# Patient Record
Sex: Male | Born: 1935 | Race: White | Hispanic: No | Marital: Married | Smoking: Former smoker
Health system: Southern US, Community
[De-identification: ages and names within clinical notes are randomized; demographics above are authoritative.]

## PROBLEM LIST (undated history)

## (undated) DIAGNOSIS — I10 Essential (primary) hypertension: Secondary | ICD-10-CM

## (undated) DIAGNOSIS — Z950 Presence of cardiac pacemaker: Secondary | ICD-10-CM

## (undated) DIAGNOSIS — R001 Bradycardia, unspecified: Secondary | ICD-10-CM

## (undated) DIAGNOSIS — F32A Depression, unspecified: Secondary | ICD-10-CM

## (undated) DIAGNOSIS — G20 Parkinson's disease: Secondary | ICD-10-CM

## (undated) DIAGNOSIS — G20A1 Parkinson's disease without dyskinesia, without mention of fluctuations: Secondary | ICD-10-CM

## (undated) DIAGNOSIS — F329 Major depressive disorder, single episode, unspecified: Secondary | ICD-10-CM

## (undated) DIAGNOSIS — I499 Cardiac arrhythmia, unspecified: Secondary | ICD-10-CM

## (undated) DIAGNOSIS — E039 Hypothyroidism, unspecified: Secondary | ICD-10-CM

## (undated) DIAGNOSIS — N189 Chronic kidney disease, unspecified: Secondary | ICD-10-CM

## (undated) DIAGNOSIS — R42 Dizziness and giddiness: Secondary | ICD-10-CM

## (undated) DIAGNOSIS — M1990 Unspecified osteoarthritis, unspecified site: Secondary | ICD-10-CM

## (undated) HISTORY — DX: Dizziness and giddiness: R42

## (undated) HISTORY — PX: HERNIA REPAIR: SHX51

## (undated) HISTORY — PX: CARDIAC CATHETERIZATION: SHX172

## (undated) HISTORY — PX: KNEE SURGERY: SHX244

## (undated) HISTORY — PX: JOINT REPLACEMENT: SHX530

## (undated) HISTORY — PX: SHOULDER SURGERY: SHX246

## (undated) HISTORY — DX: Bradycardia, unspecified: R00.1

## (undated) HISTORY — PX: CATARACT EXTRACTION: SUR2

## (undated) HISTORY — DX: Hypothyroidism, unspecified: E03.9

## (undated) HISTORY — PX: APPENDECTOMY: SHX54

## (undated) HISTORY — PX: INSERT / REPLACE / REMOVE PACEMAKER: SUR710

## (undated) HISTORY — DX: Unspecified osteoarthritis, unspecified site: M19.90

## (undated) HISTORY — PX: OTHER SURGICAL HISTORY: SHX169

---

## 2004-08-19 ENCOUNTER — Ambulatory Visit: Payer: Self-pay | Admitting: Internal Medicine

## 2004-08-20 ENCOUNTER — Ambulatory Visit: Payer: Self-pay | Admitting: Internal Medicine

## 2007-01-10 ENCOUNTER — Emergency Department: Payer: Self-pay | Admitting: Emergency Medicine

## 2007-01-20 ENCOUNTER — Ambulatory Visit: Payer: Self-pay | Admitting: Internal Medicine

## 2007-01-22 ENCOUNTER — Ambulatory Visit: Payer: Self-pay | Admitting: Physician Assistant

## 2007-10-27 ENCOUNTER — Inpatient Hospital Stay: Payer: Self-pay | Admitting: Internal Medicine

## 2007-10-27 ENCOUNTER — Other Ambulatory Visit: Payer: Self-pay

## 2007-10-28 ENCOUNTER — Other Ambulatory Visit: Payer: Self-pay

## 2007-11-02 ENCOUNTER — Other Ambulatory Visit: Payer: Self-pay

## 2007-11-21 ENCOUNTER — Emergency Department: Payer: Self-pay

## 2007-11-21 ENCOUNTER — Other Ambulatory Visit: Payer: Self-pay

## 2009-04-08 ENCOUNTER — Inpatient Hospital Stay: Payer: Self-pay | Admitting: Internal Medicine

## 2011-03-04 ENCOUNTER — Ambulatory Visit: Payer: Self-pay | Admitting: Physician Assistant

## 2011-04-21 ENCOUNTER — Emergency Department: Payer: Self-pay | Admitting: *Deleted

## 2011-04-21 LAB — URINALYSIS, COMPLETE
Bilirubin,UR: NEGATIVE
Blood: NEGATIVE
Leukocyte Esterase: NEGATIVE
Ph: 6 (ref 4.5–8.0)
Protein: NEGATIVE
Squamous Epithelial: 1

## 2011-04-21 LAB — CBC
HCT: 40.9 % (ref 40.0–52.0)
Platelet: 118 10*3/uL — ABNORMAL LOW (ref 150–440)
WBC: 6.5 10*3/uL (ref 3.8–10.6)

## 2011-04-21 LAB — CK TOTAL AND CKMB (NOT AT ARMC)
CK, Total: 83 U/L (ref 35–232)
CK-MB: 0.5 ng/mL (ref 0.5–3.6)

## 2011-04-21 LAB — COMPREHENSIVE METABOLIC PANEL
Anion Gap: 12 (ref 7–16)
Bilirubin,Total: 0.5 mg/dL (ref 0.2–1.0)
Calcium, Total: 9.3 mg/dL (ref 8.5–10.1)
Chloride: 102 mmol/L (ref 98–107)
Co2: 25 mmol/L (ref 21–32)
EGFR (African American): 51 — ABNORMAL LOW
EGFR (Non-African Amer.): 42 — ABNORMAL LOW
Osmolality: 285 (ref 275–301)
Potassium: 4.5 mmol/L (ref 3.5–5.1)
Sodium: 139 mmol/L (ref 136–145)

## 2011-04-21 LAB — TROPONIN I: Troponin-I: <0.02 ng/mL

## 2011-07-15 ENCOUNTER — Other Ambulatory Visit: Payer: Self-pay | Admitting: Neurological Surgery

## 2011-07-15 DIAGNOSIS — M4800 Spinal stenosis, site unspecified: Secondary | ICD-10-CM

## 2011-07-15 DIAGNOSIS — M545 Low back pain: Secondary | ICD-10-CM

## 2011-07-21 ENCOUNTER — Ambulatory Visit
Admission: RE | Admit: 2011-07-21 | Discharge: 2011-07-21 | Disposition: A | Discharge status: Home / Self Care | Payer: No Typology Code available for payment source | Source: Ambulatory Visit | Attending: Neurological Surgery | Admitting: Neurological Surgery

## 2011-07-21 ENCOUNTER — Ambulatory Visit
Admission: RE | Admit: 2011-07-21 | Discharge: 2011-07-21 | Disposition: A | Discharge status: Home / Self Care | Payer: Medicare Other | Source: Ambulatory Visit | Attending: Neurological Surgery | Admitting: Neurological Surgery

## 2011-07-21 VITALS — BP 141/86 | HR 71

## 2011-07-21 DIAGNOSIS — M545 Low back pain: Secondary | ICD-10-CM

## 2011-07-21 DIAGNOSIS — M4800 Spinal stenosis, site unspecified: Secondary | ICD-10-CM

## 2011-07-21 MED ORDER — IOHEXOL 180 MG/ML  SOLN
17.0000 mL | Freq: Once | INTRAMUSCULAR | Status: AC | PRN
  Administered 2011-07-21: 17 mL via INTRATHECAL

## 2011-07-21 MED ORDER — DIAZEPAM 5 MG PO TABS
5.0000 mg | ORAL_TABLET | Freq: Once | ORAL | Status: AC
  Administered 2011-07-21: 5 mg via ORAL

## 2011-07-21 NOTE — Progress Notes (Addendum)
Patient states he has been off Buspirone, Citalopram and Tramadol for the past two days.  Procedure and discharge instructions discussed with patient. jkl

## 2011-07-21 NOTE — Discharge Instructions (Signed)
Myelogram Discharge Instructions  1. Go home and rest quietly for the next 24 hours.  It is important to lie flat for the next 24 hours.  Get up only to go to the restroom.  You may lie in the bed or on a couch on your back, your stomach, your left side or your right side.  You may have one pillow under your head.  You may have pillows between your knees while you are on your side or under your knees while you are on your back.  2. DO NOT drive today.  Recline the seat as far back as it will go, while still wearing your seat belt, on the way home.  3. You may get up to go to the bathroom as needed.  You may sit up for 10 minutes to eat.  You may resume your normal diet and medications unless otherwise indicated.  Drink lots of extra fluids today and tomorrow.  4. The incidence of headache, nausea, or vomiting is about 5% (one in 20 patients).  If you develop a headache, lie flat and drink plenty of fluids until the headache goes away.  Caffeinated beverages may be helpful.  If you develop severe nausea and vomiting or a headache that does not go away with flat bed rest, call [phone].  5. You may resume normal activities after your 24 hours of bed rest is over; however, do not exert yourself strongly or do any heavy lifting tomorrow. If when you get up you have a headache when standing, go back to bed and force fluids for another 24 hours.  6. Call your physician for a follow-up appointment.  The results of your myelogram will be sent directly to your physician by the following day.  7. If you have any questions or if complications develop after you arrive home, please call [phone].  Discharge instructions have been explained to the patient.  The patient, or the person responsible for the patient, fully understands these instructions.       May resume buspirone, citalopram and tramadol on Jul 22, 2011, after 9:30 am.

## 2011-08-03 ENCOUNTER — Other Ambulatory Visit: Payer: Self-pay | Admitting: Neurological Surgery

## 2011-08-03 ENCOUNTER — Encounter (HOSPITAL_COMMUNITY): Payer: Self-pay

## 2011-08-05 ENCOUNTER — Encounter (HOSPITAL_COMMUNITY)
Admission: RE | Admit: 2011-08-05 | Discharge: 2011-08-05 | Disposition: A | Discharge status: Home / Self Care | Payer: Medicare Other | Source: Ambulatory Visit | Attending: Neurological Surgery | Admitting: Neurological Surgery

## 2011-08-05 ENCOUNTER — Encounter (HOSPITAL_COMMUNITY): Payer: Self-pay

## 2011-08-05 HISTORY — DX: Cardiac arrhythmia, unspecified: I49.9

## 2011-08-05 HISTORY — DX: Presence of cardiac pacemaker: Z95.0

## 2011-08-05 HISTORY — DX: Chronic kidney disease, unspecified: N18.9

## 2011-08-05 HISTORY — DX: Major depressive disorder, single episode, unspecified: F32.9

## 2011-08-05 HISTORY — DX: Essential (primary) hypertension: I10

## 2011-08-05 HISTORY — DX: Hypothyroidism, unspecified: E03.9

## 2011-08-05 HISTORY — DX: Depression, unspecified: F32.A

## 2011-08-05 LAB — BASIC METABOLIC PANEL
Chloride: 104 mEq/L (ref 96–112)
Creatinine, Ser: 1.16 mg/dL (ref 0.50–1.35)
GFR calc Af Amer: 68 mL/min — ABNORMAL LOW (ref >90)
GFR calc non Af Amer: 59 mL/min — ABNORMAL LOW (ref >90)
Potassium: 4.1 mEq/L (ref 3.5–5.1)

## 2011-08-05 LAB — CBC
MCHC: 35.2 g/dL (ref 30.0–36.0)
RDW: 13.1 % (ref 11.5–15.5)

## 2011-08-05 LAB — DIFFERENTIAL
Basophils Absolute: 0 10*3/uL (ref 0.0–0.1)
Basophils Relative: 0 % (ref 0–1)
Eosinophils Absolute: 0.3 10*3/uL (ref 0.0–0.7)
Monocytes Absolute: 0.3 10*3/uL (ref 0.1–1.0)
Neutro Abs: 3.1 10*3/uL (ref 1.7–7.7)
Neutrophils Relative %: 51 % (ref 43–77)

## 2011-08-05 LAB — SURGICAL PCR SCREEN: Staphylococcus aureus: NEGATIVE

## 2011-08-05 LAB — PROTIME-INR: Prothrombin Time: 14.4 seconds (ref 11.6–15.2)

## 2011-08-05 MED ORDER — CEFAZOLIN SODIUM-DEXTROSE 2-3 GM-% IV SOLR
2.0000 g | INTRAVENOUS | Status: AC
  Administered 2011-08-06: 2 g via INTRAVENOUS
  Filled 2011-08-05: qty 50

## 2011-08-05 NOTE — Pre-Procedure Instructions (Signed)
[address] ELMAN DETTMAN  08/05/2011   Your procedure is scheduled on:  08/06/11  Report to [facility] at 1240 pm  Call this number if you have problems the morning of surgery: [phone]   Remember:   Do not eat food:After Midnight.  May have clear liquids: up to 4 Hours before arrival.  Clear liquids include soda, tea, black coffee, apple or grape juice, broth.  Take these medicines the morning of surgery with A SIP OF WATER: buspar,celexa,lopid,synthroid,flomax   Do not wear jewelry, make-up or nail polish.  Do not wear lotions, powders, or perfumes. You may wear deodorant.  Do not shave 48 hours prior to surgery. Men may shave face and neck.  Do not bring valuables to the hospital.  Contacts, dentures or bridgework may not be worn into surgery.  Leave suitcase in the car. After surgery it may be brought to your room.  For patients admitted to the hospital, checkout time is 11:00 AM the day of discharge.   Patients discharged the day of surgery will not be allowed to drive home.  Name and phone number of your driver: family  Special Instructions: CHG Shower Use Special Wash: 1/2 bottle night before surgery and 1/2 bottle morning of surgery.   Please read over the following fact sheets that you were given: Pain Booklet, Coughing and Deep Breathing, MRSA Information and Surgical Site Infection Prevention

## 2011-08-05 NOTE — Progress Notes (Signed)
Dr Lady Gary notified for current cardiac studies. Form faxed and [facility] notified.

## 2011-08-06 ENCOUNTER — Inpatient Hospital Stay (HOSPITAL_COMMUNITY): Payer: Medicare Other | Admitting: Certified Registered"

## 2011-08-06 ENCOUNTER — Encounter (HOSPITAL_COMMUNITY): Payer: Self-pay | Admitting: Certified Registered"

## 2011-08-06 ENCOUNTER — Encounter (HOSPITAL_COMMUNITY): Payer: Self-pay | Admitting: *Deleted

## 2011-08-06 ENCOUNTER — Inpatient Hospital Stay (HOSPITAL_COMMUNITY)
Admission: RE | Admit: 2011-08-06 | Discharge: 2011-08-08 | DRG: 491 | Disposition: A | Discharge status: Home / Self Care | Payer: Medicare Other | Source: Ambulatory Visit | Attending: Neurological Surgery | Admitting: Neurological Surgery

## 2011-08-06 ENCOUNTER — Encounter (HOSPITAL_COMMUNITY): Payer: Self-pay | Admitting: Neurological Surgery

## 2011-08-06 ENCOUNTER — Encounter (HOSPITAL_COMMUNITY)
Admission: RE | Disposition: A | Discharge status: Home / Self Care | Payer: Self-pay | Source: Ambulatory Visit | Attending: Neurological Surgery

## 2011-08-06 ENCOUNTER — Inpatient Hospital Stay (HOSPITAL_COMMUNITY): Payer: Medicare Other

## 2011-08-06 DIAGNOSIS — N189 Chronic kidney disease, unspecified: Secondary | ICD-10-CM | POA: Diagnosis present

## 2011-08-06 DIAGNOSIS — I129 Hypertensive chronic kidney disease with stage 1 through stage 4 chronic kidney disease, or unspecified chronic kidney disease: Secondary | ICD-10-CM | POA: Diagnosis present

## 2011-08-06 DIAGNOSIS — Z79899 Other long term (current) drug therapy: Secondary | ICD-10-CM

## 2011-08-06 DIAGNOSIS — M48061 Spinal stenosis, lumbar region without neurogenic claudication: Principal | ICD-10-CM | POA: Diagnosis present

## 2011-08-06 DIAGNOSIS — Z01812 Encounter for preprocedural laboratory examination: Secondary | ICD-10-CM

## 2011-08-06 DIAGNOSIS — F329 Major depressive disorder, single episode, unspecified: Secondary | ICD-10-CM | POA: Diagnosis present

## 2011-08-06 DIAGNOSIS — Z8673 Personal history of transient ischemic attack (TIA), and cerebral infarction without residual deficits: Secondary | ICD-10-CM

## 2011-08-06 DIAGNOSIS — Z950 Presence of cardiac pacemaker: Secondary | ICD-10-CM

## 2011-08-06 DIAGNOSIS — Z7982 Long term (current) use of aspirin: Secondary | ICD-10-CM

## 2011-08-06 DIAGNOSIS — F3289 Other specified depressive episodes: Secondary | ICD-10-CM | POA: Diagnosis present

## 2011-08-06 DIAGNOSIS — Z01810 Encounter for preprocedural cardiovascular examination: Secondary | ICD-10-CM

## 2011-08-06 DIAGNOSIS — E039 Hypothyroidism, unspecified: Secondary | ICD-10-CM | POA: Diagnosis present

## 2011-08-06 DIAGNOSIS — Z87891 Personal history of nicotine dependence: Secondary | ICD-10-CM

## 2011-08-06 HISTORY — PX: LUMBAR LAMINECTOMY/DECOMPRESSION MICRODISCECTOMY: SHX5026

## 2011-08-06 SURGERY — LUMBAR LAMINECTOMY/DECOMPRESSION MICRODISCECTOMY 2 LEVELS
Anesthesia: General | Site: Back

## 2011-08-06 MED ORDER — BUPIVACAINE HCL (PF) 0.25 % IJ SOLN
INTRAMUSCULAR | Status: DC | PRN
  Administered 2011-08-06: 6 mL

## 2011-08-06 MED ORDER — SODIUM CHLORIDE 0.9 % IJ SOLN: 3.0000 mL | INTRAMUSCULAR | Status: DC | PRN

## 2011-08-06 MED ORDER — ACETAMINOPHEN 650 MG RE SUPP: 650.0000 mg | RECTAL | Status: DC | PRN

## 2011-08-06 MED ORDER — EPHEDRINE SULFATE 50 MG/ML IJ SOLN
INTRAMUSCULAR | Status: DC | PRN
  Administered 2011-08-06: 5 mg via INTRAVENOUS
  Administered 2011-08-06: 10 mg via INTRAVENOUS
  Administered 2011-08-06 (×2): 5 mg via INTRAVENOUS
  Administered 2011-08-06: 10 mg via INTRAVENOUS

## 2011-08-06 MED ORDER — SENNA 8.6 MG PO TABS
1.0000 | ORAL_TABLET | Freq: Two times a day (BID) | ORAL | Status: DC
  Administered 2011-08-06 – 2011-08-08 (×4): 8.6 mg via ORAL
  Filled 2011-08-06 (×5): qty 1

## 2011-08-06 MED ORDER — ONDANSETRON HCL 4 MG/2ML IJ SOLN
INTRAMUSCULAR | Status: DC | PRN
  Administered 2011-08-06: 4 mg via INTRAVENOUS

## 2011-08-06 MED ORDER — HYDROCHLOROTHIAZIDE 25 MG PO TABS
12.5000 mg | ORAL_TABLET | Freq: Every day | ORAL | Status: DC
  Administered 2011-08-06 – 2011-08-07 (×2): 12.5 mg via ORAL
  Filled 2011-08-06 (×3): qty 0.5

## 2011-08-06 MED ORDER — FENTANYL CITRATE 0.05 MG/ML IJ SOLN
INTRAMUSCULAR | Status: DC | PRN
  Administered 2011-08-06: 50 ug via INTRAVENOUS
  Administered 2011-08-06 (×2): 100 ug via INTRAVENOUS

## 2011-08-06 MED ORDER — LACTATED RINGERS IV SOLN
INTRAVENOUS | Status: DC | PRN
  Administered 2011-08-06 (×2): via INTRAVENOUS

## 2011-08-06 MED ORDER — ASPIRIN EC 325 MG PO TBEC
325.0000 mg | DELAYED_RELEASE_TABLET | Freq: Every day | ORAL | Status: DC
  Administered 2011-08-06 – 2011-08-08 (×3): 325 mg via ORAL
  Filled 2011-08-06 (×5): qty 1

## 2011-08-06 MED ORDER — HYDROMORPHONE HCL PF 1 MG/ML IJ SOLN
INTRAMUSCULAR | Status: AC
  Filled 2011-08-06: qty 1

## 2011-08-06 MED ORDER — SODIUM CHLORIDE 0.9 % IV SOLN
INTRAVENOUS | Status: AC
  Filled 2011-08-06: qty 500

## 2011-08-06 MED ORDER — NEOSTIGMINE METHYLSULFATE 1 MG/ML IJ SOLN
INTRAMUSCULAR | Status: DC | PRN
  Administered 2011-08-06: 5 mg via INTRAVENOUS

## 2011-08-06 MED ORDER — 0.9 % SODIUM CHLORIDE (POUR BTL) OPTIME
TOPICAL | Status: DC | PRN
  Administered 2011-08-06: 1000 mL

## 2011-08-06 MED ORDER — LISINOPRIL 10 MG PO TABS
10.0000 mg | ORAL_TABLET | Freq: Every day | ORAL | Status: DC
  Administered 2011-08-06 – 2011-08-08 (×3): 10 mg via ORAL
  Filled 2011-08-06 (×3): qty 1

## 2011-08-06 MED ORDER — HEMOSTATIC AGENTS (NO CHARGE) OPTIME
TOPICAL | Status: DC | PRN
  Administered 2011-08-06: 1 via TOPICAL

## 2011-08-06 MED ORDER — DEXAMETHASONE SODIUM PHOSPHATE 4 MG/ML IJ SOLN
4.0000 mg | Freq: Four times a day (QID) | INTRAMUSCULAR | Status: DC
  Administered 2011-08-08: 4 mg via INTRAVENOUS
  Filled 2011-08-06 (×8): qty 1

## 2011-08-06 MED ORDER — PROPOFOL 10 MG/ML IV EMUL
INTRAVENOUS | Status: DC | PRN
  Administered 2011-08-06: 120 mg via INTRAVENOUS

## 2011-08-06 MED ORDER — LIDOCAINE HCL (CARDIAC) 20 MG/ML IV SOLN
INTRAVENOUS | Status: DC | PRN
  Administered 2011-08-06: 40 mg via INTRAVENOUS

## 2011-08-06 MED ORDER — MIDAZOLAM HCL 5 MG/5ML IJ SOLN
INTRAMUSCULAR | Status: DC | PRN
  Administered 2011-08-06: 1 mg via INTRAVENOUS

## 2011-08-06 MED ORDER — MENTHOL 3 MG MT LOZG: 1.0000 | LOZENGE | OROMUCOSAL | Status: DC | PRN

## 2011-08-06 MED ORDER — POTASSIUM CHLORIDE IN NACL 20-0.9 MEQ/L-% IV SOLN
INTRAVENOUS | Status: DC
  Administered 2011-08-06: 20:00:00 via INTRAVENOUS
  Filled 2011-08-06 (×6): qty 1000

## 2011-08-06 MED ORDER — DEXAMETHASONE 4 MG PO TABS
4.0000 mg | ORAL_TABLET | Freq: Four times a day (QID) | ORAL | Status: DC
  Administered 2011-08-06 – 2011-08-08 (×7): 4 mg via ORAL
  Filled 2011-08-06 (×11): qty 1

## 2011-08-06 MED ORDER — SODIUM CHLORIDE 0.9 % IR SOLN
Status: DC | PRN
  Administered 2011-08-06: 15:00:00

## 2011-08-06 MED ORDER — CEFAZOLIN SODIUM 1-5 GM-% IV SOLN
1.0000 g | Freq: Three times a day (TID) | INTRAVENOUS | Status: AC
  Administered 2011-08-06 – 2011-08-07 (×2): 1 g via INTRAVENOUS
  Filled 2011-08-06 (×3): qty 50

## 2011-08-06 MED ORDER — THROMBIN 5000 UNITS EX SOLR
OROMUCOSAL | Status: DC | PRN
  Administered 2011-08-06: 15:00:00 via TOPICAL

## 2011-08-06 MED ORDER — BUSPIRONE HCL 5 MG PO TABS
5.0000 mg | ORAL_TABLET | Freq: Three times a day (TID) | ORAL | Status: DC
  Administered 2011-08-06 – 2011-08-08 (×6): 5 mg via ORAL
  Filled 2011-08-06 (×8): qty 1

## 2011-08-06 MED ORDER — ONDANSETRON HCL 4 MG/2ML IJ SOLN
4.0000 mg | INTRAMUSCULAR | Status: DC | PRN
  Administered 2011-08-08 (×2): 4 mg via INTRAVENOUS
  Filled 2011-08-06 (×2): qty 2

## 2011-08-06 MED ORDER — LEVOTHYROXINE SODIUM 75 MCG PO TABS
75.0000 ug | ORAL_TABLET | Freq: Every day | ORAL | Status: DC
  Administered 2011-08-06 – 2011-08-08 (×3): 75 ug via ORAL
  Filled 2011-08-06 (×3): qty 1

## 2011-08-06 MED ORDER — PHENYLEPHRINE HCL 10 MG/ML IJ SOLN
10.0000 mg | INTRAVENOUS | Status: DC | PRN
  Administered 2011-08-06: 10 ug/min via INTRAVENOUS

## 2011-08-06 MED ORDER — ACETAMINOPHEN 325 MG PO TABS: 650.0000 mg | ORAL_TABLET | ORAL | Status: DC | PRN

## 2011-08-06 MED ORDER — THROMBIN 5000 UNITS EX SOLR
CUTANEOUS | Status: DC | PRN
  Administered 2011-08-06 (×2): 5000 [IU] via TOPICAL

## 2011-08-06 MED ORDER — TAMSULOSIN HCL 0.4 MG PO CAPS
0.4000 mg | ORAL_CAPSULE | Freq: Every day | ORAL | Status: DC
  Administered 2011-08-06 – 2011-08-08 (×3): 0.4 mg via ORAL
  Filled 2011-08-06 (×3): qty 1

## 2011-08-06 MED ORDER — GLYCOPYRROLATE 0.2 MG/ML IJ SOLN
INTRAMUSCULAR | Status: DC | PRN
  Administered 2011-08-06: .8 mg via INTRAVENOUS

## 2011-08-06 MED ORDER — SODIUM CHLORIDE 0.9 % IJ SOLN
3.0000 mL | Freq: Two times a day (BID) | INTRAMUSCULAR | Status: DC
  Administered 2011-08-06 – 2011-08-08 (×4): 3 mL via INTRAVENOUS

## 2011-08-06 MED ORDER — HYDROCODONE-ACETAMINOPHEN 5-325 MG PO TABS
1.0000 | ORAL_TABLET | ORAL | Status: DC | PRN
  Administered 2011-08-06 – 2011-08-08 (×7): 2 via ORAL
  Filled 2011-08-06 (×8): qty 2

## 2011-08-06 MED ORDER — BACITRACIN 50000 UNITS IM SOLR
INTRAMUSCULAR | Status: AC
  Filled 2011-08-06: qty 1

## 2011-08-06 MED ORDER — ROCURONIUM BROMIDE 100 MG/10ML IV SOLN
INTRAVENOUS | Status: DC | PRN
  Administered 2011-08-06: 10 mg via INTRAVENOUS
  Administered 2011-08-06: 50 mg via INTRAVENOUS

## 2011-08-06 MED ORDER — CITALOPRAM HYDROBROMIDE 10 MG PO TABS
10.0000 mg | ORAL_TABLET | Freq: Every day | ORAL | Status: DC
  Administered 2011-08-06 – 2011-08-08 (×3): 10 mg via ORAL
  Filled 2011-08-06 (×3): qty 1

## 2011-08-06 MED ORDER — PHENOL 1.4 % MT LIQD: 1.0000 | OROMUCOSAL | Status: DC | PRN

## 2011-08-06 MED ORDER — MORPHINE SULFATE 2 MG/ML IJ SOLN
1.0000 mg | INTRAMUSCULAR | Status: DC | PRN
  Administered 2011-08-06: 4 mg via INTRAVENOUS
  Administered 2011-08-06: 2 mg via INTRAVENOUS
  Filled 2011-08-06: qty 2
  Filled 2011-08-06: qty 1

## 2011-08-06 MED ORDER — HYDROMORPHONE HCL PF 1 MG/ML IJ SOLN
0.2500 mg | INTRAMUSCULAR | Status: DC | PRN
  Administered 2011-08-06: 0.25 mg via INTRAVENOUS

## 2011-08-06 MED ORDER — ONDANSETRON HCL 4 MG/2ML IJ SOLN: 4.0000 mg | Freq: Once | INTRAMUSCULAR | Status: DC | PRN

## 2011-08-06 SURGICAL SUPPLY — 47 items
APL SKNCLS STERI-STRIP NONHPOA (GAUZE/BANDAGES/DRESSINGS) ×1
BAG DECANTER FOR FLEXI CONT (MISCELLANEOUS) ×2 IMPLANT
BENZOIN TINCTURE PRP APPL 2/3 (GAUZE/BANDAGES/DRESSINGS) ×2 IMPLANT
BUR MATCHSTICK NEURO 3.0 LAGG (BURR) ×2 IMPLANT
CANISTER SUCTION 2500CC (MISCELLANEOUS) ×2 IMPLANT
CLOTH BEACON ORANGE TIMEOUT ST (SAFETY) ×2 IMPLANT
CONT SPEC 4OZ CLIKSEAL STRL BL (MISCELLANEOUS) ×2 IMPLANT
DRAPE LAPAROTOMY 100X72X124 (DRAPES) ×2 IMPLANT
DRAPE MICROSCOPE ZEISS OPMI (DRAPES) ×1 IMPLANT
DRAPE POUCH INSTRU U-SHP 10X18 (DRAPES) ×2 IMPLANT
DRAPE SURG 17X23 STRL (DRAPES) ×2 IMPLANT
DRESSING TELFA 8X3 (GAUZE/BANDAGES/DRESSINGS) ×2 IMPLANT
DRSG OPSITE 4X5.5 SM (GAUZE/BANDAGES/DRESSINGS) ×2 IMPLANT
DURAPREP 26ML APPLICATOR (WOUND CARE) ×2 IMPLANT
ELECT REM PT RETURN 9FT ADLT (ELECTROSURGICAL) ×2
ELECTRODE REM PT RTRN 9FT ADLT (ELECTROSURGICAL) ×1 IMPLANT
GAUZE SPONGE 4X4 16PLY XRAY LF (GAUZE/BANDAGES/DRESSINGS) IMPLANT
GLOVE BIO SURGEON STRL SZ8 (GLOVE) ×2 IMPLANT
GLOVE BIOGEL PI IND STRL 7.0 (GLOVE) IMPLANT
GLOVE BIOGEL PI INDICATOR 7.0 (GLOVE) ×1
GLOVE SURG SS PI 6.5 STRL IVOR (GLOVE) ×2 IMPLANT
GOWN BRE IMP SLV AUR LG STRL (GOWN DISPOSABLE) ×2 IMPLANT
GOWN BRE IMP SLV AUR XL STRL (GOWN DISPOSABLE) ×2 IMPLANT
GOWN STRL REIN 2XL LVL4 (GOWN DISPOSABLE) IMPLANT
HEMOSTAT POWDER KIT SURGIFOAM (HEMOSTASIS) IMPLANT
KIT BASIN OR (CUSTOM PROCEDURE TRAY) ×2 IMPLANT
KIT ROOM TURNOVER OR (KITS) ×2 IMPLANT
NDL HYPO 25X1 1.5 SAFETY (NEEDLE) ×1 IMPLANT
NDL SPNL 20GX3.5 QUINCKE YW (NEEDLE) IMPLANT
NEEDLE HYPO 22GX1.5 SAFETY (NEEDLE) ×1 IMPLANT
NEEDLE HYPO 25X1 1.5 SAFETY (NEEDLE) ×2 IMPLANT
NEEDLE SPNL 20GX3.5 QUINCKE YW (NEEDLE) ×2 IMPLANT
NS IRRIG 1000ML POUR BTL (IV SOLUTION) ×2 IMPLANT
PACK LAMINECTOMY NEURO (CUSTOM PROCEDURE TRAY) ×2 IMPLANT
PAD ARMBOARD 7.5X6 YLW CONV (MISCELLANEOUS) ×5 IMPLANT
RUBBERBAND STERILE (MISCELLANEOUS) ×2 IMPLANT
SPONGE SURGIFOAM ABS GEL SZ50 (HEMOSTASIS) ×2 IMPLANT
STRIP CLOSURE SKIN 1/2X4 (GAUZE/BANDAGES/DRESSINGS) ×2 IMPLANT
STRIP CLOSURE SKIN 1/4X4 (GAUZE/BANDAGES/DRESSINGS) ×1 IMPLANT
SUT VIC AB 0 CT1 18XCR BRD8 (SUTURE) ×1 IMPLANT
SUT VIC AB 0 CT1 8-18 (SUTURE) ×2
SUT VIC AB 2-0 CP2 18 (SUTURE) ×2 IMPLANT
SUT VIC AB 3-0 SH 8-18 (SUTURE) ×2 IMPLANT
SYR 20ML ECCENTRIC (SYRINGE) ×2 IMPLANT
TOWEL OR 17X24 6PK STRL BLUE (TOWEL DISPOSABLE) ×2 IMPLANT
TOWEL OR 17X26 10 PK STRL BLUE (TOWEL DISPOSABLE) ×2 IMPLANT
WATER STERILE IRR 1000ML POUR (IV SOLUTION) ×2 IMPLANT

## 2011-08-06 NOTE — Anesthesia Preprocedure Evaluation (Addendum)
Anesthesia Evaluation  Patient identified by MRN, date of birth, ID band Patient awake    Reviewed: Allergy & Precautions, NPO status   Airway Mallampati: I  Neck ROM: Full    Dental  (+) Edentulous Upper, Edentulous Lower and Dental Advisory Given   Pulmonary  breath sounds clear to auscultation        Cardiovascular hypertension, Pt. on medications + dysrhythmias + pacemaker Rhythm:Regular Rate:Normal     Neuro/Psych PSYCHIATRIC DISORDERS Depression    GI/Hepatic   Endo/Other  Hypothyroidism   Renal/GU      Musculoskeletal   Abdominal   Peds  Hematology   Anesthesia Other Findings   Reproductive/Obstetrics                         Anesthesia Physical Anesthesia Plan  ASA: III  Anesthesia Plan: General   Post-op Pain Management:    Induction: Intravenous  Airway Management Planned: Oral ETT  Additional Equipment:   Intra-op Plan:   Post-operative Plan: Extubation in OR  Informed Consent: I have reviewed the patients History and Physical, chart, labs and discussed the procedure including the risks, benefits and alternatives for the proposed anesthesia with the patient or authorized representative who has indicated his/her understanding and acceptance.   Dental advisory given  Plan Discussed with:   Anesthesia Plan Comments: (Afib Htn Lumbar spinal stenosis  Plan GA)        Anesthesia Quick Evaluation

## 2011-08-06 NOTE — Preoperative (Signed)
Beta Blockers   Reason not to administer Beta Blockers:Not Applicable 

## 2011-08-06 NOTE — Anesthesia Postprocedure Evaluation (Signed)
  Anesthesia Post-op Note  Patient: Lawrence Valdez  Procedure(s) Performed: Procedure(s) (LRB): LUMBAR LAMINECTOMY/DECOMPRESSION MICRODISCECTOMY 2 LEVELS (N/A)  Patient Location: PACU  Anesthesia Type: General  Level of Consciousness: awake, alert  and oriented  Airway and Oxygen Therapy: Patient Spontanous Breathing and Patient connected to nasal cannula oxygen  Post-op Pain: mild  Post-op Assessment: Post-op Vital signs reviewed and Patient's Cardiovascular Status Stable  Post-op Vital Signs: stable  Complications: No apparent anesthesia complications

## 2011-08-06 NOTE — Transfer of Care (Signed)
Immediate Anesthesia Transfer of Care Note  Patient: Lawrence Valdez  Procedure(s) Performed: Procedure(s) (LRB): LUMBAR LAMINECTOMY/DECOMPRESSION MICRODISCECTOMY 2 LEVELS (N/A)  Patient Location: PACU  Anesthesia Type: General  Level of Consciousness: awake, alert  and oriented  Airway & Oxygen Therapy: Patient Spontanous Breathing and Patient connected to nasal cannula oxygen  Post-op Assessment: Report given to PACU RN, Post -op Vital signs reviewed and stable and Patient moving all extremities X 4  Post vital signs: Reviewed and stable  Complications: No apparent anesthesia complications

## 2011-08-06 NOTE — Progress Notes (Signed)
In and Out was performed  without difficulty.   With the result of 600 ml of clear yellow urine noted Patient tolerated procedure well -Rn will continue to monitor

## 2011-08-06 NOTE — H&P (Signed)
Subjective: Patient is a 78 y.o. male admitted for DLL L2-3 L3-4. Onset of symptoms was several months ago, gradually worsening since that time.  The pain is rated moderate, and is located at the across the lower back and radiates to L > R leg. The pain is described as throbbing and occurs intermittently. The symptoms have been progressive. Symptoms are exacerbated by exercise. MRI or CT showed stenosis.   Past Medical History  Diagnosis Date  . Hypertension   . Pacemaker   . Hypothyroidism   . Chronic kidney disease     enlarged prostrate  . Depression   . Dysrhythmia     Past Surgical History  Procedure Date  . Insert / replace / remove pacemaker     dr Lady Gary  [employer]     09  . Cardiac catheterization     09  . Joint replacement     lt knee  . Appendectomy   . Thumbs     trigger thumbs  . Cataract extraction     Prior to Admission medications   Medication Sig Start Date End Date Taking? Authorizing Provider  aspirin EC 325 MG tablet Take 325 mg by mouth daily.   Yes Historical Provider, MD  busPIRone (BUSPAR) 5 MG tablet Take 5 mg by mouth 3 (three) times daily.   Yes Historical Provider, MD  cholecalciferol (VITAMIN D) 1000 UNITS tablet Take 4,000 Units by mouth daily.   Yes Historical Provider, MD  citalopram (CELEXA) 10 MG tablet Take 10 mg by mouth daily.   Yes Historical Provider, MD  gemfibrozil (LOPID) 600 MG tablet Take 600 mg by mouth daily.   Yes Historical Provider, MD  hydrochlorothiazide (HYDRODIURIL) 12.5 MG tablet Take 12.5 mg by mouth daily.   Yes Historical Provider, MD  levothyroxine (SYNTHROID, LEVOTHROID) 75 MCG tablet Take 75 mcg by mouth daily.   Yes Historical Provider, MD  lisinopril (PRINIVIL,ZESTRIL) 10 MG tablet Take 10 mg by mouth daily.   Yes Historical Provider, MD  loratadine (CLARITIN) 10 MG tablet Take 10 mg by mouth daily.   Yes Historical Provider, MD  meclizine (ANTIVERT) 25 MG tablet Take 25 mg by mouth 3 (three) times daily as needed.  For dizziness   Yes Historical Provider, MD  Multiple Vitamin (MULITIVITAMIN WITH MINERALS) TABS Take 1 tablet by mouth daily.   Yes Historical Provider, MD  polyethylene glycol (MIRALAX / GLYCOLAX) packet Take 17 g by mouth at bedtime.   Yes Historical Provider, MD  Tamsulosin HCl (FLOMAX) 0.4 MG CAPS Take 0.4 mg by mouth daily.   Yes Historical Provider, MD   No Known Allergies  History  Substance Use Topics  . Smoking status: Former Smoker    Quit date: 08/04/1961  . Smokeless tobacco: Not on file  . Alcohol Use: Yes     socially    History reviewed. No pertinent family history.   Review of Systems  Positive ROS: neg  All other systems have been reviewed and were otherwise negative with the exception of those mentioned in the HPI and as above.  Objective: Vital signs in last 24 hours: Temp:  [98 F (36.7 C)] 98 F (36.7 C) (05/16 1155) Pulse Rate:  [70] 70  (05/16 1155) Resp:  [18] 18  (05/16 1155) BP: (116)/(79) 116/79 mmHg (05/16 1155) SpO2:  [95 %] 95 % (05/16 1155)  General Appearance: Alert, cooperative, no distress, appears stated age Head: Normocephalic, without obvious abnormality, atraumatic Eyes: PERRL, conjunctiva/corneas clear, EOM's intact, fundi benign, both  eyes      Ears: Normal TM's and external ear canals, both ears Throat: Lips, mucosa, and tongue normal; teeth and gums normal Neck: Supple, symmetrical, trachea midline, no adenopathy; thyroid: No enlargement/tenderness/nodules; no carotid bruit or JVD Back: Symmetric, no curvature, ROM normal, no CVA tenderness Lungs: Clear to auscultation bilaterally, respirations unlabored Heart: Regular rate and rhythm, S1 and S2 normal, no murmur, rub or gallop Abdomen: Soft, non-tender, bowel sounds active all four quadrants, no masses, no organomegaly Extremities: Extremities normal, atraumatic, no cyanosis or edema Pulses: 2+ and symmetric all extremities Skin: Skin color, texture, turgor normal, no rashes or  lesions  NEUROLOGIC:   Mental status: Alert and oriented x4,  no aphasia, good attention span, fund of knowledge, and memory Motor Exam - grossly normal Sensory Exam - grossly normal Reflexes: 1+ Coordination - grossly normal Gait - grossly normal Balance - grossly normal Cranial Nerves: I: smell Not tested  II: visual acuity  OS: nl    OD: nl  II: visual fields Full to confrontation  II: pupils Equal, round, reactive to light  III,VII: ptosis None  III,IV,VI: extraocular muscles  Full ROM  V: mastication Normal  V: facial light touch sensation  Normal  V,VII: corneal reflex  Present  VII: facial muscle function - upper  Normal  VII: facial muscle function - lower Normal  VIII: hearing Not tested  IX: soft palate elevation  Normal  IX,X: gag reflex Present  XI: trapezius strength  5/5  XI: sternocleidomastoid strength 5/5  XI: neck flexion strength  5/5  XII: tongue strength  Normal    Data Review Lab Results  Component Value Date   WBC 6.1 08/05/2011   HGB 14.6 08/05/2011   HCT 41.5 08/05/2011   MCV 89.2 08/05/2011   PLT 128* 08/05/2011   Lab Results  Component Value Date   NA 140 08/05/2011   K 4.1 08/05/2011   CL 104 08/05/2011   CO2 25 08/05/2011   BUN 21 08/05/2011   CREATININE 1.16 08/05/2011   GLUCOSE 94 08/05/2011   Lab Results  Component Value Date   INR 1.10 08/05/2011    Assessment/Plan: Patient admitted for [facility] for stenosis. Patient has failed conservative therapy.  I explained the condition and procedure to the patient and answered any questions.  Patient wishes to proceed with procedure as planned. Understands risks/ benefits and typical outcomes of procedure.   Znya Albino S 08/06/2011 1:18 PM

## 2011-08-06 NOTE — Op Note (Signed)
08/06/2011  3:36 PM  PATIENT:  Lawrence Valdez  78 y.o. male  PRE-OPERATIVE DIAGNOSIS: Lumbar spinal stenosis L2-3 L3-4 with back and leg pain  POST-OPERATIVE DIAGNOSIS:  Same  PROCEDURE:  Decompressive lumbar laminectomy medial facetectomy and foraminotomies L2-3 and L3-4 bilaterally  SURGEON:  Marikay Alar, MD  ASSISTANTS: None  ANESTHESIA:   General  EBL: 100 ml  Total I/O In: 1200 [I.V.:1200] Out: 100 [Blood:100]  BLOOD ADMINISTERED:none  DRAINS: None   SPECIMEN:  No Specimen  INDICATION FOR PROCEDURE: This patient presented with back and leg pain left greater than right. CT myelogram showed spinal stenosis at L2-3 and L3-4. He tried medical management without relief. I recommended a decompressive laminectomy L2-3 and L3-4. Patient understood the risks, benefits, and alternatives and potential outcomes and wished to proceed.  PROCEDURE DETAILS: The patient was taken to the operating room and after induction of adequate generalized endotracheal anesthesia, the patient was rolled into the prone position on the Wilson frame and all pressure points were padded. The lumbar region was cleaned and then prepped with DuraPrep and draped in the usual sterile fashion. 5 cc of local anesthesia was injected and then a dorsal midline incision was made and carried down to the lumbo sacral fascia. The fascia was opened and the paraspinous musculature was taken down in a subperiosteal fashion to expose L2-3 and L3-4 bilaterally. Intraoperative x-ray confirmed my level, and then I used a combination of the high-speed drill and the Kerrison punches to perform a hemilaminectomy, medial facetectomy, and foraminotomy at L2-3 and L3-4 bilaterally. The underlying yellow ligament was opened and removed in a piecemeal fashion to expose the underlying dura and exiting nerve roots. I undercut the lateral recess and dissected down until I was medial to and distal to the L2 and L3 pedicle. The nerve root was  well decompressed at each level bilaterally. We then gently retracted the nerve root medially with a retractor in nature there is no herniated disc at L2-3 or L3-4 on the left. I then palpated with a coronary dilator along the nerve root and into the foramen to assure adequate decompression. I felt no more compression of the nerve root. I irrigated with saline solution containing bacitracin. Achieved hemostasis with bipolar cautery, lined the dura with Gelfoam, and then closed the fascia with 0 Vicryl. I closed the subcutaneous tissues with 2-0 Vicryl and the subcuticular tissues with 3-0 Vicryl. The skin was then closed with benzoin and Steri-Strips. The drapes were removed, a sterile dressing was applied. The patient was awakened from general anesthesia and transferred to the recovery room in stable condition. At the end of the procedure all sponge, needle and instrument counts were correct.   PLAN OF CARE: Admit to inpatient   PATIENT DISPOSITION:  PACU - hemodynamically stable.   Delay start of Pharmacological VTE agent (>24hrs) due to surgical blood loss or risk of bleeding:  yes

## 2011-08-07 ENCOUNTER — Encounter (HOSPITAL_COMMUNITY): Payer: Self-pay | Admitting: *Deleted

## 2011-08-07 MED ORDER — ALUM & MAG HYDROXIDE-SIMETH 200-200-20 MG/5ML PO SUSP
30.0000 mL | Freq: Four times a day (QID) | ORAL | Status: DC | PRN
  Administered 2011-08-07 – 2011-08-08 (×3): 30 mL via ORAL
  Filled 2011-08-07 (×3): qty 30

## 2011-08-07 NOTE — Discharge Instructions (Signed)
Wound Care Keep incision covered and dry for one week.  If you shower prior to then, cover incision with plastic wrap.  You may remove outer bandage after one week and shower.  Do not put any creams, lotions, or ointments on incision. Leave steri-strips on back.  They will fall off by themselves. Activity Walk each and every day, increasing distance each day. No lifting greater than 5 lbs.  Avoid bending, arching, or twisting. No driving for 2 weeks; may ride as a passenger locally.  Diet Resume your normal diet.  Return to Work Will be discussed at you follow up appointment. Call Your Doctor If Any of These Occur Redness, drainage, or swelling at the wound.  Temperature greater than 101 degrees. Severe pain not relieved by pain medication. Incision starts to come apart. Follow Up Appt Call today for appointment in 1-2 weeks ([phone]) or for problems.  If you have any hardware placed in your spine, you will need an x-ray before your appointment.

## 2011-08-07 NOTE — Progress Notes (Signed)
Patient ID: Lawrence Valdez, male   DOB: 09-05-1931, 78 y.o.   MRN: [id number] Subjective: Patient reports he's doing great except urinary retention. No leg pain , walking better.    Objective: Vital signs in last 24 hours: Temp:  [97 F (36.1 C)-98.1 F (36.7 C)] 98 F (36.7 C) (05/17 0353) Pulse Rate:  [65-88] 85  (05/17 0353) Resp:  [14-20] 14  (05/17 0353) BP: (91-119)/(61-79) 95/62 mmHg (05/17 0353) SpO2:  [93 %-97 %] 93 % (05/17 0353)  Intake/Output from previous day: 05/16 0701 - 05/17 0700 In: 1200 [I.V.:1200] Out: 700 [Urine:600; Blood:100] Intake/Output this shift:    Neurologic: Grossly normal  Lab Results: Lab Results  Component Value Date   WBC 6.1 08/05/2011   HGB 14.6 08/05/2011   HCT 41.5 08/05/2011   MCV 89.2 08/05/2011   PLT 128* 08/05/2011   Lab Results  Component Value Date   INR 1.10 08/05/2011   BMET Lab Results  Component Value Date   NA 140 08/05/2011   K 4.1 08/05/2011   CL 104 08/05/2011   CO2 25 08/05/2011   GLUCOSE 94 08/05/2011   BUN 21 08/05/2011   CREATININE 1.16 08/05/2011   CALCIUM 9.6 08/05/2011    Studies/Results: Dg Chest 2 View  08/05/2011  *RADIOLOGY REPORT*  Clinical Data: Hypertension.  CHEST - 2 VIEW  Comparison: None.  Findings: Lungs are clear.  Heart size is normal.  No pneumothorax or pleural fluid.  Pacing device noted.  IMPRESSION: No acute finding.  Original Report Authenticated By: Bernadene Bell. D'ALESSIO, M.D.   Dg Lumbar Spine 2-3 Views  08/06/2011  *RADIOLOGY REPORT*  Clinical Data: Back pain  LUMBAR SPINE - 2-3 VIEW  Comparison: 07/21/2011 myelogram  Findings: Film #1 demonstrates a needle directed most closely toward L4-L5.  Film #2 demonstrates an angled probe from posterior approach directed upward most closely toward L3-L4 interspace.  IMPRESSION: As above.  Original Report Authenticated By: Elsie Stain, M.D.    Assessment/Plan: Doing well. Foley for 24 hrs, flomax.    LOS: 1 day    Lahela Woodin S 08/07/2011,  8:10 AM

## 2011-08-07 NOTE — Progress Notes (Signed)
Utilization review completed. Brytney Somes, RN, BSN.  08/07/11   

## 2011-08-07 NOTE — Care Management Note (Signed)
    Page 1 of 1   08/07/2011     4:02:32 PM   CARE MANAGEMENT NOTE 08/07/2011  Patient:  Lawrence Valdez, Lawrence Valdez   Account Number:  [id number]  Date Initiated:  08/07/2011  Documentation initiated by:  Anette Guarneri  Subjective/Objective Assessment:   POD#1 s/p laminiectomy/foraminotomy  voiding difficulty  ambulating well     Action/Plan:   d/c home with self care   Anticipated DC Date:  08/08/2011   Anticipated DC Plan:  HOME/SELF CARE         Choice offered to / List presented to:             Status of service:  Completed, signed off Medicare Important Message given?   (If response is "NO", the following Medicare IM given date fields will be blank) Date Medicare IM given:   Date Additional Medicare IM given:    Discharge Disposition:    Per UR Regulation:  Reviewed for med. necessity/level of care/duration of stay  If discussed at Long Length of Stay Meetings, dates discussed:    Comments:

## 2011-08-08 MED ORDER — HYDROCHLOROTHIAZIDE 12.5 MG PO CAPS
12.5000 mg | ORAL_CAPSULE | Freq: Every day | ORAL | Status: DC
  Administered 2011-08-08: 12.5 mg via ORAL
  Filled 2011-08-08: qty 1

## 2011-08-08 NOTE — Progress Notes (Signed)
Still some drainage in wound. Also trouble  With urination vomited last nite. Ambulating. No weakness Plan  to 3000

## 2011-08-08 NOTE — Discharge Summary (Signed)
Physician Discharge Summary  Patient ID: Lawrence Valdez MRN: [id number] DOB/AGE: 11/17/1931 78 y.o.  Admit date: 08/06/2011 Discharge date: 08/08/2011  Admission Diagnoses:lumbar stenosis  Discharge Diagnoses: same   Discharged Condition:no pain. ambulating  Hospital Course:surgery  Consults: none  Significant Diagnostic Studies: mri  Treatments: surgery  Discharge Exam: Blood pressure 103/62, pulse 78, temperature 98.6 F (37 C), temperature source Oral, resp. rate 18, SpO2 93.00%. Ambulating. Voided. No more n/v. Some drainage in wound. WANTS TO GO HOME. A relative is to change dressigs prn   Disposition: home. To call if grainage increase   Medication List  As of 08/08/2011  3:15 PM   ASK your doctor about these medications         aspirin EC 325 MG tablet   Take 325 mg by mouth daily.      busPIRone 5 MG tablet   Commonly known as: BUSPAR   Take 5 mg by mouth 3 (three) times daily.      cholecalciferol 1000 UNITS tablet   Commonly known as: VITAMIN D   Take 4,000 Units by mouth daily.      citalopram 10 MG tablet   Commonly known as: CELEXA   Take 10 mg by mouth daily.      gemfibrozil 600 MG tablet   Commonly known as: LOPID   Take 600 mg by mouth daily.      hydrochlorothiazide 12.5 MG tablet   Commonly known as: HYDRODIURIL   Take 12.5 mg by mouth daily.      levothyroxine 75 MCG tablet   Commonly known as: SYNTHROID, LEVOTHROID   Take 75 mcg by mouth daily.      lisinopril 10 MG tablet   Commonly known as: PRINIVIL,ZESTRIL   Take 10 mg by mouth daily.      loratadine 10 MG tablet   Commonly known as: CLARITIN   Take 10 mg by mouth daily.      meclizine 25 MG tablet   Commonly known as: ANTIVERT   Take 25 mg by mouth 3 (three) times daily as needed. For dizziness      mulitivitamin with minerals Tabs   Take 1 tablet by mouth daily.      polyethylene glycol packet   Commonly known as: MIRALAX / GLYCOLAX   Take 17 g by mouth at  bedtime.      Tamsulosin HCl 0.4 MG Caps   Commonly known as: FLOMAX   Take 0.4 mg by mouth daily.             Signed: Karn Cassis 08/08/2011, 3:15 PM

## 2011-08-12 ENCOUNTER — Encounter (HOSPITAL_COMMUNITY): Payer: Self-pay

## 2011-09-01 ENCOUNTER — Encounter (HOSPITAL_BASED_OUTPATIENT_CLINIC_OR_DEPARTMENT_OTHER): Payer: Medicare Other | Attending: General Surgery

## 2011-09-01 DIAGNOSIS — I10 Essential (primary) hypertension: Secondary | ICD-10-CM | POA: Insufficient documentation

## 2011-09-01 DIAGNOSIS — G589 Mononeuropathy, unspecified: Secondary | ICD-10-CM | POA: Insufficient documentation

## 2011-09-01 DIAGNOSIS — Z96659 Presence of unspecified artificial knee joint: Secondary | ICD-10-CM | POA: Insufficient documentation

## 2011-09-01 DIAGNOSIS — Z950 Presence of cardiac pacemaker: Secondary | ICD-10-CM | POA: Insufficient documentation

## 2011-09-01 DIAGNOSIS — S21209A Unspecified open wound of unspecified back wall of thorax without penetration into thoracic cavity, initial encounter: Secondary | ICD-10-CM | POA: Insufficient documentation

## 2011-09-01 DIAGNOSIS — E0590 Thyrotoxicosis, unspecified without thyrotoxic crisis or storm: Secondary | ICD-10-CM | POA: Insufficient documentation

## 2011-09-01 DIAGNOSIS — Y838 Other surgical procedures as the cause of abnormal reaction of the patient, or of later complication, without mention of misadventure at the time of the procedure: Secondary | ICD-10-CM | POA: Insufficient documentation

## 2011-09-01 DIAGNOSIS — Z79899 Other long term (current) drug therapy: Secondary | ICD-10-CM | POA: Insufficient documentation

## 2011-09-01 NOTE — Progress Notes (Signed)
[facility]  NAME:  Lawrence Valdez, Lawrence Valdez NO.:  [id number]  MEDICAL RECORD NO.:  [id number]      DATE OF BIRTH:  Jun 04, 1931  PHYSICIAN:  Ardath Sax, M.D.           VISIT DATE:                                  OFFICE VISIT   This is a 78 year old gentleman who had a laminectomy about a month ago and has a wound that has had some drainage and some delay in healing. It is about 3 cm long and has really only about a millimeter separation but it has some devascularized skin and fat that extends down about 3 mm.  I debrided this today and we are treating it with silver alginate and have him change this dressing every day at home.  There is a daughter with him that will help him do this.  He is on many medicines. He has a history of hypertension.  He has got a pacemaker.  He has also had arthroscopy of his right knee.  He is on hydrochlorothiazide, levothyroxine, buspirone, lisinopril, aspirin, Flomax, oxycodone, meclizine, and gemfibrozil.  He had a blood pressure 120/80, pulse 70, respirations 16, temperature 98.6.  He looks older than his age.  He is somewhat obese but he is alert with his family and on exam the only obvious problem he has is the open wound over his lumbar spine from the laminectomy.  His ear, nose, throat is okay and his lungs sound clear. He does have a pacemaker but his sinus rhythm with the pacemaker 70.  We packed the wound with silver alginate and he will come back next week.     Ardath Sax, M.D.     PP/MEDQ  D:  09/01/2011  T:  09/01/2011  Job:  [id number]

## 2011-09-23 ENCOUNTER — Encounter (HOSPITAL_BASED_OUTPATIENT_CLINIC_OR_DEPARTMENT_OTHER): Payer: Medicare Other | Attending: General Surgery

## 2011-09-23 DIAGNOSIS — Y838 Other surgical procedures as the cause of abnormal reaction of the patient, or of later complication, without mention of misadventure at the time of the procedure: Secondary | ICD-10-CM | POA: Insufficient documentation

## 2011-09-23 DIAGNOSIS — T81329A Deep disruption or dehiscence of operation wound, unspecified, initial encounter: Secondary | ICD-10-CM | POA: Insufficient documentation

## 2011-09-23 DIAGNOSIS — T8132XA Disruption of internal operation (surgical) wound, not elsewhere classified, initial encounter: Secondary | ICD-10-CM | POA: Insufficient documentation

## 2011-09-25 ENCOUNTER — Other Ambulatory Visit: Payer: Self-pay | Admitting: Neurological Surgery

## 2011-09-25 DIAGNOSIS — M549 Dorsalgia, unspecified: Secondary | ICD-10-CM

## 2011-09-29 ENCOUNTER — Encounter (HOSPITAL_BASED_OUTPATIENT_CLINIC_OR_DEPARTMENT_OTHER): Payer: Medicare Other

## 2011-10-01 ENCOUNTER — Ambulatory Visit
Admission: RE | Admit: 2011-10-01 | Discharge: 2011-10-01 | Disposition: A | Discharge status: Home / Self Care | Payer: Medicare Other | Source: Ambulatory Visit | Attending: Neurological Surgery | Admitting: Neurological Surgery

## 2011-10-01 VITALS — BP 146/86 | HR 70

## 2011-10-01 DIAGNOSIS — M549 Dorsalgia, unspecified: Secondary | ICD-10-CM

## 2011-10-01 MED ORDER — DIAZEPAM 5 MG PO TABS
5.0000 mg | ORAL_TABLET | Freq: Once | ORAL | Status: AC
  Administered 2011-10-01: 5 mg via ORAL

## 2011-10-01 MED ORDER — IOHEXOL 180 MG/ML  SOLN
15.0000 mL | Freq: Once | INTRAMUSCULAR | Status: AC | PRN
  Administered 2011-10-01: 15 mL via INTRATHECAL

## 2011-10-01 NOTE — Progress Notes (Signed)
Pt states he has been off celexa and buspar for the past 2 days.

## 2012-02-23 ENCOUNTER — Ambulatory Visit: Payer: Self-pay | Admitting: Family Medicine

## 2012-03-21 ENCOUNTER — Inpatient Hospital Stay: Payer: Self-pay | Admitting: Internal Medicine

## 2012-03-21 LAB — CBC WITH DIFFERENTIAL/PLATELET
Basophil %: 0.5 %
Eosinophil #: 0.2 10*3/uL (ref 0.0–0.7)
Eosinophil %: 3.4 %
HCT: 38 % — ABNORMAL LOW (ref 40.0–52.0)
HGB: 12.9 g/dL — ABNORMAL LOW (ref 13.0–18.0)
MCH: 30 pg (ref 26.0–34.0)
MCHC: 34 g/dL (ref 32.0–36.0)
MCV: 88 fL (ref 80–100)
Monocyte #: 0.4 x10 3/mm (ref 0.2–1.0)
Neutrophil #: 2.4 10*3/uL (ref 1.4–6.5)
RBC: 4.31 10*6/uL — ABNORMAL LOW (ref 4.40–5.90)

## 2012-03-21 LAB — COMPREHENSIVE METABOLIC PANEL
Albumin: 3.3 g/dL — ABNORMAL LOW (ref 3.4–5.0)
Alkaline Phosphatase: 66 U/L (ref 50–136)
Anion Gap: 5 — ABNORMAL LOW (ref 7–16)
BUN: 13 mg/dL (ref 7–18)
Calcium, Total: 8.7 mg/dL (ref 8.5–10.1)
Creatinine: 1.32 mg/dL — ABNORMAL HIGH (ref 0.60–1.30)
Glucose: 141 mg/dL — ABNORMAL HIGH (ref 65–99)
SGOT(AST): 33 U/L (ref 15–37)

## 2012-03-21 LAB — TROPONIN I: Troponin-I: <0.02 ng/mL

## 2012-03-21 LAB — CK TOTAL AND CKMB (NOT AT ARMC): CK-MB: 1 ng/mL (ref 0.5–3.6)

## 2012-03-22 LAB — CBC WITH DIFFERENTIAL/PLATELET
Basophil %: 0.3 %
Eosinophil #: 0.3 10*3/uL (ref 0.0–0.7)
HGB: 12.9 g/dL — ABNORMAL LOW (ref 13.0–18.0)
MCH: 31.2 pg (ref 26.0–34.0)
MCHC: 35.1 g/dL (ref 32.0–36.0)
Monocyte #: 0.4 x10 3/mm (ref 0.2–1.0)
Neutrophil %: 34.1 %

## 2012-03-22 LAB — LIPID PANEL
Cholesterol: 139 mg/dL (ref 0–200)
Ldl Cholesterol, Calc: 90 mg/dL (ref 0–100)

## 2012-03-22 LAB — URINALYSIS, COMPLETE
Bacteria: NONE SEEN
Glucose,UR: NEGATIVE mg/dL (ref 0–75)
Ketone: NEGATIVE
Nitrite: NEGATIVE
Protein: NEGATIVE
Specific Gravity: 1.013 (ref 1.003–1.030)

## 2012-03-22 LAB — BASIC METABOLIC PANEL
BUN: 17 mg/dL (ref 7–18)
EGFR (African American): >60
EGFR (Non-African Amer.): >60
Glucose: 92 mg/dL (ref 65–99)
Osmolality: 279 (ref 275–301)
Potassium: 3.9 mmol/L (ref 3.5–5.1)
Sodium: 139 mmol/L (ref 136–145)

## 2012-03-23 HISTORY — PX: PARTIAL HIP ARTHROPLASTY: SHX733

## 2012-03-27 LAB — CULTURE, BLOOD (SINGLE)

## 2012-07-25 ENCOUNTER — Ambulatory Visit: Payer: Self-pay | Admitting: Orthopedic Surgery

## 2012-07-25 LAB — CBC
HGB: 14 g/dL (ref 13.0–18.0)
MCH: 30.6 pg (ref 26.0–34.0)
MCHC: 35.9 g/dL (ref 32.0–36.0)
MCV: 85 fL (ref 80–100)
Platelet: 144 10*3/uL — ABNORMAL LOW (ref 150–440)
RDW: 13.6 % (ref 11.5–14.5)

## 2012-07-25 LAB — BASIC METABOLIC PANEL
Anion Gap: 7 (ref 7–16)
BUN: 14 mg/dL (ref 7–18)
Calcium, Total: 9.4 mg/dL (ref 8.5–10.1)
Chloride: 102 mmol/L (ref 98–107)
Co2: 25 mmol/L (ref 21–32)
EGFR (African American): >60
EGFR (Non-African Amer.): >60
Glucose: 104 mg/dL — ABNORMAL HIGH (ref 65–99)
Osmolality: 269 (ref 275–301)
Potassium: 3.8 mmol/L (ref 3.5–5.1)
Sodium: 134 mmol/L — ABNORMAL LOW (ref 136–145)

## 2012-07-25 LAB — PROTIME-INR: Prothrombin Time: 13.4 secs (ref 11.5–14.7)

## 2012-07-25 LAB — MRSA PCR SCREENING

## 2012-08-04 ENCOUNTER — Inpatient Hospital Stay: Payer: Self-pay | Admitting: Orthopedic Surgery

## 2012-08-05 LAB — HEMOGLOBIN
HGB: 10.5 g/dL — ABNORMAL LOW (ref 13.0–18.0)
HGB: 10.8 g/dL — ABNORMAL LOW (ref 13.0–18.0)

## 2012-08-05 LAB — URINALYSIS, COMPLETE
Bilirubin,UR: NEGATIVE
Leukocyte Esterase: NEGATIVE
Protein: NEGATIVE
Squamous Epithelial: NONE SEEN

## 2012-08-05 LAB — BASIC METABOLIC PANEL
Anion Gap: 3 — ABNORMAL LOW (ref 7–16)
Chloride: 105 mmol/L (ref 98–107)
Co2: 27 mmol/L (ref 21–32)
Creatinine: 1.26 mg/dL (ref 0.60–1.30)
EGFR (Non-African Amer.): 54 — ABNORMAL LOW
Osmolality: 274 (ref 275–301)
Sodium: 135 mmol/L — ABNORMAL LOW (ref 136–145)

## 2012-08-05 LAB — TROPONIN I
Troponin-I: <0.02 ng/mL
Troponin-I: <0.02 ng/mL

## 2012-08-06 LAB — CBC WITH DIFFERENTIAL/PLATELET
Basophil #: 0 10*3/uL (ref 0.0–0.1)
Basophil %: 0.4 %
Eosinophil #: 0.1 10*3/uL (ref 0.0–0.7)
Eosinophil %: 2 %
HCT: 30.1 % — ABNORMAL LOW (ref 40.0–52.0)
HGB: 10.8 g/dL — ABNORMAL LOW (ref 13.0–18.0)
Lymphocyte #: 0.7 10*3/uL — ABNORMAL LOW (ref 1.0–3.6)
Lymphocyte %: 11.9 %
MCH: 31.1 pg (ref 26.0–34.0)
MCHC: 35.7 g/dL (ref 32.0–36.0)
MCV: 87 fL (ref 80–100)
Monocyte #: 0.5 x10 3/mm (ref 0.2–1.0)
Monocyte %: 9 %
Neutrophil #: 4.5 10*3/uL (ref 1.4–6.5)
Neutrophil %: 76.7 %
Platelet: 95 10*3/uL — ABNORMAL LOW (ref 150–440)
RBC: 3.46 10*6/uL — ABNORMAL LOW (ref 4.40–5.90)
RDW: 13.6 % (ref 11.5–14.5)
WBC: 5.9 10*3/uL (ref 3.8–10.6)

## 2012-08-06 LAB — BASIC METABOLIC PANEL
Anion Gap: 8 (ref 7–16)
Calcium, Total: 7.9 mg/dL — ABNORMAL LOW (ref 8.5–10.1)
Co2: 24 mmol/L (ref 21–32)
Creatinine: 1 mg/dL (ref 0.60–1.30)
EGFR (African American): >60
EGFR (Non-African Amer.): >60
Glucose: 143 mg/dL — ABNORMAL HIGH (ref 65–99)
Potassium: 3.9 mmol/L (ref 3.5–5.1)
Sodium: 138 mmol/L (ref 136–145)

## 2012-08-06 LAB — HEPATIC FUNCTION PANEL A (ARMC)
Albumin: 2.5 g/dL — ABNORMAL LOW (ref 3.4–5.0)
Alkaline Phosphatase: 41 U/L — ABNORMAL LOW (ref 50–136)
Bilirubin, Direct: <0.1 mg/dL (ref 0.00–0.20)
Bilirubin,Total: 0.4 mg/dL (ref 0.2–1.0)
SGOT(AST): 21 U/L (ref 15–37)
SGPT (ALT): 11 U/L — ABNORMAL LOW (ref 12–78)
Total Protein: 5.3 g/dL — ABNORMAL LOW (ref 6.4–8.2)

## 2012-08-07 LAB — CBC WITH DIFFERENTIAL/PLATELET
Basophil #: 0 10*3/uL (ref 0.0–0.1)
Basophil %: 0.4 %
HCT: 27.6 % — ABNORMAL LOW (ref 40.0–52.0)
HGB: 9.9 g/dL — ABNORMAL LOW (ref 13.0–18.0)
Lymphocyte %: 28.9 %
MCH: 30.9 pg (ref 26.0–34.0)
Monocyte #: 0.7 x10 3/mm (ref 0.2–1.0)
Neutrophil #: 3.9 10*3/uL (ref 1.4–6.5)
Neutrophil %: 56.1 %
Platelet: 105 10*3/uL — ABNORMAL LOW (ref 150–440)
RBC: 3.21 10*6/uL — ABNORMAL LOW (ref 4.40–5.90)
WBC: 7 10*3/uL (ref 3.8–10.6)

## 2012-08-11 ENCOUNTER — Ambulatory Visit: Payer: Self-pay | Admitting: Orthopedic Surgery

## 2012-11-02 ENCOUNTER — Emergency Department: Payer: Self-pay | Admitting: Emergency Medicine

## 2012-11-02 LAB — COMPREHENSIVE METABOLIC PANEL
Albumin: 3.9 g/dL (ref 3.4–5.0)
Alkaline Phosphatase: 65 U/L (ref 50–136)
Anion Gap: 7 (ref 7–16)
BUN: 11 mg/dL (ref 7–18)
Bilirubin,Total: 0.3 mg/dL (ref 0.2–1.0)
Calcium, Total: 9.4 mg/dL (ref 8.5–10.1)
Creatinine: 1 mg/dL (ref 0.60–1.30)
EGFR (African American): >60
SGOT(AST): 23 U/L (ref 15–37)
SGPT (ALT): 15 U/L (ref 12–78)

## 2012-11-02 LAB — URINALYSIS, COMPLETE
Bilirubin,UR: NEGATIVE
Glucose,UR: NEGATIVE mg/dL (ref 0–75)
Ph: 6 (ref 4.5–8.0)
Protein: NEGATIVE
Specific Gravity: 1.02 (ref 1.003–1.030)
Squamous Epithelial: NONE SEEN
WBC UR: 10 /HPF (ref 0–5)

## 2012-11-02 LAB — CBC
HCT: 37.4 % — ABNORMAL LOW (ref 40.0–52.0)
HGB: 12.9 g/dL — ABNORMAL LOW (ref 13.0–18.0)
MCHC: 34.7 g/dL (ref 32.0–36.0)

## 2012-11-02 LAB — TROPONIN I: Troponin-I: <0.02 ng/mL

## 2013-02-28 ENCOUNTER — Encounter: Payer: Self-pay | Admitting: Neurology

## 2013-03-22 ENCOUNTER — Emergency Department: Payer: Self-pay | Admitting: Internal Medicine

## 2013-03-22 LAB — URINALYSIS, COMPLETE
Bacteria: NONE SEEN
Bilirubin,UR: NEGATIVE
Blood: NEGATIVE
Glucose,UR: NEGATIVE mg/dL (ref 0–75)
Hyaline Cast: 1
Nitrite: NEGATIVE
Ph: 5 (ref 4.5–8.0)
Protein: NEGATIVE
RBC,UR: 1 /HPF (ref 0–5)
Specific Gravity: 1.018 (ref 1.003–1.030)
Squamous Epithelial: NONE SEEN
WBC UR: 1 /HPF (ref 0–5)

## 2013-03-22 LAB — COMPREHENSIVE METABOLIC PANEL
Albumin: 3.2 g/dL — ABNORMAL LOW (ref 3.4–5.0)
Alkaline Phosphatase: 50 U/L
Anion Gap: 3 — ABNORMAL LOW (ref 7–16)
Calcium, Total: 8.4 mg/dL — ABNORMAL LOW (ref 8.5–10.1)
Chloride: 104 mmol/L (ref 98–107)
Co2: 30 mmol/L (ref 21–32)
Creatinine: 1.05 mg/dL (ref 0.60–1.30)
EGFR (African American): >60
EGFR (Non-African Amer.): >60
Glucose: 110 mg/dL — ABNORMAL HIGH (ref 65–99)
Osmolality: 275 (ref 275–301)
Potassium: 4.1 mmol/L (ref 3.5–5.1)
SGOT(AST): 33 U/L (ref 15–37)
SGPT (ALT): 9 U/L — ABNORMAL LOW (ref 12–78)
Sodium: 137 mmol/L (ref 136–145)
Total Protein: 6.5 g/dL (ref 6.4–8.2)

## 2013-03-22 LAB — SEDIMENTATION RATE: Erythrocyte Sed Rate: 8 mm/hr (ref 0–20)

## 2013-03-22 LAB — CBC
HGB: 13.4 g/dL (ref 13.0–18.0)
MCHC: 34.6 g/dL (ref 32.0–36.0)
MCV: 88 fL (ref 80–100)
Platelet: 97 10*3/uL — ABNORMAL LOW (ref 150–440)
RBC: 4.42 10*6/uL (ref 4.40–5.90)
RDW: 15 % — ABNORMAL HIGH (ref 11.5–14.5)
WBC: 4.7 10*3/uL (ref 3.8–10.6)

## 2013-03-22 LAB — TROPONIN I: Troponin-I: <0.02 ng/mL

## 2013-03-22 LAB — CK TOTAL AND CKMB (NOT AT ARMC): CK, Total: 74 U/L (ref 35–232)

## 2013-03-23 ENCOUNTER — Encounter: Payer: Self-pay | Admitting: Neurology

## 2013-04-15 ENCOUNTER — Ambulatory Visit: Payer: Self-pay | Admitting: Urology

## 2013-04-15 ENCOUNTER — Emergency Department: Payer: Self-pay | Admitting: Emergency Medicine

## 2013-04-15 LAB — COMPREHENSIVE METABOLIC PANEL
ALK PHOS: 56 U/L
AST: 21 U/L (ref 15–37)
Albumin: 3.2 g/dL — ABNORMAL LOW (ref 3.4–5.0)
Anion Gap: 4 — ABNORMAL LOW (ref 7–16)
BUN: 13 mg/dL (ref 7–18)
Bilirubin,Total: 0.4 mg/dL (ref 0.2–1.0)
Calcium, Total: 8.7 mg/dL (ref 8.5–10.1)
Chloride: 103 mmol/L (ref 98–107)
Co2: 25 mmol/L (ref 21–32)
Creatinine: 1.18 mg/dL (ref 0.60–1.30)
EGFR (African American): >60
GFR CALC NON AF AMER: 58 — AB
Glucose: 148 mg/dL — ABNORMAL HIGH (ref 65–99)
Osmolality: 267 (ref 275–301)
POTASSIUM: 3.7 mmol/L (ref 3.5–5.1)
SGPT (ALT): <6 U/L — ABNORMAL LOW (ref 12–78)
Sodium: 132 mmol/L — ABNORMAL LOW (ref 136–145)
Total Protein: 6.7 g/dL (ref 6.4–8.2)

## 2013-04-15 LAB — CBC WITH DIFFERENTIAL/PLATELET
BASOS ABS: 0 10*3/uL (ref 0.0–0.1)
Basophil %: 0.2 %
Eosinophil #: 0.3 10*3/uL (ref 0.0–0.7)
Eosinophil %: 3.5 %
HCT: 39.5 % — ABNORMAL LOW (ref 40.0–52.0)
HGB: 13.5 g/dL (ref 13.0–18.0)
LYMPHS ABS: 1.6 10*3/uL (ref 1.0–3.6)
Lymphocyte %: 21.6 %
MCH: 30.4 pg (ref 26.0–34.0)
MCHC: 34.2 g/dL (ref 32.0–36.0)
MCV: 89 fL (ref 80–100)
Monocyte #: 0.5 x10 3/mm (ref 0.2–1.0)
Monocyte %: 6.4 %
NEUTROS ABS: 5 10*3/uL (ref 1.4–6.5)
Neutrophil %: 68.3 %
Platelet: 117 10*3/uL — ABNORMAL LOW (ref 150–440)
RBC: 4.45 10*6/uL (ref 4.40–5.90)
RDW: 14.6 % — ABNORMAL HIGH (ref 11.5–14.5)
WBC: 7.3 10*3/uL (ref 3.8–10.6)

## 2013-04-15 LAB — URINALYSIS, COMPLETE
RBC,UR: 17515 /HPF (ref 0–5)
SPECIFIC GRAVITY: 1.019 (ref 1.003–1.030)
Squamous Epithelial: NONE SEEN
WBC UR: 1072 /HPF (ref 0–5)

## 2013-04-17 LAB — URINE CULTURE

## 2013-06-16 DIAGNOSIS — I499 Cardiac arrhythmia, unspecified: Secondary | ICD-10-CM | POA: Insufficient documentation

## 2013-06-16 DIAGNOSIS — E039 Hypothyroidism, unspecified: Secondary | ICD-10-CM | POA: Insufficient documentation

## 2013-06-16 DIAGNOSIS — I10 Essential (primary) hypertension: Secondary | ICD-10-CM | POA: Insufficient documentation

## 2013-06-16 DIAGNOSIS — E7800 Pure hypercholesterolemia, unspecified: Secondary | ICD-10-CM | POA: Insufficient documentation

## 2013-06-16 DIAGNOSIS — E785 Hyperlipidemia, unspecified: Secondary | ICD-10-CM | POA: Insufficient documentation

## 2013-06-16 DIAGNOSIS — M6530 Trigger finger, unspecified finger: Secondary | ICD-10-CM | POA: Insufficient documentation

## 2013-06-27 ENCOUNTER — Encounter: Payer: Self-pay | Admitting: General Surgery

## 2013-07-05 ENCOUNTER — Encounter: Payer: Self-pay | Admitting: General Surgery

## 2013-07-05 ENCOUNTER — Ambulatory Visit (INDEPENDENT_AMBULATORY_CARE_PROVIDER_SITE_OTHER): Payer: Medicare Other | Admitting: General Surgery

## 2013-07-05 VITALS — BP 130/70 | HR 82 | Resp 14 | Ht 68.0 in | Wt 205.0 lb

## 2013-07-05 DIAGNOSIS — R1909 Other intra-abdominal and pelvic swelling, mass and lump: Secondary | ICD-10-CM

## 2013-07-05 DIAGNOSIS — R1900 Intra-abdominal and pelvic swelling, mass and lump, unspecified site: Secondary | ICD-10-CM

## 2013-07-05 NOTE — Patient Instructions (Addendum)
Patient to have a cat scan of abdomen and pelivs . Follow up appointment to be announced.   Patient is scheduled for a Cat scan of the abdomen and pelvis with out contrast at [facility] for 07/07/13 at 2:30 pm. Patient is aware of instructions and date and time.

## 2013-07-05 NOTE — Progress Notes (Signed)
Patient ID: Lawrence Valdez, male   DOB: 1932/03/15, 80 y.o.   MRN: [id number]  Chief Complaint  Patient presents with  . Other    hernia    HPI Lawrence Valdez is a 80 y.o. male here today for a evaluation of a left inguinal  hernia. Patient was seen in the ER in December 2014 for prostate problems.He states the left inguinal area has been hurting him for about an year. He states the area hurts more when he has a bowel movement.patient states the area is getting bigger. HPI  Past Medical History  Diagnosis Date  . Hypertension   . Pacemaker   . Hypothyroidism   . Chronic kidney disease     enlarged prostrate  . Depression   . Dysrhythmia   . Arthritis     Past Surgical History  Procedure Laterality Date  . Insert / replace / remove pacemaker      dr Ubaldo Glassing  medtronic     09  . Cardiac catheterization      09  . Joint replacement      lt knee  . Appendectomy    . Thumbs      trigger thumbs  . Cataract extraction    . Lumbar laminectomy/decompression microdiscectomy  08/06/2011    Procedure: LUMBAR LAMINECTOMY/DECOMPRESSION MICRODISCECTOMY 2 LEVELS;  Surgeon: Eustace Moore, MD;  Location: [place] [facility];  Service: Neurosurgery;  Laterality: N/A;  Lumbar two three, lumbar three four decompressive laminectomy  . Hernia repair  20 years ago    umbiluical   . Partial hip arthroplasty Right 2014    History reviewed. No pertinent family history.  Social History History  Substance Use Topics  . Smoking status: Former Smoker    Quit date: 08/04/1961  . Smokeless tobacco: Never Used  . Alcohol Use: Yes     Comment: socially    Allergies  Allergen Reactions  . Adhesive [Tape] Itching and Other (See Comments)    blisters    Current Outpatient Prescriptions  Medication Sig Dispense Refill  . aspirin EC 325 MG tablet Take 325 mg by mouth daily.      . cholecalciferol (VITAMIN D) 1000 UNITS tablet Take 4,000 Units by mouth daily.      . citalopram (CELEXA) 10 MG tablet  Take 10 mg by mouth daily.      . hydrochlorothiazide (HYDRODIURIL) 12.5 MG tablet Take 12.5 mg by mouth daily.      Marland Kitchen levothyroxine (SYNTHROID, LEVOTHROID) 75 MCG tablet Take 75 mcg by mouth daily.      Marland Kitchen lisinopril (PRINIVIL,ZESTRIL) 10 MG tablet Take 10 mg by mouth daily.      Marland Kitchen loratadine (CLARITIN) 10 MG tablet Take 10 mg by mouth daily.      . Multiple Vitamin (MULITIVITAMIN WITH MINERALS) TABS Take 1 tablet by mouth daily.      . polyethylene glycol (MIRALAX / GLYCOLAX) packet Take 17 g by mouth at bedtime.      . Tamsulosin HCl (FLOMAX) 0.4 MG CAPS Take 0.4 mg by mouth daily.      . meclizine (ANTIVERT) 25 MG tablet Take 25 mg by mouth 3 (three) times daily as needed. For dizziness       No current facility-administered medications for this visit.    Review of Systems Review of Systems  Constitutional: Negative.   Respiratory: Negative.   Cardiovascular: Negative.   Gastrointestinal: Positive for constipation. Negative for nausea, vomiting, abdominal pain, diarrhea, blood in stool, abdominal distention,  anal bleeding and rectal pain.    Blood pressure 130/70, pulse 82, resp. rate 14, height 5\' 8"  (1.727 m), weight 205 lb (92.987 kg).  Physical Exam Physical Exam  Constitutional: He is oriented to person, place, and time. He appears well-developed and well-nourished.  Eyes: Conjunctivae are normal.  Neck: Neck supple.  Cardiovascular: Normal rate, regular rhythm and normal heart sounds.   Pulmonary/Chest: Effort normal and breath sounds normal.  Abdominal: Soft. Bowel sounds are normal. There is no tenderness. A hernia is present. Hernia confirmed positive in the left inguinal area.  There is a firm to hard mass in left groin, appears to be located below pubic area, no impulse with straining or coughing. It is mobile and mildly tender. Unsure whether this is a hernia or mass associated with cord structures.   Neurological: He is alert and oriented to person, place, and time.   Skin: Skin is warm and dry.    Data Reviewed    Assessment    Left groin mass-possible hernia. A CT may help     Plan    Patient to have a Cat scan of abdomen and pelvis.      Patient is scheduled for a Cat scan of the abdomen and pelvis with out contrast at [facility] for 07/07/13 at 2:30 pm. Patient is aware of instructions and date and time.  Allen Egerton G Renji Berwick 07/05/2013, 10:26 AM

## 2013-07-10 ENCOUNTER — Encounter: Payer: Self-pay | Admitting: General Surgery

## 2013-07-10 ENCOUNTER — Telehealth: Payer: Self-pay | Admitting: General Surgery

## 2013-07-10 NOTE — Telephone Encounter (Signed)
CT scan of abdomen reviewed and he does have a hernia containing a loop of sigmoid colon-not obstructing. Open repair was recommended and Ms Pigot understood. Will see pt for a preop discussion. He reportedly has a prostate surgery of some sort planned-will discuss with Dr. Elnoria Howard.

## 2013-07-11 ENCOUNTER — Telehealth: Payer: Self-pay

## 2013-07-11 ENCOUNTER — Other Ambulatory Visit: Payer: Self-pay | Admitting: General Surgery

## 2013-07-11 DIAGNOSIS — K4090 Unilateral inguinal hernia, without obstruction or gangrene, not specified as recurrent: Secondary | ICD-10-CM

## 2013-07-11 NOTE — Telephone Encounter (Signed)
Patient is scheduled for surgical repair of his hernia at [facility] on 07/25/13. He will have pre admit testing done at the hospital on 07/17/13 at 10:45 am. He will see Dr Jamal Collin in his office for Pre Op check up on 07/24/13 at 1:45 pm.

## 2013-07-17 ENCOUNTER — Encounter: Payer: Self-pay | Admitting: General Surgery

## 2013-07-17 ENCOUNTER — Ambulatory Visit: Payer: Self-pay | Admitting: General Surgery

## 2013-07-17 LAB — CBC WITH DIFFERENTIAL/PLATELET
Basophil #: 0 10*3/uL (ref 0.0–0.1)
Basophil %: 0.4 %
EOS ABS: 0.2 10*3/uL (ref 0.0–0.7)
Eosinophil %: 3.8 %
HCT: 40 % (ref 40.0–52.0)
HGB: 13.7 g/dL (ref 13.0–18.0)
LYMPHS ABS: 1.8 10*3/uL (ref 1.0–3.6)
LYMPHS PCT: 27.9 %
MCH: 31.3 pg (ref 26.0–34.0)
MCHC: 34.3 g/dL (ref 32.0–36.0)
MCV: 91 fL (ref 80–100)
MONO ABS: 0.5 x10 3/mm (ref 0.2–1.0)
MONOS PCT: 7.4 %
NEUTROS ABS: 3.8 10*3/uL (ref 1.4–6.5)
Neutrophil %: 60.5 %
PLATELETS: 137 10*3/uL — AB (ref 150–440)
RBC: 4.37 10*6/uL — AB (ref 4.40–5.90)
RDW: 14.7 % — ABNORMAL HIGH (ref 11.5–14.5)
WBC: 6.3 10*3/uL (ref 3.8–10.6)

## 2013-07-17 LAB — BASIC METABOLIC PANEL
Anion Gap: 7 (ref 7–16)
BUN: 15 mg/dL (ref 7–18)
CO2: 27 mmol/L (ref 21–32)
Calcium, Total: 9.4 mg/dL (ref 8.5–10.1)
Chloride: 103 mmol/L (ref 98–107)
Creatinine: 1.11 mg/dL (ref 0.60–1.30)
EGFR (African American): >60
EGFR (Non-African Amer.): >60
Glucose: 118 mg/dL — ABNORMAL HIGH (ref 65–99)
OSMOLALITY: 276 (ref 275–301)
Potassium: 4.2 mmol/L (ref 3.5–5.1)
Sodium: 137 mmol/L (ref 136–145)

## 2013-07-24 ENCOUNTER — Ambulatory Visit (INDEPENDENT_AMBULATORY_CARE_PROVIDER_SITE_OTHER): Payer: Medicare Other | Admitting: General Surgery

## 2013-07-24 ENCOUNTER — Encounter: Payer: Self-pay | Admitting: General Surgery

## 2013-07-24 VITALS — BP 130/78 | HR 77 | Resp 14 | Ht 68.0 in | Wt 205.0 lb

## 2013-07-24 DIAGNOSIS — K4030 Unilateral inguinal hernia, with obstruction, without gangrene, not specified as recurrent: Secondary | ICD-10-CM

## 2013-07-24 NOTE — Progress Notes (Signed)
Patient ID: Lawrence Valdez, male   DOB: [birth year], 80 y.o.   MRN: [id number]  Chief Complaint  Patient presents with  . Pre-op Exam    left inguinal hernia    HPI Lawrence Valdez is a 80 y.o. male here today for his pre op left inguinal hernia scheduled for 07/25/13. Patient reports the area is a little more painful but no other changes since last seen.  HPI  Past Medical History  Diagnosis Date  . Hypertension   . Pacemaker   . Hypothyroidism   . Chronic kidney disease     enlarged prostrate  . Depression   . Dysrhythmia   . Arthritis     Past Surgical History  Procedure Laterality Date  . Insert / replace / remove pacemaker      dr Ubaldo Glassing  medtronic     09  . Cardiac catheterization      09  . Joint replacement      lt knee  . Appendectomy    . Thumbs      trigger thumbs  . Cataract extraction    . Lumbar laminectomy/decompression microdiscectomy  08/06/2011    Procedure: LUMBAR LAMINECTOMY/DECOMPRESSION MICRODISCECTOMY 2 LEVELS;  Surgeon: Eustace Moore, MD;  Location: [place] [facility];  Service: Neurosurgery;  Laterality: N/A;  Lumbar two three, lumbar three four decompressive laminectomy  . Hernia repair  20 years ago    umbiluical   . Partial hip arthroplasty Right 2014    History reviewed. No pertinent family history.  Social History History  Substance Use Topics  . Smoking status: Former Smoker    Quit date: 08/04/1961  . Smokeless tobacco: Never Used  . Alcohol Use: Yes     Comment: socially    Allergies  Allergen Reactions  . Adhesive [Tape] Itching and Other (See Comments)    blisters    Current Outpatient Prescriptions  Medication Sig Dispense Refill  . aspirin EC 325 MG tablet Take 325 mg by mouth daily.      . cholecalciferol (VITAMIN D) 1000 UNITS tablet Take 4,000 Units by mouth daily.      . citalopram (CELEXA) 10 MG tablet Take 10 mg by mouth daily.      . hydrochlorothiazide (HYDRODIURIL) 12.5 MG tablet Take 12.5 mg by mouth daily.      Marland Kitchen  levothyroxine (SYNTHROID, LEVOTHROID) 75 MCG tablet Take 75 mcg by mouth daily.      Marland Kitchen lisinopril (PRINIVIL,ZESTRIL) 10 MG tablet Take 10 mg by mouth daily.      Marland Kitchen loratadine (CLARITIN) 10 MG tablet Take 10 mg by mouth daily.      . meclizine (ANTIVERT) 25 MG tablet Take 25 mg by mouth 3 (three) times daily as needed. For dizziness      . Multiple Vitamin (MULITIVITAMIN WITH MINERALS) TABS Take 1 tablet by mouth daily.      . polyethylene glycol (MIRALAX / GLYCOLAX) packet Take 17 g by mouth at bedtime.      . Tamsulosin HCl (FLOMAX) 0.4 MG CAPS Take 0.4 mg by mouth daily.       No current facility-administered medications for this visit.    Review of Systems Review of Systems  Constitutional: Negative.   Respiratory: Negative.   Cardiovascular: Negative.     Blood pressure 130/78, pulse 77, resp. rate 14, height 5\' 8"  (1.727 m), weight 205 lb (92.987 kg).  Physical Exam Physical Exam  Constitutional: He is oriented to person, place, and time. He appears well-developed  and well-nourished.  Neck: Neck supple. No thyromegaly present.  Cardiovascular: Normal rate, regular rhythm and normal heart sounds.   Pulmonary/Chest: Effort normal and breath sounds normal.  Abdominal: Soft. Bowel sounds are normal. There is no tenderness. A hernia is present. Hernia confirmed positive in the left inguinal area (Not reducable  Large, mildly tender).  Neurological: He is alert and oriented to person, place, and time.    Data Reviewed CT scan shows loop of bowel in left ing hernia, no obstruction.  Assessment    Irreducible left ing hernia. Exam otherwise stable    Plan    Open repair  With mesh. Procedure, including risks and benefits explained. Pt agreeable. He is scheduled for this to be done tomorrow.       Seeplaputhur G Sankar 07/24/2013, 2:27 PM

## 2013-07-24 NOTE — Patient Instructions (Addendum)
Hernia Repair with Laparoscope A hernia occurs when an internal organ pushes out through a weak spot in the belly (abdominal) wall muscles. Hernias most commonly occur in the groin and around the navel. Hernias can also occur through a cut by the surgeon (incision) after an abdominal operation. A hernia may be caused by:  Lifting heavy objects.  Prolonged coughing.  Straining to move your bowels. Hernias can often be pushed back into place (reduced). Most hernias tend to get worse over time. Problems occur when abdominal contents get stuck in the opening and the blood supply is blocked or impaired (incarcerated hernia). Because of these risks, you require surgery to repair the hernia. Your hernia will be repaired using a laparoscope. Laparoscopic surgery is a type of minimally invasive surgery. It does not involve making a typical surgical cut (incision) in the skin. A laparoscope is a telescope-like rod and lens system. It is usually connected to a video camera and a light source so your caregiver can clearly see the operative area. The instruments are inserted through  to  inch (5 mm or 10 mm) openings in the skin at specific locations. A working and viewing space is created by blowing a small amount of carbon dioxide gas into the abdominal cavity. The abdomen is essentially blown up like a balloon (insufflated). This elevates the abdominal wall above the internal organs like a dome. The carbon dioxide gas is common to the human body and can be absorbed by tissue and removed by the respiratory system. Once the repair is completed, the small incisions will be closed with either stitches (sutures) or staples (just like a paper stapler only this staple holds the skin together). LET YOUR CAREGIVERS KNOW ABOUT:  Allergies.  Medications taken including herbs, eye drops, over the counter medications, and creams.  Use of steroids (by mouth or creams).  Previous problems with anesthetics or  Novocaine.  Possibility of pregnancy, if this applies.  History of blood clots (thrombophlebitis).  History of bleeding or blood problems.  Previous surgery.  Other health problems. BEFORE THE PROCEDURE  Laparoscopy can be done either in a hospital or out-patient clinic. You may be given a mild sedative to help you relax before the procedure. Once in the operating room, you will be given a general anesthesia to make you sleep (unless you and your caregiver choose a different anesthetic).  AFTER THE PROCEDURE  After the procedure you will be watched in a recovery area. Depending on what type of hernia was repaired, you might be admitted to the hospital or you might go home the same day. With this procedure you may have less pain and scarring. This usually results in a quicker recovery and less risk of infection. HOME CARE INSTRUCTIONS   Bed rest is not required. You may continue your normal activities but avoid heavy lifting (more than 10 pounds) or straining.  Cough gently. If you are a smoker it is best to stop, as even the best hernia repair can break down with the continual strain of coughing.  Avoid driving until given the OK by your surgeon.  There are no dietary restrictions unless given otherwise.  TAKE ALL MEDICATIONS AS DIRECTED.  Only take over-the-counter or prescription medicines for pain, discomfort, or fever as directed by your caregiver. SEEK MEDICAL CARE IF:   There is increasing abdominal pain or pain in your incisions.  There is more bleeding from incisions, other than minimal spotting.  You feel light headed or faint.  You   develop an unexplained fever, chills, and/or an oral temperature above 102 F (38.9 C).  You have redness, swelling, or increasing pain in the wound.  Pus coming from wound.  A foul smell coming from the wound or dressings. SEEK IMMEDIATE MEDICAL CARE IF:   You develop a rash.  You have difficulty breathing.  You have any  allergic problems. MAKE SURE YOU:   Understand these instructions.  Will watch your condition.  Will get help right away if you are not doing well or get worse. Document Released: 03/09/2005 Document Revised: 06/01/2011 Document Reviewed: 02/06/2009 [employer] Patient Information 2014 [employer].  

## 2013-07-25 ENCOUNTER — Ambulatory Visit: Payer: Self-pay | Admitting: General Surgery

## 2013-07-25 DIAGNOSIS — K4030 Unilateral inguinal hernia, with obstruction, without gangrene, not specified as recurrent: Secondary | ICD-10-CM

## 2013-07-25 HISTORY — PX: HERNIA REPAIR: SHX51

## 2013-07-26 ENCOUNTER — Encounter: Payer: Self-pay | Admitting: General Surgery

## 2013-08-03 ENCOUNTER — Ambulatory Visit (INDEPENDENT_AMBULATORY_CARE_PROVIDER_SITE_OTHER): Payer: Self-pay | Admitting: General Surgery

## 2013-08-03 ENCOUNTER — Encounter: Payer: Self-pay | Admitting: General Surgery

## 2013-08-03 VITALS — BP 114/80 | HR 76 | Resp 12 | Ht 68.0 in | Wt 205.0 lb

## 2013-08-03 DIAGNOSIS — K4090 Unilateral inguinal hernia, without obstruction or gangrene, not specified as recurrent: Secondary | ICD-10-CM

## 2013-08-03 NOTE — Progress Notes (Signed)
Here today for postop visit, he had left inguinal hernia repair on 07-25-13. He had a sliding hernia, repair done with Progrip mesh. States he is doing well. Pain minbimal. Denies changes in bowel movements.   Left groin incision is clean and healing well. No hematoma or seroma noted. Repair is intact. Starting next week he can increase activity as tolerated.

## 2013-08-16 DIAGNOSIS — M549 Dorsalgia, unspecified: Secondary | ICD-10-CM | POA: Insufficient documentation

## 2013-08-29 DIAGNOSIS — R413 Other amnesia: Secondary | ICD-10-CM | POA: Insufficient documentation

## 2013-08-29 DIAGNOSIS — G479 Sleep disorder, unspecified: Secondary | ICD-10-CM | POA: Insufficient documentation

## 2013-08-29 DIAGNOSIS — F32A Depression, unspecified: Secondary | ICD-10-CM | POA: Insufficient documentation

## 2013-08-29 DIAGNOSIS — F329 Major depressive disorder, single episode, unspecified: Secondary | ICD-10-CM | POA: Insufficient documentation

## 2013-08-29 DIAGNOSIS — R6889 Other general symptoms and signs: Secondary | ICD-10-CM | POA: Insufficient documentation

## 2013-08-29 DIAGNOSIS — F3341 Major depressive disorder, recurrent, in partial remission: Secondary | ICD-10-CM | POA: Insufficient documentation

## 2013-08-29 DIAGNOSIS — R42 Dizziness and giddiness: Secondary | ICD-10-CM | POA: Insufficient documentation

## 2013-08-29 DIAGNOSIS — R262 Difficulty in walking, not elsewhere classified: Secondary | ICD-10-CM | POA: Insufficient documentation

## 2013-08-29 DIAGNOSIS — R5383 Other fatigue: Secondary | ICD-10-CM | POA: Insufficient documentation

## 2013-08-29 DIAGNOSIS — R251 Tremor, unspecified: Secondary | ICD-10-CM | POA: Insufficient documentation

## 2013-08-29 DIAGNOSIS — W19XXXA Unspecified fall, initial encounter: Secondary | ICD-10-CM | POA: Insufficient documentation

## 2013-09-04 ENCOUNTER — Encounter: Payer: Self-pay | Admitting: General Surgery

## 2013-09-04 ENCOUNTER — Ambulatory Visit (INDEPENDENT_AMBULATORY_CARE_PROVIDER_SITE_OTHER): Payer: Self-pay | Admitting: General Surgery

## 2013-09-04 VITALS — BP 130/66 | HR 74 | Resp 14 | Ht 68.0 in | Wt 202.0 lb

## 2013-09-04 DIAGNOSIS — K4090 Unilateral inguinal hernia, without obstruction or gangrene, not specified as recurrent: Secondary | ICD-10-CM

## 2013-09-04 NOTE — Patient Instructions (Signed)
Call for any new problems with hernia repair.

## 2013-09-04 NOTE — Progress Notes (Signed)
Patient ID: Lawrence Valdez, male   DOB: 1931-11-07, 80 y.o.   MRN: [id number]  The patient presents for a 1 month post op left inguinal hernia repair. The procedure was performed on 07/25/13. The patient denies any new problems at this time. Doing well.  Left groin incision is well healed. Repair is intact. No tenderness. Good result post repair left ing hernia. RTC prn

## 2013-09-12 ENCOUNTER — Encounter: Payer: Self-pay | Admitting: Neurology

## 2013-09-20 ENCOUNTER — Encounter: Payer: Self-pay | Admitting: Neurology

## 2013-10-21 ENCOUNTER — Encounter: Payer: Self-pay | Admitting: Neurology

## 2013-11-30 ENCOUNTER — Ambulatory Visit: Payer: Self-pay | Admitting: Urology

## 2013-11-30 LAB — CBC
HCT: 43.2 % (ref 40.0–52.0)
HGB: 14.3 g/dL (ref 13.0–18.0)
MCH: 30 pg (ref 26.0–34.0)
MCHC: 33 g/dL (ref 32.0–36.0)
MCV: 91 fL (ref 80–100)
Platelet: 161 10*3/uL (ref 150–440)
RBC: 4.76 10*6/uL (ref 4.40–5.90)
RDW: 13.4 % (ref 11.5–14.5)
WBC: 7.2 10*3/uL (ref 3.8–10.6)

## 2013-11-30 LAB — BASIC METABOLIC PANEL
Anion Gap: 6 — ABNORMAL LOW (ref 7–16)
BUN: 16 mg/dL (ref 7–18)
CHLORIDE: 107 mmol/L (ref 98–107)
CO2: 26 mmol/L (ref 21–32)
Calcium, Total: 9 mg/dL (ref 8.5–10.1)
Creatinine: 1.05 mg/dL (ref 0.60–1.30)
GLUCOSE: 102 mg/dL — AB (ref 65–99)
OSMOLALITY: 279 (ref 275–301)
Potassium: 3.9 mmol/L (ref 3.5–5.1)
Sodium: 139 mmol/L (ref 136–145)

## 2013-12-18 ENCOUNTER — Ambulatory Visit: Payer: Self-pay | Admitting: Urology

## 2013-12-28 DIAGNOSIS — G8929 Other chronic pain: Secondary | ICD-10-CM | POA: Insufficient documentation

## 2013-12-28 DIAGNOSIS — M549 Dorsalgia, unspecified: Secondary | ICD-10-CM

## 2014-02-01 ENCOUNTER — Ambulatory Visit: Payer: Self-pay | Admitting: Pain Medicine

## 2014-02-08 ENCOUNTER — Ambulatory Visit: Payer: Self-pay | Admitting: Pain Medicine

## 2014-02-12 ENCOUNTER — Ambulatory Visit: Payer: Self-pay | Admitting: Pain Medicine

## 2014-03-14 ENCOUNTER — Ambulatory Visit: Payer: Self-pay | Admitting: Urology

## 2014-03-14 LAB — BASIC METABOLIC PANEL
ANION GAP: 8 (ref 7–16)
BUN: 12 mg/dL (ref 7–18)
CALCIUM: 8.9 mg/dL (ref 8.5–10.1)
Chloride: 101 mmol/L (ref 98–107)
Co2: 25 mmol/L (ref 21–32)
Creatinine: 1.08 mg/dL (ref 0.60–1.30)
EGFR (African American): >60
EGFR (Non-African Amer.): >60
GLUCOSE: 112 mg/dL — AB (ref 65–99)
Osmolality: 269 (ref 275–301)
POTASSIUM: 3.7 mmol/L (ref 3.5–5.1)
SODIUM: 134 mmol/L — AB (ref 136–145)

## 2014-03-14 LAB — CBC WITH DIFFERENTIAL/PLATELET
Basophil #: 0 10*3/uL (ref 0.0–0.1)
Basophil %: 0.4 %
Eosinophil #: 0.2 10*3/uL (ref 0.0–0.7)
Eosinophil %: 2.9 %
HCT: 42.2 % (ref 40.0–52.0)
HGB: 14.4 g/dL (ref 13.0–18.0)
LYMPHS PCT: 28.4 %
Lymphocyte #: 1.9 10*3/uL (ref 1.0–3.6)
MCH: 31.2 pg (ref 26.0–34.0)
MCHC: 34.1 g/dL (ref 32.0–36.0)
MCV: 92 fL (ref 80–100)
MONOS PCT: 7.3 %
Monocyte #: 0.5 x10 3/mm (ref 0.2–1.0)
NEUTROS ABS: 4 10*3/uL (ref 1.4–6.5)
NEUTROS PCT: 61 %
Platelet: 145 10*3/uL — ABNORMAL LOW (ref 150–440)
RBC: 4.6 10*6/uL (ref 4.40–5.90)
RDW: 13.6 % (ref 11.5–14.5)
WBC: 6.6 10*3/uL (ref 3.8–10.6)

## 2014-03-20 ENCOUNTER — Ambulatory Visit: Payer: Self-pay | Admitting: Pain Medicine

## 2014-03-26 ENCOUNTER — Ambulatory Visit: Payer: Self-pay | Admitting: Urology

## 2014-05-02 DIAGNOSIS — M79604 Pain in right leg: Secondary | ICD-10-CM | POA: Insufficient documentation

## 2014-05-02 DIAGNOSIS — G20A1 Parkinson's disease without dyskinesia, without mention of fluctuations: Secondary | ICD-10-CM | POA: Insufficient documentation

## 2014-05-02 DIAGNOSIS — G20 Parkinson's disease: Secondary | ICD-10-CM | POA: Insufficient documentation

## 2014-05-16 ENCOUNTER — Ambulatory Visit: Payer: Self-pay | Admitting: Pain Medicine

## 2014-06-14 ENCOUNTER — Ambulatory Visit: Payer: Self-pay | Admitting: Pain Medicine

## 2014-06-19 ENCOUNTER — Emergency Department: Payer: Self-pay | Admitting: Emergency Medicine

## 2014-06-19 LAB — CBC WITH DIFFERENTIAL/PLATELET
Basophil #: 0 10*3/uL (ref 0.0–0.1)
Basophil %: 0.6 %
EOS ABS: 0.2 10*3/uL (ref 0.0–0.7)
Eosinophil %: 3 %
HCT: 41.2 % (ref 40.0–52.0)
HGB: 14 g/dL (ref 13.0–18.0)
Lymphocyte #: 2.5 10*3/uL (ref 1.0–3.6)
Lymphocyte %: 40.2 %
MCH: 30.9 pg (ref 26.0–34.0)
MCHC: 34 g/dL (ref 32.0–36.0)
MCV: 91 fL (ref 80–100)
Monocyte #: 0.4 x10 3/mm (ref 0.2–1.0)
Monocyte %: 7.2 %
NEUTROS ABS: 3 10*3/uL (ref 1.4–6.5)
Neutrophil %: 49 %
Platelet: 143 10*3/uL — ABNORMAL LOW (ref 150–440)
RBC: 4.53 10*6/uL (ref 4.40–5.90)
RDW: 13.6 % (ref 11.5–14.5)
WBC: 6.2 10*3/uL (ref 3.8–10.6)

## 2014-06-19 LAB — URINALYSIS, COMPLETE
BILIRUBIN, UR: NEGATIVE
BLOOD: NEGATIVE
Bacteria: NONE SEEN
Glucose,UR: NEGATIVE mg/dL (ref 0–75)
Ketone: NEGATIVE
NITRITE: NEGATIVE
PH: 5 (ref 4.5–8.0)
Protein: NEGATIVE
Specific Gravity: 1.012 (ref 1.003–1.030)
Squamous Epithelial: <1

## 2014-06-19 LAB — BASIC METABOLIC PANEL
ANION GAP: 8 (ref 7–16)
BUN: 20 mg/dL
CALCIUM: 9.2 mg/dL
Chloride: 102 mmol/L
Co2: 27 mmol/L
Creatinine: 1.08 mg/dL
EGFR (African American): >60
EGFR (Non-African Amer.): >60
Glucose: 117 mg/dL — ABNORMAL HIGH
POTASSIUM: 3.7 mmol/L
SODIUM: 137 mmol/L

## 2014-06-19 LAB — TROPONIN I

## 2014-06-25 ENCOUNTER — Ambulatory Visit
Admit: 2014-06-25 | Disposition: A | Discharge status: Home / Self Care | Payer: Self-pay | Attending: Pain Medicine | Admitting: Pain Medicine

## 2014-07-13 NOTE — Consult Note (Signed)
PATIENT NAME:  Lawrence Valdez, SULEIMAN MR#:  [id number] DATE OF BIRTH:  1932-02-28  DATE OF CONSULTATION:  08/05/2012  REFERRING PHYSICIAN:  Hessie Knows.  CONSULTING PHYSICIAN:  Clovis Pu. Lenore Manner, MD  REASON FOR CONSULTATION: Hypotension.   HISTORY OF PRESENT ILLNESS: Mr. Sikorski is a 81 year old, pleasant, Caucasian male with a history of osteoarthritis of the right hip, admitted for elective right hip surgery which was done yesterday. Postoperatively was doing well; however, noticed to have some local bleeding from the surgical site earlier. The amount was not specified, but according to the patient, it was a good amount. Thereafter, the patient was noticed to have low blood pressure reaching 70 and 80 systolic. Hemoglobin and hematocrit were ordered, and these came back to be 10.5 and 30, respectively. That did not explain the hypotension. Therefore, a medical consult was ordered. The patient, at the time being, denies having any complaints. He has no dizziness. No headache. No chest pain. No shortness of breath. No palpitations. No abdominal pain. No vomiting. No diarrhea. No fever. No chills. The patient's past history includes hypertension, and usually, he is on lisinopril 10 mg a day and hydrochlorothiazide.   REVIEW OF SYSTEMS:   CONSTITUTIONAL: Denies any fever. No chills. No fatigue.  EYES: No blurring of vision. No double vision.  ENT: No hearing impairment. No sore throat. No dysphagia.  CARDIOVASCULAR: No chest pain. No shortness of breath. No peripheral edema. No syncope.  RESPIRATORY: No cough. No sputum production. No hemoptysis. No chest pain.  GASTROINTESTINAL: No abdominal pain. No vomiting. No diarrhea.  GENITOURINARY: No dysuria. No frequency of urination.  MUSCULOSKELETAL: Apart from the hip surgical pain, there is no other joint pain or swelling. No muscular pain or swelling.  INTEGUMENTARY: No skin rash. No ulcers.  NEUROLOGY: No focal weakness. No seizure activity. No headache.   PSYCHIATRY: No anxiety. No depression.  ENDOCRINE: No polyuria or polydipsia. No heat or cold intolerance.   PAST MEDICAL HISTORY: Systemic hypertension, coronary artery disease, osteoarthritis, hypercholesterolemia, hypothyroidism, history of depression, cataract.   PAST SURGICAL HISTORY: Hernia repair, shoulder surgery, lumbar spinal fusion, bilateral cataract surgery, left total hip replacement, umbilical hernia repair, prostatic surgery, appendectomy and hammertoe surgery.   SOCIAL HABITS: Nonsmoker. No history of alcohol or drug abuse.   SOCIAL HISTORY: He is widowed, retired.   FAMILY HISTORY: He has a brother who has prostate cancer. His mother suffered from a heart attack.   ADMISSION MEDICATIONS: Hydrochlorothiazide 12.5 mg once a day. Lisinopril 10 mg once a day. Citalopram 20 mg a day. BuSpar or buspirone 5 mg twice a day. Aspirin 325 mg a day. Finasteride 5 mg a day. Levothyroxine 75 mcg once a day. Loratadine 10 mg a day. Multivitamin once a day. Oxycodone 5 to 10 mg q.4 hours p.r.n. MiraLAX 17 g a day p.r.n. for constipation. Pravastatin 20 mg a day. Flomax 0.4 mg once a day. Ambien 5 mg p.r.n. at night for insomnia. The patient is also on Xarelto 10 mg a day.   ALLERGIES: No known drug allergies.   PHYSICAL EXAMINATION:  VITAL SIGNS: His blood pressure sequence is as follows: 85/54, 88/60, 105/62, 85/53, 72/51, 68/44, 70/50 and now it is 90/60. Pulse is ranging between 52 to 64, respiratory rate 18, temperature 98 and O2 saturation 92%.  GENERAL APPEARANCE: Elderly male lying in bed in no acute distress.  HEAD AND NECK: No pallor. No icterus. No cyanosis. Ear examination revealed normal hearing, no discharge, no lesions. Examination of the nose showed  no ulcers, no discharge. Oropharyngeal examination revealed normal lips and tongue, no oral thrush, no ulcers. Eye examination revealed normal eyelids and conjunctivae. Both pupils are constricted and equal. Neck is supple. Trachea  at midline. No thyromegaly. No cervical lymphadenopathy. No masses.  HEART: Normal S1, S2. No S3, S4. No murmur. Distant heart sounds. No gallop. No carotid bruits.  RESPIRATORY: Normal breathing pattern without use of accessory muscles. No rales. No wheezing.  ABDOMEN: Soft without tenderness. No hepatosplenomegaly. No masses. No hernias.  SKIN: No ulcers. No subcutaneous nodules.  MUSCULOSKELETAL: No joint swelling. No clubbing.  NEUROLOGIC: Cranial nerves II through XII were intact. No focal motor deficit.  PSYCHIATRIC: The patient is alert and oriented x3. Mood and affect were normal.   LABORATORY FINDINGS: The only labs I see are a hemoglobin of 10.5 and hematocrit of 30.   ASSESSMENT:  1. Unexplained hypotension. The patient had some local bleeding from the surgical site earlier; however, the followup hemoglobin did not support this as a cause for his hypotension. Nevertheless, this needs to be followed up again to recheck his hemoglobin.   2. Right hip osteoarthritis. Underwent right hip joint replacement.  3. Systemic hypertension by history. Now, he is hypotensive.  4. History of coronary artery disease.  5. Hypothyroidism.  6. Hypercholesterolemia.   PLAN: Rechecking the blood pressure now it is improving. I will check his troponin x1. Check EKG. Repeat hemoglobin in 6 to 8 hours. Hold lisinopril and hydrochlorothiazide. I will also hold Flomax. These are temporary until his blood pressure improves. The medical team will continue to follow up on his condition.     ____________________________ Clovis Pu. Lenore Manner, MD amd:gb D: 08/05/2012 05:54:57 ET T: 08/05/2012 06:16:33 ET JOB#: [id number]  cc: Clovis Pu. Lenore Manner, MD, <Dictator> Mike Craze Irven Coe MD ELECTRONICALLY SIGNED 08/14/2012 23:30

## 2014-07-13 NOTE — Discharge Summary (Signed)
PATIENT NAME:  Lawrence Valdez, Lawrence Valdez MR#:  [id number] DATE OF BIRTH:  1932/02/07  DATE OF ADMISSION:  03/21/2012 DATE OF DISCHARGE:  03/22/2012  NOTE: Pending echo results.  The patient will be discharged if echo results are within normal limits.   PRIMARY CARE PHYSICIAN: Dr. Jacqualine Code   DISCHARGE DIAGNOSES: 1. Syncope, likely due to vasovagal.  2. Acute bronchitis.  3. Hypothyroidism.  4. Hypertension.  5. Hyperlipidemia.  6. History of vertigo.  7. Arthritis.   DISCHARGE MEDICATIONS:  1. Vitamin D3, 2000 units 2 tablets daily.  2. Tramadol 50 mg every 8 hours as needed.  3. Tamsulosin 0.4 mg daily.  4. Pravastatin 20 mg p.o. at bedtime.  5. MiraLax as needed.  6. Loratadine 10 mg daily.  7. Levothyroxine 75 mcg p.o. daily.  8. HCTZ 12.5 mg p.o. daily.  9. Celexa 20 mg daily.  10. Aspirin 325 mg p.o. daily.  11. BuSpar 5 mg p.o. b.i.d.   New Medications:  1. Levaquin 500 mg p.o. daily for 5 days.  2. Prednisone dose tapering 40 mg daily for 2 days, 30 mg daily for 2 days, 20 mg daily for 2 days, 10 mg daily for 2 days.  3. Combivent MDI 2 puffs every 6 hours p.r.n. for wheezing and use it for a week.  FOLLOWUP: Follow up with Dr. Jacqualine Code about in a week.   HOSPITAL COURSE: The patient is a 81 year old male with history of hypertension, hyperlipidemia, hypothyroidism and benign prostatic hypertrophy, admitted yesterday for syncope. Look at the history and physical for full details. When he went to the primary doctor's office, his blood pressure was 80/50.  He was sent to the ER, and in the ER blood pressure also was 71/50. The patient received fluid boluses. He was admitted to the hospitalist service for syncope. The patient has been having some cough, and the patient's chest x-ray at admission showed no acute changes. He was admitted for syncope. He started to have dehydration and hypotension. He was started on IV fluids, and the patient was monitored on telemetry. He got a syncope  workup including carotid ultrasound, echocardiogram. The patient's corrected ultrasound showed no hemodynamically significant stenosis. The patient's CT of the head did not show any strokes. 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 Echocardiogram results are pending. . Blood cultures were negative. Troponins have been negative x 3. The patient's CK, CPK-MB are normal.  He had BUN of 13 on admission, creatinine 1.32, so he was dehydrated. He was started on IV fluids. His BUN and creatinine today are BUN 17, creatinine 1.15. The patient denies any complaints except a cough, and his LDL is 90.0.  The patient's WBC is down to 4.7, hemoglobin 12.9 and hematocrit 36.7.   The patient's vital signs are temperature 98.1, heart rate 62, blood pressure 130/85, sats 93% on room air. The patient has been wheezing bilaterally, and he started on Levaquin for bronchitis; but during my exam he was wheezing bilaterally, so I started him on nebulizers, and one dose of Solu-Medrol was given 80 mg. He will be going home with a tapering course of prednisone along with Combivent. The patient needs to see Dr. Jacqualine Code in a week or so for followup. The patient will also take Mucinex as needed to help him to get the phlegm out. The patient's condition is stable.   TIME SPENT ON DISCHARGE PREPARATION: More than 30 minutes.   Pending physical therapy consult and also  echocardiogram. If they are within normal limits, he will be discharged home.   ____________________________ Epifanio Lesches, MD sk:cb D: 03/22/2012 10:32:59 ET T: 03/22/2012 10:54:28 ET JOB#: [id number] cc: Epifanio Lesches, MD, <Dictator> Milinda Pointer. Jacqualine Code, MD Epifanio Lesches MD ELECTRONICALLY SIGNED 04/18/2012 9:03

## 2014-07-13 NOTE — H&P (Signed)
PATIENT NAME:  Lawrence Valdez, Lawrence Valdez MR#:  [id number] DATE OF BIRTH:  10-Aug-1931  DATE OF ADMISSION:  03/21/2012  PRIMARY CARE PHYSICIAN: Debbora Dus, MD  REFERRING PHYSICIAN: Lavonia Drafts, MD  CHIEF COMPLAINT: Syncope and cough.   HISTORY OF PRESENT ILLNESS: The patient is a 81 year old Caucasian male with a past medical history of hypothyroidism, hyperlipidemia, hypertension, remote history of vertigo, history of bradycardia status post pacemaker insertion, depression and history of stroke with no deficits who is presenting to the ER after he sustained a syncopal episode yesterday. The patient was reporting that he has been having nonproductive cough for the past one week, since Christmas. The cough is not getting better and then yesterday he started feeling dizzy and when he got out of the bed to go to the bathroom he was very unsteady and wobbly and felt dizzy and almost passed out. His partner got to him and tried to put him in a chair, but as she was not able to balance him they both fell on the floor. The patient denies any injury. His partner is reporting that he was on the floor for approximately one minute and then eventually spontaneously regained consciousness. He denies any head injuries or traumas. The patient went to see his primary care physician regarding his syncopal episode yesterday and his blood pressure was very low, at his PCP's place, somewhere around 80/50 regarding which the patient was sent over to the Emergency Room for further evaluation. In the ER, the patient's blood pressure was as low as 79/50 and he was given fluid boluses. CAT scan of the head was done which is pending. The patient is reporting that he has poor appetite and decreased eating and drinking for the past few days because of his cough. Influenza test was done in the ER, which was negative. The patient denies any chest pain or shortness of breath and his creatinine was found to be 1.3 with a BUN of 13 in the ER.  White count was normal at 4500 and platelet count is low at 104,000. Chest x-ray has revealed no acute findings. Initial set of cardiac enzymes are negative and hospitalist team is called to admit the patient regarding syncopal episode and hypotension. During my examination, the patient is still feeling dizzy when he sits up. He denies any chest pain, headache, shortness of breath, difficulty in swallowing or speech difficulties. No similar complaints in the past. His partner is at bedside. No other complaints. The patient is reporting that he feels congested and coughing a lot. He denies any fevers.    PAST MEDICAL HISTORY:  1. Hypertension. 2. Hypothyroidism. 3. Hyperlipidemia. 4. Hemorrhoids. 5. History of stroke with no deficits. 6. Depression. 7. Hypothyroidism. 8. History of bradycardia status post pacemaker placement. 9. Long-standing history of vertigo. 10. Questionable transient ischemic attack. 11. Cataracts. 12. Arthritis.   PAST SURGICAL HISTORY:  1. Pacemaker placement. 2. Bilateral cataract repair. 3. Left knee replacement.  4. Testicular repair.  5. Hernia repair.  6. Appendectomy.  7. Rotator cuff repair on the right side.  DRUG ALLERGIES: No known drug allergies.   HOME MEDICATIONS: 1. Vitamin D3 2000 units 2 tablets p.o. once a day. 2. Tramadol 50 mg p.o. q. 8 hours as needed. 3. Tamsulosin 0.4 mg once a day. 4. Pravastatin 20 mg p.o. at bedtime.  5. Multivitamin 1 tablet p.o. once daily. 6. MiraLax 1 capsule p.o. once a day. 7. Loratadine 10 mg once a day. 8. Levothyroxine 75 mcg once a day.  9. Hydrochlorothiazide 1 tablet p.o. once a day.  10. Fish oil 1 capsule once a day.  11. Citalopram 20 mg p.o. once a day. 12. Aspirin 325 mg p.o. once a day. 13. BuSpar 5 mg 2 times a day. 14. Tylenol tablet p.o. 2 times a day.   PSYCHOSOCIAL HISTORY: Denies any history of smoking, alcohol or illicit drug usage. The patient used to smoke and quit smoking several  years ago. The patient lives with his partner.   FAMILY HISTORY: Mother died at age 53 with history of myocardial infarction. Father died at age 45 of old age. His younger sister has history of Alzheimer's dementia.  REVIEW OF SYSTEMS:  CONSTITUTIONAL: The patient denies any fever but complaining of fatigue and weakness. Denies any pain. Denies any weight loss or weight gain.   EYES: Denies blurry vision, inflammation, glaucoma or redness.   ENT: Denies tinnitus, ear pain, discharge, postnasal drip, difficulty in swallowing or redness of oropharynx.   RESPIRATORY: Complaining of nonproductive cough for the past one week. Denies any dyspnea, asthma, painful respiration, pneumonia or shortness of breath.   CARDIOVASCULAR: Denies any chest pain or palpitations. Sustained one syncopal episode yesterday. Denies any varicose veins or palpitations.   GASTROINTESTINAL: Denies nausea, vomiting, diarrhea, abdominal pain, rectal bleeding, constipation or GERD.   GENITOURINARY: Denies dysuria, hematuria or renal calculi.   ENDOCRINE: Denies polyuria, polyphagia, polydipsia or nocturia. The patient has history of hypothyroidism.  HEMATOLOGIC/LYMPHATIC: Denies any anemia or easy bruising.   MUSCULOSKELETAL: Denies any pain in the neck, back, or  shoulders. No joint swelling or arthritis.   NEUROLOGIC: Denies any weakness, dysarthria, ataxia, dementia or headache. Has vertigo. No seizures.   PSYCH: Denies any insomnia, ADD, bipolar disorder or nervousness.   PHYSICAL EXAMINATION:  VITAL SIGNS: Temperature 98.7, pulse 72, respiratory rate 18, blood pressure 109/69 and pulse oximetry 95 to 96% on room air.   GENERAL APPEARANCE: Not in acute distress, well-built and well-nourished, answering questions appropriately.   HEENT: Normocephalic, atraumatic. Pupils are equally reacting to light and accommodation. Extraocular movements are intact. No conjunctival injection. No scleral icterus. No  discharge from the ears. Tympanic membranes are intact. No postnasal drip. No pharyngeal edema. Dry mucous membranes.  NECK: Supple. No JVD. No thyromegaly. No carotid bruits.   RESPIRATORY: Lungs are clear to auscultation bilaterally. Good air entry. No crackles. No wheezing. No accessory muscle usage.   CARDIOVASCULAR: Normal S1 and S2. Regular rate and rhythm. No anterior chest wall tenderness. No peripheral edema.   ABDOMEN: Soft. Bowel sounds are positive in all four quadrants. Nontender. Nondistended. No masses felt.   NEUROLOGIC: Awake, alert and oriented x 3. Cranial nerves II through XII are grossly intact. No cerebellar signs. Finger-nose test is normal and dysdiadochokinesia (DDK) is normal. Gait is not checked as the patient is feeling dizzy.   MUSCULOSKELETAL: No joint effusion or tenderness is noticed.   EXTREMITIES: No cyanosis. No clubbing. No edema.   SKIN: Warm to touch. No rashes and no lesions noticed.   LABS/RADIOLOGIC STUDIES: Influenza A and B tests are negative.   CT of the head is done which is pending.   Glucose is 141, BUN 13, creatinine 1.32, sodium 140, potassium 3.6, chloride 107 and CO2 28. GFR is 51. Anion gap is 5. Serum osmolality is 282. Calcium is 8.7. WBC is 4.5, hemoglobin 12.9, hematocrit 38.0, platelet count 104,000 and MCV 88.  ASSESSMENT AND PLAN: A 81 year old male presenting to the ER after he sustained a syncopal  episode yesterday presenting with hypotension and cough for one week and will be admitted with the following assessment and plan.  1. Syncope most likely from dehydration which has led to hypotension. We will provide him hydration with IV fluids, half normal saline with 20 KCl and cycle cardiac biomarkers. We will monitor him on telemetry. We will do syncope work-up with carotid Dopplers and 2-D echocardiogram. CT of the head is pending at this time. We will get neuro checks and orthostatics. We will provide the patient aspirin and a  statin with close monitoring of the platelet count as the patient is thrombocytopenic.  2. Acute kidney injury from dehydration which led to hypotension. We will provide him IV fluids and avoid nephrotoxins. We will hold off on hydrochlorothiazide.  3. Acute bronchitis. We will give him IV levofloxacin and pharmacy to dose.  4. Thrombocytopenia. Probably from upper respiratory tract infection/acute viral syndrome. We will give him aspirin 81 mg and monitor platelet count closely.  5. Hypothyroidism. Continue Synthroid.  6. History of bradycardia status post permanent pacemaker. We will continue close monitoring on telemetry.  Time spent on admission is 60 min ____________________________ Nicholes Mango, MD ag:sb D: 03/21/2012 19:04:33 ET T: 03/22/2012 07:52:10 ET JOB#: [id number]  cc: Nicholes Mango, MD, <Dictator> Nicholes Mango MD ELECTRONICALLY SIGNED 03/24/2012 22:53

## 2014-07-13 NOTE — Discharge Summary (Signed)
PATIENT NAME:  Lawrence Valdez, Lawrence Valdez MR#:  [id number] DATE OF BIRTH:  October 19, 1931  DATE OF ADMISSION:  08/04/2012 DATE OF DISCHARGE:  08/07/2012  ADMITTING DIAGNOSIS: Right hip osteoarthritis.   DISCHARGE DIAGNOSIS: Right hip osteoarthritis.   PROCEDURE: Right total hip replacement.   SURGEON: Laurene Footman, MD   ASSISTANT: April Berndt, NP   ANESTHESIA: Spinal.   ESTIMATED BLOOD LOSS: 750 mL.   COMPLICATIONS: None.   SPECIMEN: Femoral head.   IMPLANTS: Medacta AMIS-H  stem size 2 standard with a 50 mm Versafit cup, DM liner with and M28 head with standard offset.   HISTORY OF PRESENT ILLNESS: The patient is a 81 year old with significant hip degenerative arthritis. He had a prior injection by Dr. Mali Smith. He has a history of prior left total knee replacement. He is having right groin pain with activity and pain at rest. He sometimes has difficulty sleeping secondary to this. He had significant relief for a short time with the injection to the right hip by Dr. Mali Smith.  He is wanting a right hip replacement.   PHYSICAL EXAMINATION:  RIGHT HIP:  On exam, he has 0-degree internal rotation of the hip, 20-degree external rotation. He has an approximately 10-degree flexion contracture. Distally, he is neurovascularly intact except for diminished sensation of the lateral thigh which has been present for some time.   LUNGS: Clear to auscultation.  HEART: Regular rate and rhythm. He has upper and lower dentures.  NECK: Range of motion is good.   HOSPITAL COURSE: The patient was admitted to the hospital on 08/04/2012. He had surgery that same day and was brought to the orthopedic floor from the PACU unit in stable condition. On postoperative day 1, the patient had acute postop blood loss anemia. Blood pressure was low, and this was thought to be due to spinal anesthesia. All blood pressure medications were held. The patient was asymptomatic with physical therapy. On postoperative day 2, the  patient developed chest pain with some abdominal pain. CT chest for PE was negative for pulmonary embolism. He did have a large hiatal hernia, and there were multiple gallstones present. The patient also had troponins which were negative. He later had an ultrasound abdomen limited survey which showed there were gallstones in the bladder but no evidence of cholecystitis. The patient's abdominal and chest pain did improve overnight. On postoperative day 2, the patient's hemoglobin and labs, as well as vital signs remained stable and blood pressure began to normalize. On postoperative day 3, the patient tolerated physical therapy very well. He was asymptomatic and ready for discharge home with home health physical therapy.   CONDITION AT DISCHARGE: Stable.   DISCHARGE INSTRUCTIONS: He may gradually increase weight-bearing on the affected extremity. Thigh-high TED hose on both legs and remove at bedtime, replace on arising the next morning. Use incentive spirometry every hour while awake and encourage cough and deep breathing. He may resume a regular diet as tolerated. Apply an ice pack to the affected area. Keep the dressing clean and dry. Call [facility] if the dressing gets water under it, leave the dressing on. Call [facility] if any of the following occur: Bright red bleeding from the incision wound, fever above 101.5 degrees, redness, swelling or drainage at the incision. Call [facility] if you experience any increased leg pain, numbness or weakness in legs or bowel or bladder symptoms. He is referred to home physical therapy. Call [facility] if a therapist has not  contacted him within 48 hours of his return home. Call [facility] for a follow-up appointment in 2 weeks.   DISCHARGE MEDICATIONS:  Loratadine 10 mg oral tablet 1 tablet orally once a day, Citalopram 20 mg oral tablet 1 tablet orally once a day, HCTZ/lisinopril  12.5 mg/10 mg oral tablet 1 tablet orally once a day, levothyroxine 75 mcg oral tablet 1 tablet orally once a day, Pravastatin 20 mg oral tablet 1 tablet orally once a day at bedtime, Multivitamin 1 tab orally once a day, Vitamin D3 2000 international units oral tablet 2 tabs orally once a day, tramadol 50 mg oral tablet 1 tablet orally 3 times a day as needed for pain, MiraLax oral powder for reconstitution 1 cap orally once a day, Buspirone 5 mg oral tablet 1 tablet orally 2 times a day as needed, finasteride 5 mg oral tablet 1 tablet orally once a day at bedtime, fluticasone nasal 50 mcg inhalation nasal spray 2 sprays nasally once  a day, fish oil 500 mg oral capsule 2 doses orally once a day, melatonin 5 mg oral tablet 1 tablet orally once at bedtime, Tylenol 500 mg oral tablet 1 tab orally every 4 hours as needed for pain or temperature greater than 100.4, oxycodone 5 mg oral tablet 1 tablet orally every 4 hours as needed for pain, magnesium hydroxide 8% oral suspension 30 mL orally 2 times a day as needed for constipation, aluminum hydroxide/magnesium hydroxide/simethicone 400 mg/400 mg/40 mg per 5 mL oral suspension 30 mL orally every 6 hours as needed for indigestion or heartburn, Xarelto 10 mg oral tablet 1 tablet orally once a day in the morning for 14 days, aspirin 325 mg oral delayed-release tablet 1 tablet orally once a day, Bisacodyl 10 mg rectal suppository 1 suppository rectally once a day as needed for constipation, docusate/Senna 50 mg/8.6 mg oral tablet 1 tablet orally 2 times a day.   ____________________________ Duanne Guess, PA-C tcg:cb D: 08/07/2012 10:07:12 ET T: 08/07/2012 20:47:32 ET JOB#: [id number]  cc: Duanne Guess, PA-C, <Dictator> Duanne Guess PA ELECTRONICALLY SIGNED 08/09/2012 10:25

## 2014-07-13 NOTE — Op Note (Signed)
PATIENT NAME:  Lawrence Valdez, Lawrence Valdez MR#:  [id number] DATE OF BIRTH:  [birth year]  DATE OF PROCEDURE:  08/04/2012  PREOPERATIVE DIAGNOSIS:  Right hip osteoarthritis.  POSTOPERATIVE DIAGNOSIS:  Right hip osteoarthritis.   PROCEDURE:  Right total hip replacement.   SURGEON:  Laurene Footman, M.D.   ASSISTANT:  Francena Hanly, nurse practitioner.   ANESTHESIA:  Spinal.   DESCRIPTION OF PROCEDURE: The patient was brought to the operating room and after adequate spinal anesthesia was obtained, the patient was placed on the operative table with left leg in a well legholder, right leg in the Medacta attachment. C-arm was brought in and good visualization of the head was obtained with preprocedure picture obtained of the head for length check. After the hip was prepped and draped in the usual sterile manner, appropriate patient identification and timeout procedures were completed. Anterior approach was made to the hip, centered over the greater trochanter, approximately 7 cm incision. The skin and subcutaneous tissue were divided, and the tensor muscle fascia incised and the muscle belly retracted laterally. The rectus fascia was then incised, and the circumflex artery and veins were clamped and tied off. The anterior fat pad was excised off the capsule and a capsulotomy then carried out, and deep Charnley retractor placed. Femoral neck cut was carried out at this time, and the head removed. Inspection revealed approximately a quarter had exposed bone, and it appeared to be mostly central arthritis. The acetabulum had corresponding significant central arthritis. Labrum was excised, along with the ligamentum, and a 48 mm reamer followed by a 50 mm reamer were used to get down to bleeding bone, and the 50 trial fit well.  The 50 mm dual mobility cup was then placed, impacted and felt very stable with appropriate anteversion. Next, the leg was externally rotated and release was carried out to allow mobilization of the  femur. The leg was dropped into extension with external rotation and abduction. A box osteotome was initially used, followed by a rasp. The zero rasp seemed to give a tight fit, but on trial it looked small, and the femoral neck was recut and sequential broaching to a #2 gave a very tight fill and trialing off of this appeared to give appropriate leg length with the medium head.  The #2 stem was then inserted with the medium head, and a 50 mm liner, and the hip was reduced. It was stable through a range of motion. Leg lengths appeared approximately equal to the preop template. The wound was thoroughly irrigated, and the wound infiltrated with a combination of morphine, Toradol and Sensorcaine. The deep fascia was repaired using a running quill suture, 2-0 quill subcutaneously, and skin staples. Xeroform, 4 x 4's, ABD  and tape applied, and the patient was sent to the recovery room in stable condition.   ESTIMATED BLOOD LOSS: 750 mL.   COMPLICATIONS: None.   SPECIMENS: Femoral head.   IMPLANTS: Medacta AMIS H stem size 2 standard with a 50 mm Versafitcup, DM liner with M28 head with standard offset     ____________________________ Laurene Footman, MD mjm:dmm D: 08/04/2012 21:32:58 ET T: 08/04/2012 21:59:36 ET JOB#: [id number]  cc: Laurene Footman, MD, <Dictator> Laurene Footman MD ELECTRONICALLY SIGNED 08/05/2012 8:07

## 2014-07-13 NOTE — Discharge Summary (Signed)
PATIENT NAME:  Lawrence Valdez, DEANS MR#:  [id number] DATE OF BIRTH:  1932/01/14  DATE OF ADMISSION:  03/21/2012 DATE OF DISCHARGE:  03/26/2011  NOTE: Please look at the discharge summary done by me on December 31st.  HISTORY OF PRESENT ILLNESS: The patient is still in the hospital because the patient has severe wheezing and also cough. So we continued her on nebulizers, steroids and antibiotics. We checked oxygen saturations on room air, on exertion and also at rest. The patient's oxygen saturations at rest are 92% and on exertion 94% on room air, so he did not need any oxygen to go home with. His vitals are stable today. His cough is much better. Lungs are also clear. So he will be discharged home today.  DISCHARGE MEDICATIONS: 1. Levaquin 500 mg daily for 5 days. 2. Prednisone dose tapering as I dictated in my previous discharge summary. 3. Combivent 2 puffs every 6 hours p.r.n. for wheezing. 4. Tussionex for cough 15 mL 2 times a day.  DISCHARGE INSTRUCTIONS: He is to see [facility] on Monday. The patient's condition is stable. The patient will be going home with home physical therapy. The patient chose home physical therapy so he will going home and he will resume the home physical therapy.   TIME SPENT: More than 30 minutes including previous dictations, doing previous dictation and reviewing all the records and medications.  ____________________________ Epifanio Lesches, MD sk:sb D: 03/25/2012 09:37:02 ET T: 03/25/2012 09:43:57 ET JOB#: [id number]  cc: Epifanio Lesches, MD, <Dictator> Epifanio Lesches MD ELECTRONICALLY SIGNED 04/18/2012 9:03

## 2014-07-14 NOTE — Op Note (Signed)
PATIENT NAME:  Lawrence Valdez, Lawrence Valdez MR#:  [id number] DATE OF BIRTH:  March 27, 1931  DATE OF PROCEDURE:  07/25/2013  PREOPERATIVE DIAGNOSIS: Left inguinal hernia.   POSTOPERATIVE DIAGNOSIS: Left inguinal hernia, sliding variety.   OPERATION: Repair of left inguinal hernia.   SURGEON: S.G. Jamal Collin, M.D.   ANESTHESIA: General.   COMPLICATIONS: None.   ESTIMATED BLOOD LOSS: Less than 20 mL.   DRAINS: None.   DESCRIPTION OF PROCEDURE: This patient was put to sleep with an LMA, and the left groin was then prepped and draped out as a sterile field, along with the scrotum and the penis for adequate exposure. The timeout procedure was performed. An incision was made along the medial two-thirds of the inguinal canal after instillation of 10 mL of 0.5% Marcaine in this area. The incision was deepened through the layers down to the external oblique and bleeding controlled with cautery and ligatures of 3-0 Vicryl. The external oblique was then opened along the line of its fibers. Noted the external ring was extremely enlarged with the presence of a hernia going towards the scrotum adjoining the spermatic contents. This entire area was gradually reduced after the external ring was opened and noted on further dissection that the patient, in fact, had a sliding-type hernia with the sigmoid colon actually forming part of the wall. The hernial protrusion was located a little bit medial to the cord structures at the internal ring site. After it was freed all the way down to the internal ring area, it was noted that the actual opening size of the ring was almost 2 fingerbreadths. The hernial protrusion was satisfactorily reduced, and the overlying tissue was then plicated with a 2-0 PDS running stitch in order to narrow this to less than a fingerbreadth size. After this was achieved, the fascia covering the cord was reapproximated with a running 2-0 Vicryl stitch. The posterior wall was satisfactorily exposed. A ProGrip mesh  was then brought up to the field. It was then placed around the cord and laid down to the underlying posterior wall with the lateral ends tucked underneath the external oblique. This covered the entire posterior wall satisfactorily and narrowed the internal ring area to less than a fingerbreadth. The medial end of this was tacked to the pubic tubercle with a single 2-0 PDS stitch. The wound was irrigated and closed. External oblique closed with running 2-0 PDS, the subcutaneous tissue with 3-0 Vicryl and the skin closed with subcuticular 4-0 Vicryl, covered with Dermabond. The procedure was well tolerated, and he was subsequently extubated and returned to the recovery room in stable condition.   ____________________________ S.Robinette Haines, MD sgs:gb D: 07/25/2013 15:44:33 ET T: 07/26/2013 01:59:21 ET JOB#: [id number]  cc: S.G. Jamal Collin, MD, <Dictator> [facility] Robinette Haines MD ELECTRONICALLY SIGNED 07/26/2013 8:56

## 2014-07-14 NOTE — Consult Note (Signed)
Urology Procedure Note: Reduction of paraphimosis Dx: ParaphimosisDx: Same Clinical Note: 81 y.o. WM patient of Dr. Elnoria Howard, [facility], with urinary retention since 02/2013. Pt underwent a voiding trial 3 days ago at Dr. Guinevere Ferrari office. Pt was unable to void and had foley catheter replaced.  Pt is uncircumcised and reports that the foreskin was not reduced after foley catheter placement.  Pt presents now with progressive edema of the glans/retracted foreskin with pain.  Denies F/C.  Examination reveals paraphimosis with moderate preputial/glanular edema.  Gentle compression applied to the penile glans and retracted prepuce with successful gradual manual reduction of the prepuce over the glans.  Pt reports immediate relief of pain.  No retraction of the foreskin for 3 days.Clean under the foreskin with a cotton-tipped applicator daily.May resume normal hygiene with daily retraction of the foreskin and cleaning with mild soap - pt must reduce foreskin back over the glans after cleaning.Follow-up with Dr. Elnoria Howard, as scheduled.   Electronic Signatures: Darcella Cheshire (MD)  (Signed on 24-Jan-15 14:53)  Authored  Last Updated: 24-Jan-15 14:53 by Darcella Cheshire (MD)

## 2014-07-14 NOTE — Op Note (Signed)
PATIENT NAME:  GREGORIO, Lawrence Valdez MR#:  [id number] DATE OF BIRTH:  [birth year]  DATE OF PROCEDURE:  12/18/2013  PREOPERATIVE DIAGNOSIS: Vesical outlet obstruction secondary to benign prostatic hypertrophy with lower urinary tract symptoms.  POSTOPERATIVE DIAGNOSIS:  Vesical outlet obstruction secondary to benign prostatic hypertrophy with lower urinary tract symptoms with trilobar hypertrophy of the prostate as a cause and a small bladder stone present.   SURGEON: Rick Duff, DO  PROCEDURE: KTP laser prostatectomy.  COMPLICATIONS: None.   ESTIMATED BLOOD LOSS: Zero.   DESCRIPTION OF PROCEDURE: With the patient sterilely prepped and draped in the supine lithotomy position for ease of approach to the external genitalia, we have an appropriate timeout, which is agreed upon by all 3 branches of the OR staff. Then the distal urethra is dilated at the meatus to 30-French with a [employer] sound as it is tight. Then the 22-French sheath is inserted into the urethra and no strictures are seen. The bladder has trilobar hypertrophy with middle lobe prominent. Upon looking past the middle lobe, I see a bladder stone. However, we do not have the laser capability today of stone destruction so I am going to wait. The laser prostatectomy was begun utilizing the greenlight laser probe with a filter. The middle lobe was done first and then with rapid movement I progressed down the lateral lobes. Eventually I completely hollow out the prostate. The KTP laser was kept 1 to 3 mm off the tissue. This allows the extreme width of the laser beam to completely ablate the tissue. The settings are 80 and 100 watts. The lasing time is 16 minutes and 18 seconds. The joules are 102,333. The patient tolerates this well and has an excellent result. No penetration beyond the verumontanum is achieved. By the end of the procedure, I am able to lase completely circumferentially with the beam just off the tissue. There is no bleeding  throughout the procedure. At the end of the procedure, I just put a Foley catheter in; it is a 24-French two-way. Clear urine is present. There is absolutely no bleeding. Just before I put the catheter in, I left the bladder full and the patient voided beautifully and spontaneously on the table without even any pressure on the lower abdomen and bladder to create any stream. The balloon is left in the bladder and there is 15 mL in the Foley catheter balloon. Then 30 mL of Sensorcaine is placed in the bladder and a B and O suppository in the rectum. There is a little nodularity to the prostate, but it is tiny. We will check his PSA in the intervening period of time. The rectum itself shows no masses. ____________________________ Janice Coffin. Elnoria Howard, DO rdh:sb D: 12/18/2013 08:13:20 ET T: 12/18/2013 08:58:59 ET JOB#: [id number]  cc: Janice Coffin. Elnoria Howard, DO, <Dictator> RICHARD D HART DO ELECTRONICALLY SIGNED 01/12/2014 14:08

## 2014-07-22 NOTE — Op Note (Signed)
PATIENT NAME:  Lawrence Valdez, Lawrence Valdez MR#:  [id number] DATE OF BIRTH:  20-Nov-1931  DATE OF PROCEDURE:  03/26/2014  PREOPERATIVE DIAGNOSIS: Bladder calculus.   POSTOPERATIVE DIAGNOSIS: Bladder calculus.  PROCEDURE: Cystoscopy with cystolithotripsy of a bladder calculus; 2.5 cm spiculated calculus.   SURGEON: Richard D. Elnoria Howard, DO  ANESTHESIA: General.   DESCRIPTION OF PROCEDURE: The patient was sterilely prepped and draped in the supine lithotomy position for ease of approach to the external genitalia. I began the procedure. Cystoscopy was done. He had 1 large caliber stricture at the bulbous urethra. The prostate shows the effects of a KTP laser in his middle lobe. He has a good opening at the middle lobe. He has some lateral lobe enlargement, but it is non obstructive. A large spiculated stone is seen in the posterior portion of the bladder to the right kind, and kind of a large, not really diverticulum, but more of a large early diverticulum that is wide open with no small entrance way into it. It just looks like a diverticulum, secondary to his outlet obstruction in the past.   The spiculated stone is causing quite a bit of irritation in the bladder. It is easily destroyed with a 900 micron holmium direct firing laser. Pieces are evacuated with a [place] syringe without difficulty. The patient's bladder was emptied then and 30 mL of 0.5% plain Marcaine were placed in the bladder and a B and O suppository in the rectum. He was sent to recovery in satisfactory condition.   There was no bleeding during the case, and the patient did very well.    ____________________________ Janice Coffin. Elnoria Howard, [place] rdh:MT D: 03/26/2014 09:02:33 ET T: 03/26/2014 09:29:30 ET JOB#: [id number]  cc: Janice Coffin. Elnoria Howard, DO, <Dictator> RICHARD D HART DO ELECTRONICALLY SIGNED 03/30/2014 14:42

## 2014-07-24 ENCOUNTER — Ambulatory Visit: Payer: No Typology Code available for payment source | Admitting: Pain Medicine

## 2014-08-13 ENCOUNTER — Ambulatory Visit
Admission: RE | Admit: 2014-08-13 | Discharge: 2014-08-13 | Disposition: A | Discharge status: Home / Self Care | Payer: PPO | Source: Ambulatory Visit | Attending: Family Medicine | Admitting: Family Medicine

## 2014-08-13 ENCOUNTER — Other Ambulatory Visit: Payer: Self-pay | Admitting: Family Medicine

## 2014-08-13 DIAGNOSIS — M25561 Pain in right knee: Secondary | ICD-10-CM

## 2014-08-28 ENCOUNTER — Telehealth: Payer: Self-pay | Admitting: Family Medicine

## 2014-08-28 MED ORDER — LISINOPRIL 10 MG PO TABS: 10.0000 mg | ORAL_TABLET | Freq: Every day | ORAL | Status: DC

## 2014-08-28 MED ORDER — LEVOTHYROXINE SODIUM 75 MCG PO TABS: 75.0000 ug | ORAL_TABLET | Freq: Every day | ORAL | Status: DC

## 2014-08-28 NOTE — Telephone Encounter (Signed)
PT BLOOD PRESSURE WAS 154/101 THIS MORNING AND THAT THE DOCTOR NEVER REFILLED HIS BLOOD PRESSURE MEDS AND HE THINKS THAT HE NEEDS TO GO BACK ON IT. PT DOES NOT WANT TO HAVE TO COME IN. [place].

## 2014-08-28 NOTE — Telephone Encounter (Signed)
Please advise about patients HTN and medications

## 2014-08-28 NOTE — Telephone Encounter (Signed)
According to allscripts chart lisinopril was recorded but never sent.  I refilled lisinopril and patients TSH meds for him and he has follow up ap[pt in July.

## 2014-10-11 ENCOUNTER — Encounter: Payer: Self-pay | Admitting: Family Medicine

## 2014-10-11 ENCOUNTER — Ambulatory Visit (INDEPENDENT_AMBULATORY_CARE_PROVIDER_SITE_OTHER): Payer: PPO | Admitting: Family Medicine

## 2014-10-11 ENCOUNTER — Encounter (INDEPENDENT_AMBULATORY_CARE_PROVIDER_SITE_OTHER): Payer: Self-pay

## 2014-10-11 VITALS — BP 128/79 | HR 89 | Temp 98.2°F | Resp 18 | Ht 67.0 in | Wt 198.5 lb

## 2014-10-11 DIAGNOSIS — I10 Essential (primary) hypertension: Secondary | ICD-10-CM

## 2014-10-11 DIAGNOSIS — E785 Hyperlipidemia, unspecified: Secondary | ICD-10-CM | POA: Diagnosis not present

## 2014-10-11 MED ORDER — PRAVASTATIN SODIUM 20 MG PO TABS: 20.0000 mg | ORAL_TABLET | Freq: Every day | ORAL | Status: DC

## 2014-10-11 MED ORDER — LISINOPRIL 10 MG PO TABS: 10.0000 mg | ORAL_TABLET | Freq: Every day | ORAL | Status: DC

## 2014-10-11 NOTE — Progress Notes (Signed)
Name: Lawrence BOLEY Sr.   MRN: [id number]    DOB: 1931-12-23   Date:10/11/2014       Progress Note  Subjective  Chief Complaint  Chief Complaint  Patient presents with  . Follow-up    3 mo.  . Hypertension  . Hyperlipidemia  . Medication Refill    Hypertension This is a chronic problem. The problem is controlled. Pertinent negatives include no blurred vision, chest pain, headaches, palpitations or shortness of breath. Past treatments include ACE inhibitors. There are no compliance problems.  Hypertensive end-organ damage includes CVA. There is no history of kidney disease or CAD/MI.  Hyperlipidemia This is a chronic problem. The problem is controlled. Recent lipid tests were reviewed and are normal. He has no history of diabetes, liver disease or obesity. Pertinent negatives include no chest pain, leg pain, myalgias or shortness of breath. Current antihyperlipidemic treatment includes statins. There are no compliance problems.     Past Medical History  Diagnosis Date  . Hypertension   . Pacemaker   . Hypothyroidism   . Chronic kidney disease     enlarged prostrate  . Depression   . Dysrhythmia   . Arthritis   . Hypothyroid   . Bradycardia   . Vertigo     Past Surgical History  Procedure Laterality Date  . Insert / replace / remove pacemaker      dr Ubaldo Glassing  medtronic     09  . Cardiac catheterization      09  . Appendectomy    . Thumbs      trigger thumbs  . Cataract extraction    . Lumbar laminectomy/decompression microdiscectomy  08/06/2011    Procedure: LUMBAR LAMINECTOMY/DECOMPRESSION MICRODISCECTOMY 2 LEVELS;  Surgeon: Eustace Moore, MD;  Location: [place] [facility];  Service: Neurosurgery;  Laterality: N/A;  Lumbar two three, lumbar three four decompressive laminectomy  . Partial hip arthroplasty Right 2014  . Hernia repair  20 years ago    umbiluical   . Hernia repair Left 07-25-13    inguinal  . Shoulder surgery Right   . Knee surgery Left   . Joint replacement       lt knee  . Joint replacement      right hip    History reviewed. No pertinent family history.  History   Social History  . Marital Status: Widowed    Spouse Name: N/A  . Number of Children: N/A  . Years of Education: N/A   Occupational History  . Not on file.   Social History Main Topics  . Smoking status: Former Smoker    Quit date: 08/04/1961  . Smokeless tobacco: Never Used  . Alcohol Use: No     Comment: socially  . Drug Use: No  . Sexual Activity: Not on file   Other Topics Concern  . Not on file   Social History Narrative     Current outpatient prescriptions:  .  aspirin EC 325 MG tablet, Take 325 mg by mouth daily., Disp: , Rfl:  .  carbidopa-levodopa (SINEMET CR) 50-200 MG per tablet, Take 1 tablet by mouth 2 (two) times daily., Disp: , Rfl:  .  cholecalciferol (VITAMIN D) 1000 UNITS tablet, Take 4,000 Units by mouth daily., Disp: , Rfl:  .  citalopram (CELEXA) 10 MG tablet, Take 10 mg by mouth daily., Disp: , Rfl:  .  finasteride (PROSCAR) 5 MG tablet, Take 5 mg by mouth at bedtime., Disp: , Rfl:  .  fluticasone (FLONASE) 50  MCG/ACT nasal spray, Place 2 sprays into both nostrils daily., Disp: , Rfl:  .  levothyroxine (SYNTHROID, LEVOTHROID) 75 MCG tablet, Take 1 tablet (75 mcg total) by mouth daily., Disp: 30 tablet, Rfl: 2 .  lisinopril (PRINIVIL,ZESTRIL) 10 MG tablet, Take 1 tablet (10 mg total) by mouth daily., Disp: 30 tablet, Rfl: 2 .  loratadine (CLARITIN) 10 MG tablet, Take 10 mg by mouth daily., Disp: , Rfl:  .  meclizine (ANTIVERT) 25 MG tablet, Take 25 mg by mouth 3 (three) times daily as needed. For dizziness, Disp: , Rfl:  .  Melatonin 5 MG TABS, Take 1 tablet by mouth at bedtime., Disp: , Rfl:  .  Multiple Vitamin (MULITIVITAMIN WITH MINERALS) TABS, Take 1 tablet by mouth daily., Disp: , Rfl:  .  Omega-3 Fatty Acids (FISH OIL) 500 MG CAPS, Take 2 capsules by mouth daily., Disp: , Rfl:  .  polyethylene glycol (MIRALAX / GLYCOLAX) packet, Take  17 g by mouth at bedtime., Disp: , Rfl:  .  pravastatin (PRAVACHOL) 20 MG tablet, Take 20 mg by mouth daily., Disp: , Rfl:  .  Tamsulosin HCl (FLOMAX) 0.4 MG CAPS, Take 0.4 mg by mouth daily., Disp: , Rfl:  .  traMADol (ULTRAM) 50 MG tablet, Take 50 mg by mouth every 4 (four) hours as needed., Disp: , Rfl:  .  vitamin E 1000 UNIT capsule, Take 1,000 Units by mouth daily., Disp: , Rfl:   Allergies  Allergen Reactions  . No Known Allergies   . Adhesive [Tape] Itching and Other (See Comments)    blisters     Review of Systems  Eyes: Negative for blurred vision.  Respiratory: Negative for shortness of breath.   Cardiovascular: Negative for chest pain and palpitations.  Musculoskeletal: Negative for myalgias.  Neurological: Negative for headaches.      Objective  Filed Vitals:   10/11/14 1046  BP: 128/79  Pulse: 89  Temp: 98.2 F (36.8 C)  TempSrc: Oral  Resp: 18  Height: 5\' 7"  (1.702 m)  Weight: 198 lb 8 oz (90.039 kg)  SpO2: 95%    Physical Exam  Constitutional: He is oriented to person, place, and time and well-developed, well-nourished, and in no distress.  Cardiovascular: Normal rate and regular rhythm.   Pulmonary/Chest: Effort normal and breath sounds normal.  Abdominal: Soft. Bowel sounds are normal.  Neurological: He is alert and oriented to person, place, and time.  Skin: Skin is warm and dry.  Psychiatric: Affect normal.  Nursing note and vitals reviewed.    Assessment & Plan 1. Essential hypertension Blood pressure at goal on present therapy. Recheck in 3 months. - lisinopril (PRINIVIL,ZESTRIL) 10 MG tablet; Take 1 tablet (10 mg total) by mouth daily.  Dispense: 90 tablet; Refill: 0  2. Hyperlipidemia Obtain lipid panel and check liver enzymes plus kidney function today and follow-up - pravastatin (PRAVACHOL) 20 MG tablet; Take 1 tablet (20 mg total) by mouth daily.  Dispense: 90 tablet; Refill: 0 - Lipid panel - Comprehensive metabolic  panel   Blain Hunsucker [facility] 10/11/2014 11:21 AM

## 2014-10-12 LAB — COMPREHENSIVE METABOLIC PANEL
ALBUMIN: 4.1 g/dL (ref 3.5–4.7)
ALT: 4 IU/L (ref 0–44)
AST: 19 IU/L (ref 0–40)
Albumin/Globulin Ratio: 1.6 (ref 1.1–2.5)
Alkaline Phosphatase: 59 IU/L (ref 39–117)
BUN / CREAT RATIO: 12 (ref 10–22)
BUN: 12 mg/dL (ref 8–27)
Bilirubin Total: 0.3 mg/dL (ref 0.0–1.2)
CALCIUM: 9.4 mg/dL (ref 8.6–10.2)
CO2: 24 mmol/L (ref 18–29)
Chloride: 103 mmol/L (ref 97–108)
Creatinine, Ser: 1.02 mg/dL (ref 0.76–1.27)
GFR calc non Af Amer: 69 mL/min/{1.73_m2} (ref >59)
GFR, EST AFRICAN AMERICAN: 80 mL/min/{1.73_m2} (ref >59)
GLUCOSE: 105 mg/dL — AB (ref 65–99)
Globulin, Total: 2.5 g/dL (ref 1.5–4.5)
Potassium: 4.6 mmol/L (ref 3.5–5.2)
Sodium: 143 mmol/L (ref 134–144)
Total Protein: 6.6 g/dL (ref 6.0–8.5)

## 2014-10-12 LAB — LIPID PANEL
CHOL/HDL RATIO: 4.4 ratio (ref 0.0–5.0)
CHOLESTEROL TOTAL: 168 mg/dL (ref 100–199)
HDL: 38 mg/dL — AB (ref >39)
LDL CALC: 101 mg/dL — AB (ref 0–99)
Triglycerides: 145 mg/dL (ref 0–149)
VLDL CHOLESTEROL CAL: 29 mg/dL (ref 5–40)

## 2014-11-05 ENCOUNTER — Encounter: Payer: Self-pay | Admitting: Occupational Therapy

## 2014-11-05 ENCOUNTER — Ambulatory Visit: Payer: PPO | Attending: Neurology | Admitting: Occupational Therapy

## 2014-11-05 VITALS — BP 146/87 | HR 97

## 2014-11-05 DIAGNOSIS — Z741 Need for assistance with personal care: Secondary | ICD-10-CM

## 2014-11-05 DIAGNOSIS — R279 Unspecified lack of coordination: Secondary | ICD-10-CM | POA: Diagnosis present

## 2014-11-05 DIAGNOSIS — R2681 Unsteadiness on feet: Secondary | ICD-10-CM

## 2014-11-05 DIAGNOSIS — M6281 Muscle weakness (generalized): Secondary | ICD-10-CM

## 2014-11-05 DIAGNOSIS — R460 Very low level of personal hygiene: Secondary | ICD-10-CM | POA: Insufficient documentation

## 2014-11-05 DIAGNOSIS — R262 Difficulty in walking, not elsewhere classified: Secondary | ICD-10-CM | POA: Diagnosis present

## 2014-11-06 NOTE — Therapy (Signed)
[place] MAIN [facility] SERVICES [address] [place], [place], [address] Phone: [phone]   Fax:  [phone]  Occupational Therapy Evaluation  Patient Details  Name: Lawrence Valdez. MRN: [id number] Date of Birth: [birth year] Referring Provider:  Anabel Bene, MD  Encounter Date: 11/05/2014      OT End of Session - 11/06/14 1444    Visit Number 1   Number of Visits 17   Date for OT Re-Evaluation 12/12/14   OT Start Time 0830   OT Stop Time 0930   OT Time Calculation (min) 60 min      Past Medical History  Diagnosis Date  . Hypertension   . Pacemaker   . Hypothyroidism   . Chronic kidney disease     enlarged prostrate  . Depression   . Dysrhythmia   . Arthritis   . Hypothyroid   . Bradycardia   . Vertigo     Past Surgical History  Procedure Laterality Date  . Insert / replace / remove pacemaker      dr Ubaldo Glassing  medtronic     09  . Cardiac catheterization      09  . Appendectomy    . Thumbs      trigger thumbs  . Cataract extraction    . Lumbar laminectomy/decompression microdiscectomy  08/06/2011    Procedure: LUMBAR LAMINECTOMY/DECOMPRESSION MICRODISCECTOMY 2 LEVELS;  Surgeon: Eustace Moore, MD;  Location: [place] [facility];  Service: Neurosurgery;  Laterality: N/A;  Lumbar two three, lumbar three four decompressive laminectomy  . Partial hip arthroplasty Right 2014  . Hernia repair  20 years ago    umbiluical   . Hernia repair Left 07-25-13    inguinal  . Shoulder surgery Right   . Knee surgery Left   . Joint replacement      lt knee  . Joint replacement      right hip    Filed Vitals:   11/05/14 0843  BP: 146/87  Pulse: 97    Visit Diagnosis:  Muscle weakness (generalized) - Plan: Ot plan of care cert/re-cert  Lack of coordination - Plan: Ot plan of care cert/re-cert  Difficulty walking - Plan: Ot plan of care cert/re-cert  Self-care deficit for bathing and hygiene - Plan: Ot plan of care  cert/re-cert  Self-care deficit for dressing and grooming - Plan: Ot plan of care cert/re-cert  Unsteadiness - Plan: Ot plan of care cert/re-cert      Subjective Assessment - 11/05/14 0843    Subjective  Patient reports he is here to get better at moving around, doing exercises, taking care of himself.    Patient is accompained by: Family member   Special Tests ."   Patient Stated Goals "To get back to normal as possible"   Pain Score 3    Pain Location Shoulder   Pain Orientation Left   Pain Descriptors / Indicators Dull;Tingling   Pain Type Chronic pain   Pain Onset More than a month ago   Pain Frequency Constant   Multiple Pain Sites Yes   Pain Location Hip   Pain Orientation Right   Pain Descriptors / Indicators Aching   Pain Type Acute pain   Pain Onset 1 to 4 weeks ago   Pain Frequency Intermittent           [facility] OT Assessment - 11/05/14 0847    Assessment   Diagnosis Parkinson's disease   Onset Date 03/23/12   Balance Screen  Has the patient fallen in the past 6 months Yes   How many times? 3   Has the patient had a decrease in activity level because of a fear of falling?  Yes   Is the patient reluctant to leave their home because of a fear of falling?  No   Home  Environment   Family/patient expects to be discharged to: Private residence   Living Arrangements Spouse/significant other   Available Help at Discharge Family   Type of [place] One level   Bathroom Shower/Tub Tub/Shower unit;Curtain   Shower/tub characteristics Curtain   Biochemist, clinical Yes   Home Equipment [place] - single point;Hospital bed;Wheelchair - [employer] - 2 wheels;Shower seat;Grab bars - tub/shower   Lives With Spouse   Prior Function   Level of Independence Independent with household mobility with device;Needs assistance with homemaking;Needs assistance with ADLs   Vocation Retired   ADL   Eating/Feeding  Modified independent   Research scientist (physical sciences) Modified independent   Toileting - Musician Modified independent   ADL comments Patient slow to complete all tasks for self care.     IADL   Prior Level of Function Shopping independent   Shopping Needs to be accompanied on any shopping trip   Prior Level of Function Light Housekeeping independent   Light Housekeeping Needs help with all home maintenance tasks   Prior Level of Function Meal Prep simple meal prep independently.   Meal Prep Needs to have meals prepared and served   [employer] on family or friends for transportation   Medication Management Takes responsibility if medication is prepared in advance in seperate dosage   Prior Level of Function Financial Management independent   Financial Management Manages day-to-day purchases, but needs help with banking, major purchases, etc.   Mobility   Mobility Status Needs assist;History of falls;Freezing   Mobility Status Comments 6 minute walk test with cane 485 feet.   Written Expression   Dominant Hand Right   Sensation   Light Touch Appears Intact   Coordination   Gross Motor Movements are Fluid and Coordinated No   Fine Motor Movements are Fluid and Coordinated No   9 Hole Peg Test Right;Left   Right 9 Hole Peg Test 22 secs   Left 9 Hole Peg Test 29 secs   ROM / Strength   AROM / PROM / Strength AROM;Strength   AROM   Overall AROM  Deficits   Overall AROM Comments Shoulder flexion limited to 125 degrees billaterally.   Strength   Overall Strength Deficits   Overall Strength Comments 4/5 overall strength B UE/LE   Hand Function   Right Hand Grip (lbs) 63#   Right Hand Lateral Pinch 19 lbs   Right Hand 3 Point Pinch 17 lbs   Left Hand Grip (lbs) 47   Left Hand Lateral Pinch 12 lbs   Left 3 point pinch 11 lbs                         OT Education - 11/05/14 1443     Education provided Yes   Education Details LSVT BIG protocol   Person(s) Educated Patient;Spouse   Methods Explanation   Comprehension Verbalized understanding             OT Long Term Goals - 11/05/14 1450  OT LONG TERM GOAL #1   Title Patient will improve gait speed and endurance and be able to walk 600 feet in 6 minutes to negotiate around the home and community safely    Baseline 485 feet at evaluation   Time 4   Period Weeks   Status New   OT LONG TERM GOAL #2   Title Patient will complete HEP for maximal daily exercises with modified independence    Baseline no current HEP   Time 4   Period Weeks   Status New   OT LONG TERM GOAL #3   Title Patient will transfer from sit to stand without the use of arms safely and independently from a variety of chairs/surfaces.   Baseline Moderate difficulty at evaluation and requires 47 secs to complete 5 times sit to stand.   Time 4   Period Weeks   Status New   OT LONG TERM GOAL #4   Title Patient will demo increased size of print with hand writing and legibility of greater than 80% to fill out important papers.   Baseline decreased legibility to 25% with scripted handwriting at evaluation, moderate micrographia.   Time 4   Period Weeks   Status New   OT LONG TERM GOAL #5   Title Patient will decrease freezing of gait behaviors on a daily basis and reduce score on Freezing of Gait Questionairre to 12 or less to decrease risk for falls.    Baseline Patient scored 15 on FOG at Eval.   Time 4   Period Weeks   Status New   Long Term Additional Goals   Additional Long Term Goals Yes   OT LONG TERM GOAL #6   Title Will complete BERG balance test next session and add a goal for balance.     Baseline patient with mulitple falls in the past 6 months, reports decreased balance with functional tasks.   Time 4   Period Weeks   Status New               Plan - 11/05/14 1446    Clinical Impression Statement Patient is a 81  yo male diagnosed with Parkinson's disease and was referred by his physician for LSVT BIG program. Patient presents with decreased step length with gait patterns, decreased reciprocal arm swing, decreased balance, decreased coordination, muscle strength, and decreased transfers from bed, low surfaces and a history of falls, which affect his ability to perform daily tasks. The patient is judged to be an excellent candidate for the LSVT BIG program. He would benefit from and was referred for the LSVT BIG program which is an intensive program designed specifically for Parkinson's patients with a focus on increasing amplitude and speed of movements, improving self care and daily tasks and providing patients with daily exercises to improve overall function. It is recommended that the patient receive the LSVT BIG program which is comprised of 16 intensive sessions (4 times per week for 4 weeks, one hour sessions). Prognosis for improvement is good based on his motivation and strong family support. LSVT BIG has been documented in the literature as efficacious for individuals with Parkinson's disease.    Pt will benefit from skilled therapeutic intervention in order to improve on the following deficits (Retired) Abnormal gait;Decreased knowledge of use of DME;Decreased strength;Impaired flexibility;Decreased balance;Decreased mobility;Difficulty walking;Decreased range of motion;Pain;Decreased coordination;Impaired UE functional use   Rehab Potential Good   OT Frequency 4x / week   OT Duration 4 weeks   OT  Treatment/Interventions Self-care/ADL training;Therapeutic exercise;Functional Mobility Training;Patient/family education;Neuromuscular education;Balance training;DME and/or AE instruction;Gait Training;Stair Training   Plan Patient to be seen 4 times a week for 4 weeks for a total of 16 treatment sessions and one evaluation.   OT Home Exercise Plan Will issue HEP by the end of the first week of therapy.    Consulted and Agree with Plan of Care Patient;Family member/caregiver          G-Codes - November 17, 2014 1502    Functional Assessment Tool Used 6 minute walk test, 5 times sit to stand, clnical judgement   Functional Limitation Mobility: Walking and moving around   Mobility: Walking and Moving Around Current Status [phone]) At least 60 percent but less than 80 percent impaired, limited or restricted   Mobility: Walking and Moving Around Goal Status [phone]) At least 40 percent but less than 60 percent impaired, limited or restricted      Problem List Patient Active Problem List   Diagnosis Date Noted  . Hypertension 10/11/2014  . Hyperlipidemia 10/11/2014  . Idiopathic Parkinson's disease 05/02/2014   Amy T Tomasita Morrow, OTR/L, CLT Granzow,Amy 11/06/2014, 3:06 PM  [place] MAIN [facility] SERVICES [address] [place], [place], [address] Phone: [phone]   Fax:  [phone]

## 2014-11-12 ENCOUNTER — Encounter: Payer: Self-pay | Admitting: Occupational Therapy

## 2014-11-12 ENCOUNTER — Ambulatory Visit: Payer: PPO | Admitting: Occupational Therapy

## 2014-11-12 DIAGNOSIS — M6281 Muscle weakness (generalized): Secondary | ICD-10-CM | POA: Diagnosis not present

## 2014-11-12 DIAGNOSIS — R2681 Unsteadiness on feet: Secondary | ICD-10-CM

## 2014-11-12 DIAGNOSIS — R460 Very low level of personal hygiene: Secondary | ICD-10-CM

## 2014-11-12 DIAGNOSIS — Z741 Need for assistance with personal care: Secondary | ICD-10-CM

## 2014-11-12 DIAGNOSIS — R279 Unspecified lack of coordination: Secondary | ICD-10-CM

## 2014-11-12 DIAGNOSIS — R262 Difficulty in walking, not elsewhere classified: Secondary | ICD-10-CM

## 2014-11-13 ENCOUNTER — Ambulatory Visit: Payer: PPO | Admitting: Occupational Therapy

## 2014-11-13 ENCOUNTER — Encounter: Payer: Self-pay | Admitting: Occupational Therapy

## 2014-11-13 DIAGNOSIS — M6281 Muscle weakness (generalized): Secondary | ICD-10-CM | POA: Diagnosis not present

## 2014-11-13 DIAGNOSIS — R2681 Unsteadiness on feet: Secondary | ICD-10-CM

## 2014-11-13 DIAGNOSIS — R279 Unspecified lack of coordination: Secondary | ICD-10-CM

## 2014-11-13 DIAGNOSIS — Z741 Need for assistance with personal care: Secondary | ICD-10-CM

## 2014-11-13 DIAGNOSIS — R262 Difficulty in walking, not elsewhere classified: Secondary | ICD-10-CM

## 2014-11-13 DIAGNOSIS — R460 Very low level of personal hygiene: Secondary | ICD-10-CM

## 2014-11-13 NOTE — Therapy (Signed)
[place] MAIN [facility] SERVICES [address] [place], [place], [address] Phone: [phone]   Fax:  [phone]  Occupational Therapy Treatment  Patient Details  Name: Lawrence PREBLE Sr. MRN: [id number] Date of Birth: 06-20-1931 Referring Provider:  Anabel Bene, MD  Encounter Date: 11/12/2014      OT End of Session - 11/12/14 1619    Visit Number 2   Number of Visits 17   Date for OT Re-Evaluation 12/12/14   OT Start Time 0830   OT Stop Time 0928   OT Time Calculation (min) 58 min   Activity Tolerance Patient tolerated treatment well   Behavior During Therapy [facility] for tasks assessed/performed      Past Medical History  Diagnosis Date  . Hypertension   . Pacemaker   . Hypothyroidism   . Chronic kidney disease     enlarged prostrate  . Depression   . Dysrhythmia   . Arthritis   . Hypothyroid   . Bradycardia   . Vertigo     Past Surgical History  Procedure Laterality Date  . Insert / replace / remove pacemaker      dr Ubaldo Glassing  medtronic     09  . Cardiac catheterization      09  . Appendectomy    . Thumbs      trigger thumbs  . Cataract extraction    . Lumbar laminectomy/decompression microdiscectomy  08/06/2011    Procedure: LUMBAR LAMINECTOMY/DECOMPRESSION MICRODISCECTOMY 2 LEVELS;  Surgeon: Eustace Moore, MD;  Location: [place] [facility];  Service: Neurosurgery;  Laterality: N/A;  Lumbar two three, lumbar three four decompressive laminectomy  . Partial hip arthroplasty Right 2014  . Hernia repair  20 years ago    umbiluical   . Hernia repair Left 07-25-13    inguinal  . Shoulder surgery Right   . Knee surgery Left   . Joint replacement      lt knee  . Joint replacement      right hip    There were no vitals filed for this visit.  Visit Diagnosis:  Muscle weakness (generalized)  Lack of coordination  Difficulty walking  Self-care deficit for bathing and hygiene  Self-care deficit for dressing and  grooming  Unsteadiness                    OT Treatments/Exercises (OP) - 11/12/14 1618    Neurological Re-education Exercises   Other Exercises 1 LSVT Daily Session Maximal Daily Exercises: Sustained movements are designed to rescale the amplitude of movement output for generalization to daily functional activities. Performed as follows for 1 set of 10 repetitions each: Multi directional sustained movements- 1) Floor to ceiling, 2) Side to side. Multi directional Repetitive movements performed in standing and are designed to provide retraining effort needed for sustained muscle activation in tasks Performed as follows: 3) Step and reach forward, 4) Step and Reach Backwards, 5) Step and reach sideways, 6) Rock and reach forward/backward, 7) Rock and reach sideways. Each exercise performed with CGA with occasional min assist for balance recovery.  Sit to stand from mat table on lowest setting with cues for weight shift, technique and CGA for 5 reps for 2 sets. Patient seen for functional mobility tasks this date with emphasis on gait speed, length of steps with short distance ambulation, 200 feet for 3 trials using standard cane, cues for amplitude of gait, arm swing and weight shifting.  Patient also requires cues for  posture and head position during functional mobility. Patient requires cues for BIG movements and cadence                OT Education - 11/12/14 1619    Education provided Yes   Education Details LSVT BIG maximal daily exercises, protocol, BIG walking   Person(s) Educated Patient;Spouse   Methods Explanation;Demonstration;Verbal cues   Comprehension Returned demonstration;Verbalized understanding;Verbal cues required             OT Long Term Goals - 11/05/14 1450    OT LONG TERM GOAL #1   Title Patient will improve gait speed and endurance and be able to walk 600 feet in 6 minutes to negotiate around the home and community safely    Baseline 485 feet  at evaluation   Time 4   Period Weeks   Status New   OT LONG TERM GOAL #2   Title Patient will complete HEP for maximal daily exercises with modified independence    Baseline no current HEP   Time 4   Period Weeks   Status New   OT LONG TERM GOAL #3   Title Patient will transfer from sit to stand without the use of arms safely and independently from a variety of chairs/surfaces.   Baseline Moderate difficulty at evaluation and requires 47 secs to complete 5 times sit to stand.   Time 4   Period Weeks   Status New   OT LONG TERM GOAL #4   Title Patient will demo increased size of print with hand writing and legibility of greater than 80% to fill out important papers.   Baseline decreased legibility to 25% with scripted handwriting at evaluation, moderate micrographia.   Time 4   Period Weeks   Status New   OT LONG TERM GOAL #5   Title Patient will decrease freezing of gait behaviors on a daily basis and reduce score on Freezing of Gait Questionairre to 12 or less to decrease risk for falls.    Baseline Patient scored 15 on FOG at Eval.   Time 4   Period Weeks   Status New   Long Term Additional Goals   Additional Long Term Goals Yes   OT LONG TERM GOAL #6   Title Will complete BERG balance test next session and add a goal for balance.     Baseline patient with mulitple falls in the past 6 months, reports decreased balance with functional tasks.   Time 4   Period Weeks   Status New               Plan - 11/12/14 1620    Clinical Impression Statement Patient seen this date for initial treatment session of LSVT BIG protocol.  Patient and wife were instructed on LSVT BIG maximal daily exercises,  patient will continue to require additional instruction and assistance to become proficient with HEP.  Patient lost balance on 2 occsaions during exercises and required assistance for balance recovery.  CGA  for all exercises for safety.  Functional mobility with standard cane  requires cues for amplitude of gait, arm swing and posture.  Cues for head and neck placement during functional mobility.   Pt will benefit from skilled therapeutic intervention in order to improve on the following deficits (Retired) Abnormal gait;Decreased knowledge of use of DME;Decreased strength;Impaired flexibility;Decreased balance;Decreased mobility;Difficulty walking;Decreased range of motion;Pain;Decreased coordination;Impaired UE functional use   Rehab Potential Good   OT Frequency 4x / week   OT Duration 4  weeks   OT Treatment/Interventions Self-care/ADL training;Therapeutic exercise;Functional Mobility Training;Patient/family education;Neuromuscular education;Balance training;DME and/or AE instruction;Gait Training;Stair Training   OT Home Exercise Plan Will issue HEP by the end of the first week of therapy.   Consulted and Agree with Plan of Care Patient;Family member/caregiver        Problem List Patient Active Problem List   Diagnosis Date Noted  . Hypertension 10/11/2014  . Hyperlipidemia 10/11/2014  . Idiopathic Parkinson's disease 05/02/2014   Achilles Dunk, OTR/L, CLT Boehner,Athalene Kolle 11/13/2014, 9:28 AM  [place] MAIN [facility] SERVICES [address] [place], [place], [address] Phone: [phone]   Fax:  [phone]

## 2014-11-14 ENCOUNTER — Ambulatory Visit: Payer: PPO | Admitting: Occupational Therapy

## 2014-11-14 DIAGNOSIS — M6281 Muscle weakness (generalized): Secondary | ICD-10-CM

## 2014-11-14 DIAGNOSIS — R262 Difficulty in walking, not elsewhere classified: Secondary | ICD-10-CM

## 2014-11-14 DIAGNOSIS — R2681 Unsteadiness on feet: Secondary | ICD-10-CM

## 2014-11-14 DIAGNOSIS — R279 Unspecified lack of coordination: Secondary | ICD-10-CM

## 2014-11-14 DIAGNOSIS — Z741 Need for assistance with personal care: Secondary | ICD-10-CM

## 2014-11-14 DIAGNOSIS — R460 Very low level of personal hygiene: Secondary | ICD-10-CM

## 2014-11-14 NOTE — Therapy (Signed)
[place] MAIN [facility] SERVICES [address] [place], [place], [address] Phone: [phone]   Fax:  [phone]  Occupational Therapy Treatment  Patient Details  Name: Lawrence Valdez. MRN: [id number] Date of Birth: Feb 22, 1932 Referring Provider:  Roselee Nova, MD  Encounter Date: 11/13/2014      OT End of Session - 11/13/14 1636    Visit Number 3   Number of Visits 17   Date for OT Re-Evaluation 12/12/14   OT Start Time 1696   OT Stop Time 1510   OT Time Calculation (min) 65 min   Activity Tolerance Patient tolerated treatment well   Behavior During Therapy [facility] for tasks assessed/performed      Past Medical History  Diagnosis Date  . Hypertension   . Pacemaker   . Hypothyroidism   . Chronic kidney disease     enlarged prostrate  . Depression   . Dysrhythmia   . Arthritis   . Hypothyroid   . Bradycardia   . Vertigo     Past Surgical History  Procedure Laterality Date  . Insert / replace / remove pacemaker      dr Ubaldo Glassing  medtronic     09  . Cardiac catheterization      09  . Appendectomy    . Thumbs      trigger thumbs  . Cataract extraction    . Lumbar laminectomy/decompression microdiscectomy  08/06/2011    Procedure: LUMBAR LAMINECTOMY/DECOMPRESSION MICRODISCECTOMY 2 LEVELS;  Surgeon: Eustace Moore, MD;  Location: [place] [facility];  Service: Neurosurgery;  Laterality: N/A;  Lumbar two three, lumbar three four decompressive laminectomy  . Partial hip arthroplasty Right 2014  . Hernia repair  20 years ago    umbiluical   . Hernia repair Left 07-25-13    inguinal  . Shoulder surgery Right   . Knee surgery Left   . Joint replacement      lt knee  . Joint replacement      right hip    There were no vitals filed for this visit.  Visit Diagnosis:  Muscle weakness (generalized)  Lack of coordination  Difficulty walking  Self-care deficit for bathing and hygiene  Self-care deficit for dressing and  grooming  Unsteadiness      Subjective Assessment - 11/13/14 1635    Patient is accompained by: Family member   Patient Stated Goals "To get back to normal as possible"   Currently in Pain? Yes   Pain Score 2    Pain Location Hip   Pain Orientation Right   Pain Descriptors / Indicators Aching   Pain Type Chronic pain   Pain Frequency Constant   Multiple Pain Sites No                      OT Treatments/Exercises (OP) - 11/13/14 1636    Neurological Re-education Exercises   Other Exercises 1 LSVT Daily Session Maximal Daily Exercises: Sustained movements are designed to rescale the amplitude of movement output for generalization to daily functional activities. Performed as follows for 1 set of 10 repetitions each: Multi directional sustained movements- 1) Floor to ceiling, 2) Side to side. Multi directional Repetitive movements performed in standing and are designed to provide retraining effort needed for sustained muscle activation in tasks Performed as follows: 3) Step and reach forward, 4) Step and Reach Backwards, 5) Step and reach sideways, 6) Rock and reach forward/backward, 7) Rock and reach sideways.  Each exercise performed with CGA with occasional min assist for balance recovery. Sit to stand from mat table on lowest setting with cues for weight shift, technique and CGA for 5 reps for 2 sets. Patient seen for functional mobility tasks this date with emphasis on gait speed, length of steps with short distance ambulation, 200 feet for 4 trials using standard cane, cues for amplitude of gait, arm swing and weight shifting. Patient also requires cues for posture and head position during functional mobility. Patient requires cues for BIG movements and cadenceWall stretches with cues for shoulder and head position to help work on posture.                  OT Education - 11/13/14 1636    Education provided Yes   Education Details written/pictorial instruction this  date on maximal daily exercises   Person(s) Educated Patient;Spouse   Methods Explanation;Demonstration;Verbal cues   Comprehension Verbal cues required;Returned demonstration;Verbalized understanding             OT Long Term Goals - 11/05/14 1450    OT LONG TERM GOAL #1   Title Patient will improve gait speed and endurance and be able to walk 600 feet in 6 minutes to negotiate around the home and community safely    Baseline 485 feet at evaluation   Time 4   Period Weeks   Status New   OT LONG TERM GOAL #2   Title Patient will complete HEP for maximal daily exercises with modified independence    Baseline no current HEP   Time 4   Period Weeks   Status New   OT LONG TERM GOAL #3   Title Patient will transfer from sit to stand without the use of arms safely and independently from a variety of chairs/surfaces.   Baseline Moderate difficulty at evaluation and requires 47 secs to complete 5 times sit to stand.   Time 4   Period Weeks   Status New   OT LONG TERM GOAL #4   Title Patient will demo increased size of print with hand writing and legibility of greater than 80% to fill out important papers.   Baseline decreased legibility to 25% with scripted handwriting at evaluation, moderate micrographia.   Time 4   Period Weeks   Status New   OT LONG TERM GOAL #5   Title Patient will decrease freezing of gait behaviors on a daily basis and reduce score on Freezing of Gait Questionairre to 12 or less to decrease risk for falls.    Baseline Patient scored 15 on FOG at Eval.   Time 4   Period Weeks   Status New   Long Term Additional Goals   Additional Long Term Goals Yes   OT LONG TERM GOAL #6   Title Will complete BERG balance test next session and add a goal for balance.     Baseline patient with mulitple falls in the past 6 months, reports decreased balance with functional tasks.   Time 4   Period Weeks   Status New               Plan - 11/13/14 1637     Clinical Impression Statement Patient continues to require assist for balance during LSVT BIG exercises in the clinic, he will need to perform the modified/adapted version at home with his wife present.  He continues to require cues for posture with functional mobility and during maximal daily exercises.  Discussed functional component tasks  and patient needs to think more about the tasks he wants to add to his list.     Pt will benefit from skilled therapeutic intervention in order to improve on the following deficits (Retired) Abnormal gait;Decreased knowledge of use of DME;Decreased strength;Impaired flexibility;Decreased balance;Decreased mobility;Difficulty walking;Decreased range of motion;Pain;Decreased coordination;Impaired UE functional use   Rehab Potential Good   OT Frequency 4x / week   OT Duration 4 weeks   OT Treatment/Interventions Self-care/ADL training;Therapeutic exercise;Functional Mobility Training;Patient/family education;Neuromuscular education;Balance training;DME and/or AE instruction;Gait Training;Stair Training   OT Home Exercise Plan Will issue HEP by the end of the first week of therapy.   Consulted and Agree with Plan of Care Patient;Family member/caregiver        Problem List Patient Active Problem List   Diagnosis Date Noted  . Hypertension 10/11/2014  . Hyperlipidemia 10/11/2014  . Idiopathic Parkinson's disease 05/02/2014   Achilles Dunk, OTR/L, CLT Tollett,Amy 11/14/2014, 9:47 PM  [place] MAIN [facility] SERVICES [address] [place], [place], [address] Phone: [phone]   Fax:  [phone]

## 2014-11-15 ENCOUNTER — Ambulatory Visit: Payer: PPO | Admitting: Occupational Therapy

## 2014-11-15 ENCOUNTER — Encounter: Payer: Self-pay | Admitting: Occupational Therapy

## 2014-11-15 DIAGNOSIS — M6281 Muscle weakness (generalized): Secondary | ICD-10-CM

## 2014-11-15 DIAGNOSIS — R2681 Unsteadiness on feet: Secondary | ICD-10-CM

## 2014-11-15 DIAGNOSIS — R262 Difficulty in walking, not elsewhere classified: Secondary | ICD-10-CM

## 2014-11-15 DIAGNOSIS — R460 Very low level of personal hygiene: Secondary | ICD-10-CM

## 2014-11-15 DIAGNOSIS — R279 Unspecified lack of coordination: Secondary | ICD-10-CM

## 2014-11-15 DIAGNOSIS — Z741 Need for assistance with personal care: Secondary | ICD-10-CM

## 2014-11-15 NOTE — Therapy (Signed)
[place] MAIN [facility] SERVICES [address] [place], [place], [address] Phone: [phone]   Fax:  [phone]  Occupational Therapy Treatment  Patient Details  Name: Lawrence FEISTER Sr. MRN: [id number] Date of Birth: 22-Nov-1931 Referring Provider:  Anabel Bene, MD  Encounter Date: 11/15/2014      OT End of Session - 11/15/14 2041    Visit Number 5   Number of Visits 17   OT Start Time 0830   OT Stop Time 0940   OT Time Calculation (min) 70 min   Activity Tolerance Patient tolerated treatment well   Behavior During Therapy [facility] for tasks assessed/performed      Past Medical History  Diagnosis Date  . Hypertension   . Pacemaker   . Hypothyroidism   . Chronic kidney disease     enlarged prostrate  . Depression   . Dysrhythmia   . Arthritis   . Hypothyroid   . Bradycardia   . Vertigo     Past Surgical History  Procedure Laterality Date  . Insert / replace / remove pacemaker      dr Ubaldo Glassing  medtronic     09  . Cardiac catheterization      09  . Appendectomy    . Thumbs      trigger thumbs  . Cataract extraction    . Lumbar laminectomy/decompression microdiscectomy  08/06/2011    Procedure: LUMBAR LAMINECTOMY/DECOMPRESSION MICRODISCECTOMY 2 LEVELS;  Surgeon: Eustace Moore, MD;  Location: [place] [facility];  Service: Neurosurgery;  Laterality: N/A;  Lumbar two three, lumbar three four decompressive laminectomy  . Partial hip arthroplasty Right 2014  . Hernia repair  20 years ago    umbiluical   . Hernia repair Left 07-25-13    inguinal  . Shoulder surgery Right   . Knee surgery Left   . Joint replacement      lt knee  . Joint replacement      right hip    There were no vitals filed for this visit.  Visit Diagnosis:  Muscle weakness (generalized)  Lack of coordination  Difficulty walking  Self-care deficit for bathing and hygiene  Self-care deficit for dressing and grooming  Unsteadiness      Subjective  Assessment - 11/15/14 2039    Subjective  Patient reports he did not do the exercises last night, his wife went to bed early around 5 pm, he went to bed around 7 and needed help to do exercises, they promise they will perform tonight.   Patient is accompained by: Family member   Patient Stated Goals "To get back to normal as possible"   Currently in Pain? Yes   Pain Score 2    Pain Location Hip   Pain Orientation Right   Pain Descriptors / Indicators Aching   Pain Type Chronic pain   Pain Onset More than a month ago   Pain Frequency Constant   Multiple Pain Sites No                      OT Treatments/Exercises (OP) - 11/15/14 2049    ADLs   ADL Comments Patient has not yet decided on functional component tasks. Will need to decide by next session additional tasks to work on.   Neurological Re-education Exercises   Other Exercises 1 LSVT Daily Session Maximal Daily Exercises: Sustained movements are designed to rescale the amplitude of movement output for generalization to daily functional activities. Performed  as follows for 1 set of 10 repetitions each: Multi directional sustained movements- 1) Floor to ceiling, 2) Side to side. Multi directional Repetitive movements performed in standing and are designed to provide retraining effort needed for sustained muscle activation in tasks Performed as follows: 3) Step and reach forward, 4) Step and Reach Backwards, 5) Step and reach sideways, 6) Rock and reach forward/backward, 7) Rock and reach sideways. Each exercise performed with CGA with occasional min assist for balance recovery. Sit to stand from mat table on lowest setting with cues for weight shift, technique and CGA for 5 reps for 2 sets. Patient seen for functional mobility tasks this date with emphasis on gait speed, length of steps with short distance ambulation, 300 feet for 3 trials using standard cane, cues for amplitude of gait, arm swing and weight shifting. Patient also  requires cues for posture and head position during functional mobility. Patient requires cues for BIG movements and cadenceWall stretches with verbal and tactile cues for shoulder and head position to help work on posture.                 OT Education - 11/15/14 2041    Education provided Yes   Education Details adapted version of exercises to perform at home for HEP, using chair to assist with balance.   Person(s) Educated Patient   Methods Explanation;Demonstration;Verbal cues;Tactile cues   Comprehension Verbal cues required;Returned demonstration;Verbalized understanding;Tactile cues required             OT Long Term Goals - 11/05/14 1450    OT LONG TERM GOAL #1   Title Patient will improve gait speed and endurance and be able to walk 600 feet in 6 minutes to negotiate around the home and community safely    Baseline 485 feet at evaluation   Time 4   Period Weeks   Status New   OT LONG TERM GOAL #2   Title Patient will complete HEP for maximal daily exercises with modified independence    Baseline no current HEP   Time 4   Period Weeks   Status New   OT LONG TERM GOAL #3   Title Patient will transfer from sit to stand without the use of arms safely and independently from a variety of chairs/surfaces.   Baseline Moderate difficulty at evaluation and requires 47 secs to complete 5 times sit to stand.   Time 4   Period Weeks   Status New   OT LONG TERM GOAL #4   Title Patient will demo increased size of print with hand writing and legibility of greater than 80% to fill out important papers.   Baseline decreased legibility to 25% with scripted handwriting at evaluation, moderate micrographia.   Time 4   Period Weeks   Status New   OT LONG TERM GOAL #5   Title Patient will decrease freezing of gait behaviors on a daily basis and reduce score on Freezing of Gait Questionairre to 12 or less to decrease risk for falls.    Baseline Patient scored 15 on FOG at Eval.    Time 4   Period Weeks   Status New   Long Term Additional Goals   Additional Long Term Goals Yes   OT LONG TERM GOAL #6   Title Will complete BERG balance test next session and add a goal for balance.     Baseline patient with mulitple falls in the past 6 months, reports decreased balance with functional tasks.  Time 4   Period Weeks   Status New               Plan - 11/15/14 2042    Clinical Impression Statement Patient demonstrates progression with sit to stand, decreased assist this date and could perform with CGA and cues.Continues to require increased assist for balance with all exercises in standing due to decreased balance and reports some dizziness when he turns his head at times.  His amplitude of gait has improved with functional mobility and requires decreased cues in this area.  He still requires increased assist for maximal daily exercises and requires cues for sequence and technique.  He needs to be more consistent with performing exercises at home.     Pt will benefit from skilled therapeutic intervention in order to improve on the following deficits (Retired) Abnormal gait;Decreased knowledge of use of DME;Decreased strength;Impaired flexibility;Decreased balance;Decreased mobility;Difficulty walking;Decreased range of motion;Pain;Decreased coordination;Impaired UE functional use   Rehab Potential Good   OT Frequency 4x / week   OT Duration 4 weeks   OT Treatment/Interventions Self-care/ADL training;Therapeutic exercise;Functional Mobility Training;Patient/family education;Neuromuscular education;Balance training;DME and/or AE instruction;Gait Training;Stair Training   OT Home Exercise Plan Issued adapted version of exercises.    Consulted and Agree with Plan of Care Patient;Family member/caregiver   Family Member Consulted wife        Problem List Patient Active Problem List   Diagnosis Date Noted  . Hypertension 10/11/2014  . Hyperlipidemia 10/11/2014  .  Idiopathic Parkinson's disease 05/02/2014   Achilles Dunk, OTR/L, CLT  Moen,Amy 11/15/2014, 9:04 PM  [place] MAIN [facility] SERVICES [address] [place], [place], [address] Phone: [phone]   Fax:  [phone]

## 2014-11-15 NOTE — Therapy (Signed)
[place] MAIN [facility] SERVICES [address] [place], [place], [address] Phone: [phone]   Fax:  [phone]  Occupational Therapy Treatment  Patient Details  Name: Lawrence Valdez Sr. MRN: [id number] Date of Birth: 23-Sep-1931 Referring Provider:  Roselee Nova, MD  Encounter Date: 11/14/2014      OT End of Session - 11/14/14 1528    Visit Number 4   Number of Visits 17   Date for OT Re-Evaluation 12/12/14   OT Start Time 0827   OT Stop Time 0930   OT Time Calculation (min) 63 min   Activity Tolerance Patient tolerated treatment well   Behavior During Therapy [facility] for tasks assessed/performed      Past Medical History  Diagnosis Date  . Hypertension   . Pacemaker   . Hypothyroidism   . Chronic kidney disease     enlarged prostrate  . Depression   . Dysrhythmia   . Arthritis   . Hypothyroid   . Bradycardia   . Vertigo     Past Surgical History  Procedure Laterality Date  . Insert / replace / remove pacemaker      dr Ubaldo Glassing  medtronic     09  . Cardiac catheterization      09  . Appendectomy    . Thumbs      trigger thumbs  . Cataract extraction    . Lumbar laminectomy/decompression microdiscectomy  08/06/2011    Procedure: LUMBAR LAMINECTOMY/DECOMPRESSION MICRODISCECTOMY 2 LEVELS;  Surgeon: Eustace Moore, MD;  Location: [place] [facility]UR[facility] ORS;  Service: Neurosurgery;  Laterality: N/A;  Lumbar two three, lumbar three four decompressive laminectomy  . Partial hip arthroplasty Right 2014  . Hernia repair  20 years ago    umbiluical   . Hernia repair Left 07-25-13    inguinal  . Shoulder surgery Right   . Knee surgery Left   . Joint replacement      lt knee  . Joint replacement      right hip    There were no vitals filed for this visit.  Visit Diagnosis:  Muscle weakness (generalized)  Lack of coordination  Difficulty walking  Self-care deficit for bathing and hygiene  Self-care deficit for dressing and  grooming  Unsteadiness      Subjective Assessment - 11/14/14 1524    Subjective  Patient reports he did not do any exercises at home yet, did not feel comfortable doing them without help.   Patient is accompained by: Family member   Patient Stated Goals "To get back to normal as possible"   Currently in Pain? Yes   Pain Score 2    Pain Location Hip   Pain Orientation Right   Pain Descriptors / Indicators Aching   Pain Type Chronic pain   Pain Onset More than a month ago   Pain Frequency Constant   Multiple Pain Sites No                      OT Treatments/Exercises (OP) - 11/14/14 1526    Neurological Re-education Exercises   Other Exercises 1 LSVT Daily Session Maximal Daily Exercises: Sustained movements are designed to rescale the amplitude of movement output for generalization to daily functional activities. Performed as follows for 1 set of 10 repetitions each: Multi directional sustained movements- 1) Floor to ceiling, 2) Side to side. Multi directional Repetitive movements performed in standing and are designed to provide retraining effort needed for sustained  muscle activation in tasks Performed as follows: 3) Step and reach forward, 4) Step and Reach Backwards, 5) Step and reach sideways, 6) Rock and reach forward/backward, 7) Rock and reach sideways. Each exercise performed with CGA with occasional min assist for balance recovery. Sit to stand from mat table on lowest setting with cues for weight shift, technique and CGA for 5 reps for 2 sets. Patient seen for functional mobility tasks this date with emphasis on gait speed, length of steps with short distance ambulation, 300 feet for 3 trials using standard cane, cues for amplitude of gait, arm swing and weight shifting. Patient also requires cues for posture and head position during functional mobility. Patient requires cues for BIG movements and cadenceWall stretches with verbal and tactile cues for shoulder and  head position to help work on posture.                 OT Education - 11/14/14 1527    Education provided Yes   Education Details HEP, LSVT BIG principles.   Person(s) Educated Patient;Spouse   Methods Explanation;Demonstration;Verbal cues   Comprehension Verbalized understanding;Returned demonstration;Verbal cues required             OT Long Term Goals - 11/05/14 1450    OT LONG TERM GOAL #1   Title Patient will improve gait speed and endurance and be able to walk 600 feet in 6 minutes to negotiate around the home and community safely    Baseline 485 feet at evaluation   Time 4   Period Weeks   Status New   OT LONG TERM GOAL #2   Title Patient will complete HEP for maximal daily exercises with modified independence    Baseline no current HEP   Time 4   Period Weeks   Status New   OT LONG TERM GOAL #3   Title Patient will transfer from sit to stand without the use of arms safely and independently from a variety of chairs/surfaces.   Baseline Moderate difficulty at evaluation and requires 47 secs to complete 5 times sit to stand.   Time 4   Period Weeks   Status New   OT LONG TERM GOAL #4   Title Patient will demo increased size of print with hand writing and legibility of greater than 80% to fill out important papers.   Baseline decreased legibility to 25% with scripted handwriting at evaluation, moderate micrographia.   Time 4   Period Weeks   Status New   OT LONG TERM GOAL #5   Title Patient will decrease freezing of gait behaviors on a daily basis and reduce score on Freezing of Gait Questionairre to 12 or less to decrease risk for falls.    Baseline Patient scored 15 on FOG at Eval.   Time 4   Period Weeks   Status New   Long Term Additional Goals   Additional Long Term Goals Yes   OT LONG TERM GOAL #6   Title Will complete BERG balance test next session and add a goal for balance.     Baseline patient with mulitple falls in the past 6 months,  reports decreased balance with functional tasks.   Time 4   Period Weeks   Status New               Plan - 11/14/14 1546    Clinical Impression Statement Patient continues to demo decreased balance with acts in standing and requires CGA to minimal assist.  He was  issued modified exercises to perform at home but did not do them last night.  Encouraged patient to perform HEP since this is critical to his outcomes and consistency.  He responds well to cues for increasing amplitude of gait but when he gets fatigued his steps shorten and he has to sit and rest.        Problem List Patient Active Problem List   Diagnosis Date Noted  . Hypertension 10/11/2014  . Hyperlipidemia 10/11/2014  . Idiopathic Parkinson's disease 05/02/2014   Achilles Dunk, OTR/L, CLT  Mcquarrie,Hyland Mollenkopf 11/15/2014, 3:48 PM   [facility] MAIN [facility] SERVICES [address] [place], [place], [address] Phone: [phone]   Fax:  [phone]

## 2014-11-19 ENCOUNTER — Ambulatory Visit: Payer: PPO | Admitting: Occupational Therapy

## 2014-11-19 DIAGNOSIS — R262 Difficulty in walking, not elsewhere classified: Secondary | ICD-10-CM

## 2014-11-19 DIAGNOSIS — R460 Very low level of personal hygiene: Secondary | ICD-10-CM

## 2014-11-19 DIAGNOSIS — R279 Unspecified lack of coordination: Secondary | ICD-10-CM

## 2014-11-19 DIAGNOSIS — Z741 Need for assistance with personal care: Secondary | ICD-10-CM

## 2014-11-19 DIAGNOSIS — M6281 Muscle weakness (generalized): Secondary | ICD-10-CM | POA: Diagnosis not present

## 2014-11-20 ENCOUNTER — Ambulatory Visit: Payer: PPO | Admitting: Occupational Therapy

## 2014-11-20 DIAGNOSIS — M6281 Muscle weakness (generalized): Secondary | ICD-10-CM | POA: Diagnosis not present

## 2014-11-20 DIAGNOSIS — Z741 Need for assistance with personal care: Secondary | ICD-10-CM

## 2014-11-20 DIAGNOSIS — R460 Very low level of personal hygiene: Secondary | ICD-10-CM

## 2014-11-20 DIAGNOSIS — R2681 Unsteadiness on feet: Secondary | ICD-10-CM

## 2014-11-20 DIAGNOSIS — R279 Unspecified lack of coordination: Secondary | ICD-10-CM

## 2014-11-20 DIAGNOSIS — R262 Difficulty in walking, not elsewhere classified: Secondary | ICD-10-CM

## 2014-11-20 NOTE — Therapy (Signed)
[place] MAIN [facility] SERVICES [address] [place], [place], [address] Phone: [phone]   Fax:  [phone]  Occupational Therapy Treatment  Patient Details  Name: Lawrence GULLA Sr. MRN: [id number] Date of Birth: [birth year] Referring Provider:  Anabel Bene, MD  Encounter Date: 11/19/2014      OT End of Session - 11/19/14 1533    Visit Number 6   Number of Visits 17   Date for OT Re-Evaluation 12/12/14   OT Start Time 0830   OT Stop Time 0932   OT Time Calculation (min) 62 min   Activity Tolerance Patient tolerated treatment well   Behavior During Therapy [facility] for tasks assessed/performed      Past Medical History  Diagnosis Date  . Hypertension   . Pacemaker   . Hypothyroidism   . Chronic kidney disease     enlarged prostrate  . Depression   . Dysrhythmia   . Arthritis   . Hypothyroid   . Bradycardia   . Vertigo     Past Surgical History  Procedure Laterality Date  . Insert / replace / remove pacemaker      dr Ubaldo Glassing  medtronic     09  . Cardiac catheterization      09  . Appendectomy    . Thumbs      trigger thumbs  . Cataract extraction    . Lumbar laminectomy/decompression microdiscectomy  08/06/2011    Procedure: LUMBAR LAMINECTOMY/DECOMPRESSION MICRODISCECTOMY 2 LEVELS;  Surgeon: Eustace Moore, MD;  Location: [place] NEURO ORS;  Service: Neurosurgery;  Laterality: N/A;  Lumbar two three, lumbar three four decompressive laminectomy  . Partial hip arthroplasty Right 2014  . Hernia repair  20 years ago    umbiluical   . Hernia repair Left 07-25-13    inguinal  . Shoulder surgery Right   . Knee surgery Left   . Joint replacement      lt knee  . Joint replacement      right hip    There were no vitals filed for this visit.  Visit Diagnosis:  Muscle weakness (generalized)  Lack of coordination  Difficulty walking  Self-care deficit for bathing and hygiene  Self-care deficit for dressing and  grooming      Subjective Assessment - 11/19/14 1530    Subjective  Patient reports he did some of the exercises over the weekend but could not do all of them because of pain in his right hip.  "I think I need another cortisone shot."  Reports he did do some walking over the weekend.  Patient started with his exercises this date with a reported pain level of 5 in his right hip but was reduced down to a 3 after performing exercises.    Patient is accompained by: Family member   Patient Stated Goals "To get back to normal as possible"   Currently in Pain? Yes   Pain Score 5   5/10 in hip at beginning of session, 3/10 at end.   Pain Location Hip   Pain Orientation Right   Pain Descriptors / Indicators Aching   Pain Type Chronic pain   Pain Onset More than a month ago   Pain Frequency Constant   Multiple Pain Sites No                      OT Treatments/Exercises (OP) - 11/19/14 1537    Neurological Re-education Exercises   Other Exercises 1  LSVT Daily Session Maximal Daily Exercises: Sustained movements are designed to rescale the amplitude of movement output for generalization to daily functional activities. Performed as follows for 1 set of 10 repetitions each: Multi directional sustained movements- 1) Floor to ceiling, 2) Side to side. Multi directional Repetitive movements performed in standing and are designed to provide retraining effort needed for sustained muscle activation in tasks Performed as follows: 3) Step and reach forward, 4) Step and Reach Backwards, 5) Step and reach sideways, 6) Rock and reach forward/backward, 7) Rock and reach sideways. Each exercise performed with CGA with occasional min assist for balance recovery. Sit to stand from mat table on lowest setting with cues for weight shift, technique and CGA for 5 reps for 2 sets. Patient seen for functional mobility tasks this date with emphasis on gait speed, length of steps with short distance ambulation, 300  feet for 3 trials using standard cane, cues for amplitude of gait, arm swing and weight shifting. Patient also requires cues for posture and head position during functional mobility. Patient requires cues for BIG movements and cadenceWall stretches with verbal and tactile cues for shoulder and head position to help work on posture.  Patient had difficulty with backwards step this date and requires additional instruction and cues to correct technique.                 OT Education - 11/19/14 1533    Education provided Yes   Education Details HEP, adapted exercises with use of chair   Person(s) Educated Patient;Spouse   Methods Explanation;Demonstration;Verbal cues   Comprehension Verbal cues required;Returned demonstration;Verbalized understanding             OT Long Term Goals - 11/05/14 1450    OT LONG TERM GOAL #1   Title Patient will improve gait speed and endurance and be able to walk 600 feet in 6 minutes to negotiate around the home and community safely    Baseline 485 feet at evaluation   Time 4   Period Weeks   Status New   OT LONG TERM GOAL #2   Title Patient will complete HEP for maximal daily exercises with modified independence    Baseline no current HEP   Time 4   Period Weeks   Status New   OT LONG TERM GOAL #3   Title Patient will transfer from sit to stand without the use of arms safely and independently from a variety of chairs/surfaces.   Baseline Moderate difficulty at evaluation and requires 47 secs to complete 5 times sit to stand.   Time 4   Period Weeks   Status New   OT LONG TERM GOAL #4   Title Patient will demo increased size of print with hand writing and legibility of greater than 80% to fill out important papers.   Baseline decreased legibility to 25% with scripted handwriting at evaluation, moderate micrographia.   Time 4   Period Weeks   Status New   OT LONG TERM GOAL #5   Title Patient will decrease freezing of gait behaviors on a  daily basis and reduce score on Freezing of Gait Questionairre to 12 or less to decrease risk for falls.    Baseline Patient scored 15 on FOG at Eval.   Time 4   Period Weeks   Status New   Long Term Additional Goals   Additional Long Term Goals Yes   OT LONG TERM GOAL #6   Title Will complete BERG balance test  next session and add a goal for balance.     Baseline patient with mulitple falls in the past 6 months, reports decreased balance with functional tasks.   Time 4   Period Weeks   Status New               Plan - 11/19/14 1534    Clinical Impression Statement Patient continues to complain of hip pain on the right side which has limited his participation in exercises at home however, he reports decreased pain after performing exercises this date.  Discussed the importance of performing these exercises daily 2 times a day during the intensive portion of therapy to see results.  Patient does require CGA to minimal assist for balance for exercises in standing as well as continual cues for posture and head position during all tasks.    Pt will benefit from skilled therapeutic intervention in order to improve on the following deficits (Retired) Abnormal gait;Decreased knowledge of use of DME;Decreased strength;Impaired flexibility;Decreased balance;Decreased mobility;Difficulty walking;Decreased range of motion;Pain;Decreased coordination;Impaired UE functional use   Rehab Potential Good   OT Frequency 4x / week   OT Duration 4 weeks   OT Treatment/Interventions Self-care/ADL training;Therapeutic exercise;Functional Mobility Training;Patient/family education;Neuromuscular education;Balance training;DME and/or AE instruction;Gait Training;Stair Training   OT Home Exercise Plan Issued adapted version of exercises.    Consulted and Agree with Plan of Care Patient;Family member/caregiver   Family Member Consulted wife        Problem List Patient Active Problem List   Diagnosis Date  Noted  . Hypertension 10/11/2014  . Hyperlipidemia 10/11/2014  . Idiopathic Parkinson's disease 05/02/2014   Achilles Dunk, OTR/L, CLT Darcey,Keanon Bevins 11/20/2014, 10:25 AM  [place] MAIN [facility] SERVICES [address] [place], [place], [address] Phone: [phone]   Fax:  [phone]

## 2014-11-21 ENCOUNTER — Ambulatory Visit: Payer: PPO | Admitting: Occupational Therapy

## 2014-11-21 DIAGNOSIS — R2681 Unsteadiness on feet: Secondary | ICD-10-CM

## 2014-11-21 DIAGNOSIS — R279 Unspecified lack of coordination: Secondary | ICD-10-CM

## 2014-11-21 DIAGNOSIS — R460 Very low level of personal hygiene: Secondary | ICD-10-CM

## 2014-11-21 DIAGNOSIS — Z741 Need for assistance with personal care: Secondary | ICD-10-CM

## 2014-11-21 DIAGNOSIS — R262 Difficulty in walking, not elsewhere classified: Secondary | ICD-10-CM

## 2014-11-21 DIAGNOSIS — M6281 Muscle weakness (generalized): Secondary | ICD-10-CM

## 2014-11-21 NOTE — Therapy (Signed)
[place] MAIN [facility] SERVICES [address] [place], [place], [address] Phone: [phone]   Fax:  [phone]  Occupational Therapy Treatment  Patient Details  Name: Lawrence Valdez Sr. MRN: [id number] Date of Birth: Aug 24, 1931 Referring Provider:  Anabel Bene, MD  Encounter Date: 11/20/2014      OT End of Session - 11/21/14 1021    Visit Number 7   Number of Visits 17   Date for OT Re-Evaluation 12/12/14   OT Start Time 0825   OT Stop Time 0930   OT Time Calculation (min) 65 min   Activity Tolerance Patient tolerated treatment well   Behavior During Therapy [facility] for tasks assessed/performed      Past Medical History  Diagnosis Date  . Hypertension   . Pacemaker   . Hypothyroidism   . Chronic kidney disease     enlarged prostrate  . Depression   . Dysrhythmia   . Arthritis   . Hypothyroid   . Bradycardia   . Vertigo     Past Surgical History  Procedure Laterality Date  . Insert / replace / remove pacemaker      dr Ubaldo Glassing  medtronic     09  . Cardiac catheterization      09  . Appendectomy    . Thumbs      trigger thumbs  . Cataract extraction    . Lumbar laminectomy/decompression microdiscectomy  08/06/2011    Procedure: LUMBAR LAMINECTOMY/DECOMPRESSION MICRODISCECTOMY 2 LEVELS;  Surgeon: Eustace Moore, MD;  Location: [place] NEURO ORS;  Service: Neurosurgery;  Laterality: N/A;  Lumbar two three, lumbar three four decompressive laminectomy  . Partial hip arthroplasty Right 2014  . Hernia repair  20 years ago    umbiluical   . Hernia repair Left 07-25-13    inguinal  . Shoulder surgery Right   . Knee surgery Left   . Joint replacement      lt knee  . Joint replacement      right hip    There were no vitals filed for this visit.  Visit Diagnosis:  Muscle weakness (generalized)  Lack of coordination  Difficulty walking  Self-care deficit for bathing and hygiene  Self-care deficit for dressing and  grooming  Unsteadiness      Subjective Assessment - 11/21/14 1016    Subjective  Patient reports he is still trying to do exercises at home with his wife.  Mild hip pain this date, 1/10 pain.  Reports their TV broke and need to get another one after therapy today.  "Last night was a quiet night without any TV."   Patient is accompained by: Family member   Patient Stated Goals "To get back to normal as possible"   Currently in Pain? Yes   Pain Score 1    Pain Location Hip   Pain Orientation Right   Pain Type Chronic pain   Pain Onset More than a month ago   Pain Frequency Constant   Multiple Pain Sites No                      OT Treatments/Exercises (OP) - 11/20/14 1018    ADLs   ADL Comments finger flicks to assist with coordination and handwriting tasks.   Neurological Re-education Exercises   Other Exercises 1 LSVT Daily Session Maximal Daily Exercises: Sustained movements are designed to rescale the amplitude of movement output for generalization to daily functional activities. Performed as follows for  1 set of 10 repetitions each: Multi directional sustained movements- 1) Floor to ceiling, 2) Side to side. Multi directional Repetitive movements performed in standing and are designed to provide retraining effort needed for sustained muscle activation in tasks Performed as follows: 3) Step and reach forward, 4) Step and Reach Backwards, 5) Step and reach sideways, 6) Rock and reach forward/backward, 7) Rock and reach sideways. Each exercise performed with CGA with occasional min assist for balance recovery. Sit to stand from mat table on lowest setting with cues for weight shift, technique and CGA for 5 reps for 2 sets. Patient seen for functional mobility tasks this date with emphasis on gait speed, length of steps with short distance ambulation, 350 feet for 3 trials using standard cane, cues for amplitude of gait, arm swing and weight shifting. Patient also requires cues  for posture and head position during functional mobility. Patient requires cues for BIG movements and cadenceWall stretches with verbal and tactile cues for shoulder and head position to help work on posture.                 OT Education - 11/21/14 1020    Education provided Yes   Education Details HEP, adapted exercises, postural stretches.   Person(s) Educated Patient;Spouse   Methods Explanation;Demonstration;Verbal cues   Comprehension Verbal cues required;Returned demonstration;Verbalized understanding             OT Long Term Goals - 11/05/14 1450    OT LONG TERM GOAL #1   Title Patient will improve gait speed and endurance and be able to walk 600 feet in 6 minutes to negotiate around the home and community safely    Baseline 485 feet at evaluation   Time 4   Period Weeks   Status New   OT LONG TERM GOAL #2   Title Patient will complete HEP for maximal daily exercises with modified independence    Baseline no current HEP   Time 4   Period Weeks   Status New   OT LONG TERM GOAL #3   Title Patient will transfer from sit to stand without the use of arms safely and independently from a variety of chairs/surfaces.   Baseline Moderate difficulty at evaluation and requires 47 secs to complete 5 times sit to stand.   Time 4   Period Weeks   Status New   OT LONG TERM GOAL #4   Title Patient will demo increased size of print with hand writing and legibility of greater than 80% to fill out important papers.   Baseline decreased legibility to 25% with scripted handwriting at evaluation, moderate micrographia.   Time 4   Period Weeks   Status New   OT LONG TERM GOAL #5   Title Patient will decrease freezing of gait behaviors on a daily basis and reduce score on Freezing of Gait Questionairre to 12 or less to decrease risk for falls.    Baseline Patient scored 15 on FOG at Eval.   Time 4   Period Weeks   Status New   Long Term Additional Goals   Additional Long  Term Goals Yes   OT LONG TERM GOAL #6   Title Will complete BERG balance test next session and add a goal for balance.     Baseline patient with mulitple falls in the past 6 months, reports decreased balance with functional tasks.   Time 4   Period Weeks   Status New  Plan - 11/20/14 1021    Clinical Impression Statement Patient with decreased pain in right hip this date, 1/10 pain.  Continues to require CGA to min assist for maximal daily exercises with decreased balance at times.  Increased cues for backwards step.  Step length during exercises remains slightly shortened but responds to cues and requires occasional rest breaks.  Increased distance with functional mobility with implementing BIG movements.   Pt will benefit from skilled therapeutic intervention in order to improve on the following deficits (Retired) Abnormal gait;Decreased knowledge of use of DME;Decreased strength;Impaired flexibility;Decreased balance;Decreased mobility;Difficulty walking;Decreased range of motion;Pain;Decreased coordination;Impaired UE functional use   Rehab Potential Good   OT Frequency 4x / week   OT Duration 4 weeks   OT Treatment/Interventions Self-care/ADL training;Therapeutic exercise;Functional Mobility Training;Patient/family education;Neuromuscular education;Balance training;DME and/or AE instruction;Gait Training;Stair Training   OT Home Exercise Plan Issued adapted version of exercises.         Problem List Patient Active Problem List   Diagnosis Date Noted  . Hypertension 10/11/2014  . Hyperlipidemia 10/11/2014  . Idiopathic Parkinson's disease 05/02/2014   Amy T Tomasita Morrow, OTR/L, CLT [place]ebeck,Amy 11/21/2014, 10:28 AM  [place] MAIN [facility] SERVICES [address] [place], [place], [address] Phone: [phone]   Fax:  [phone]

## 2014-11-22 ENCOUNTER — Ambulatory Visit: Payer: PPO | Attending: Neurology | Admitting: Occupational Therapy

## 2014-11-22 ENCOUNTER — Telehealth: Payer: Self-pay | Admitting: Family Medicine

## 2014-11-22 DIAGNOSIS — R279 Unspecified lack of coordination: Secondary | ICD-10-CM | POA: Diagnosis present

## 2014-11-22 DIAGNOSIS — R2681 Unsteadiness on feet: Secondary | ICD-10-CM | POA: Insufficient documentation

## 2014-11-22 DIAGNOSIS — M6281 Muscle weakness (generalized): Secondary | ICD-10-CM | POA: Insufficient documentation

## 2014-11-22 DIAGNOSIS — Z741 Need for assistance with personal care: Secondary | ICD-10-CM

## 2014-11-22 DIAGNOSIS — R460 Very low level of personal hygiene: Secondary | ICD-10-CM | POA: Insufficient documentation

## 2014-11-22 DIAGNOSIS — R262 Difficulty in walking, not elsewhere classified: Secondary | ICD-10-CM | POA: Diagnosis present

## 2014-11-22 MED ORDER — LORATADINE 10 MG PO TABS: 10.0000 mg | ORAL_TABLET | Freq: Every day | ORAL | Status: DC

## 2014-11-22 NOTE — Telephone Encounter (Signed)
Medication has been refilled and sent to [facility]

## 2014-11-22 NOTE — Therapy (Signed)
[place] MAIN [facility] SERVICES [address] [place], [place], [address] Phone: [phone]   Fax:  [phone]  Occupational Therapy Treatment  Patient Details  Name: Lawrence COLINA Sr. MRN: [id number] Date of Birth: [birth year] Referring Provider:  Anabel Bene, MD  Encounter Date: 11/21/2014      OT End of Session - 11/21/14 1525    Visit Number 8   Number of Visits 17   Date for OT Re-Evaluation 12/12/14   OT Start Time 0828   OT Stop Time 0930   OT Time Calculation (min) 62 min   Activity Tolerance Patient tolerated treatment well   Behavior During Therapy [facility] for tasks assessed/performed      Past Medical History  Diagnosis Date  . Hypertension   . Pacemaker   . Hypothyroidism   . Chronic kidney disease     enlarged prostrate  . Depression   . Dysrhythmia   . Arthritis   . Hypothyroid   . Bradycardia   . Vertigo     Past Surgical History  Procedure Laterality Date  . Insert / replace / remove pacemaker      dr Ubaldo Glassing  medtronic     09  . Cardiac catheterization      09  . Appendectomy    . Thumbs      trigger thumbs  . Cataract extraction    . Lumbar laminectomy/decompression microdiscectomy  08/06/2011    Procedure: LUMBAR LAMINECTOMY/DECOMPRESSION MICRODISCECTOMY 2 LEVELS;  Surgeon: Eustace Moore, MD;  Location: [place] [facility];  Service: Neurosurgery;  Laterality: N/A;  Lumbar two three, lumbar three four decompressive laminectomy  . Partial hip arthroplasty Right 2014  . Hernia repair  20 years ago    umbiluical   . Hernia repair Left 07-25-13    inguinal  . Shoulder surgery Right   . Knee surgery Left   . Joint replacement      lt knee  . Joint replacement      right hip    There were no vitals filed for this visit.  Visit Diagnosis:  Muscle weakness (generalized)  Lack of coordination  Difficulty walking  Self-care deficit for bathing and hygiene  Self-care deficit for dressing and  grooming  Unsteadiness      Subjective Assessment - 11/21/14 1523    Subjective  Patient reports he didn't sleep well last night and his balance has seemed off since he has been up.  They had to buy a new TV yesterday and having it delivered after they leave here today.     Patient is accompained by: Family member   Patient Stated Goals "To get back to normal as possible"   Currently in Pain? Yes   Pain Score 1    Pain Location Hip   Pain Orientation Right   Pain Descriptors / Indicators Aching   Pain Type Chronic pain   Pain Onset More than a month ago   Pain Frequency Constant   Multiple Pain Sites No                      OT Treatments/Exercises (OP) - 11/21/14 2129    ADLs   ADL Comments Handwriting tasks with formation of larger scale letters, finger flicks.   Neurological Re-education Exercises   Other Exercises 1 LSVT Daily Session Maximal Daily Exercises: Sustained movements are designed to rescale the amplitude of movement output for generalization to daily functional activities. Performed as follows  for 1 set of 10 repetitions each: Multi directional sustained movements- 1) Floor to ceiling, 2) Side to side. Multi directional Repetitive movements performed in standing and are designed to provide retraining effort needed for sustained muscle activation in tasks Performed as follows: 3) Step and reach forward, 4) Step and Reach Backwards, 5) Step and reach sideways, 6) Rock and reach forward/backward, 7) Rock and reach sideways. Each exercise performed with CGA with occasional min assist for balance recovery. Sit to stand from mat table on lowest setting with cues for weight shift, technique and CGA for 5 reps for 2 sets. Patient seen for functional mobility tasks this date with emphasis on gait speed, length of steps with short distance ambulation, 200 feet for 3 trials using standard cane, cues for amplitude of gait, arm swing and weight shifting. Patient also  requires cues for posture and head position during functional mobility. Patient requires cues for BIG movements and cadenceWall stretches with verbal and tactile cues for shoulder and head position to help work on posture.                 OT Education - 11/21/14 1525    Education provided Yes   Education Details Balance, exercises, posture   Person(s) Educated Patient;Spouse   Methods Explanation;Demonstration;Verbal cues   Comprehension Verbal cues required;Verbalized understanding;Returned demonstration             OT Long Term Goals - 11/05/14 1450    OT LONG TERM GOAL #1   Title Patient will improve gait speed and endurance and be able to walk 600 feet in 6 minutes to negotiate around the home and community safely    Baseline 485 feet at evaluation   Time 4   Period Weeks   Status New   OT LONG TERM GOAL #2   Title Patient will complete HEP for maximal daily exercises with modified independence    Baseline no current HEP   Time 4   Period Weeks   Status New   OT LONG TERM GOAL #3   Title Patient will transfer from sit to stand without the use of arms safely and independently from a variety of chairs/surfaces.   Baseline Moderate difficulty at evaluation and requires 47 secs to complete 5 times sit to stand.   Time 4   Period Weeks   Status New   OT LONG TERM GOAL #4   Title Patient will demo increased size of print with hand writing and legibility of greater than 80% to fill out important papers.   Baseline decreased legibility to 25% with scripted handwriting at evaluation, moderate micrographia.   Time 4   Period Weeks   Status New   OT LONG TERM GOAL #5   Title Patient will decrease freezing of gait behaviors on a daily basis and reduce score on Freezing of Gait Questionairre to 12 or less to decrease risk for falls.    Baseline Patient scored 15 on FOG at Eval.   Time 4   Period Weeks   Status New   Long Term Additional Goals   Additional Long  Term Goals Yes   OT LONG TERM GOAL #6   Title Will complete BERG balance test next session and add a goal for balance.     Baseline patient with mulitple falls in the past 6 months, reports decreased balance with functional tasks.   Time 4   Period Weeks   Status New  Plan - 11/21/14 1526    Clinical Impression Statement Patient continues to report decreased pain in the hip over the last couple of days.  Balance was impaired this date and patient required increased assistance with all exercises with several episodes of loss of balance with assist for recovery.  Pt feels this may be due to lack of sleep last night.  Required increased rest breaks with functional mobility this date, when fatigued he tends to revert back to shortened step length.     Rehab Potential Good   OT Frequency 4x / week   OT Duration 4 weeks   OT Treatment/Interventions Self-care/ADL training;Therapeutic exercise;Functional Mobility Training;Patient/family education;Neuromuscular education;Balance training;DME and/or AE instruction;Gait Training;Stair Training   OT Home Exercise Plan Issued adapted version of exercises.    Consulted and Agree with Plan of Care Patient;Family member/caregiver   Family Member Consulted wife        Problem List Patient Active Problem List   Diagnosis Date Noted  . Hypertension 10/11/2014  . Hyperlipidemia 10/11/2014  . Idiopathic Parkinson's disease 05/02/2014   Achilles Dunk, OTR/L, CLT [place],Amy 11/22/2014, 9:30 PM  [place] MAIN [facility] SERVICES [address] [place], [place], [address] Phone: [phone]   Fax:  [phone]

## 2014-11-22 NOTE — Telephone Encounter (Signed)
Requesting refill on Loratidine. Please send to [place] seen 08-13-14

## 2014-11-23 NOTE — Therapy (Signed)
[place] MAIN [facility] SERVICES [address] [place], [place], [address] Phone: [phone]   Fax:  [phone]  Occupational Therapy Treatment  Patient Details  Name: Lawrence WOLD Sr. MRN: [id number] Date of Birth: [birth year] Referring Provider:  Anabel Bene, MD  Encounter Date: 11/22/2014      OT End of Session - 11/22/14 2204    Visit Number 9   Number of Visits 17   Date for OT Re-Evaluation 12/12/14   OT Start Time 0830   OT Stop Time 0930   OT Time Calculation (min) 60 min   Activity Tolerance Patient tolerated treatment well   Behavior During Therapy [facility] for tasks assessed/performed      Past Medical History  Diagnosis Date  . Hypertension   . Pacemaker   . Hypothyroidism   . Chronic kidney disease     enlarged prostrate  . Depression   . Dysrhythmia   . Arthritis   . Hypothyroid   . Bradycardia   . Vertigo     Past Surgical History  Procedure Laterality Date  . Insert / replace / remove pacemaker      dr Ubaldo Glassing  medtronic     09  . Cardiac catheterization      09  . Appendectomy    . Thumbs      trigger thumbs  . Cataract extraction    . Lumbar laminectomy/decompression microdiscectomy  08/06/2011    Procedure: LUMBAR LAMINECTOMY/DECOMPRESSION MICRODISCECTOMY 2 LEVELS;  Surgeon: Eustace Moore, MD;  Location: [place] [facility];  Service: Neurosurgery;  Laterality: N/A;  Lumbar two three, lumbar three four decompressive laminectomy  . Partial hip arthroplasty Right 2014  . Hernia repair  20 years ago    umbiluical   . Hernia repair Left 07-25-13    inguinal  . Shoulder surgery Right   . Knee surgery Left   . Joint replacement      lt knee  . Joint replacement      right hip    There were no vitals filed for this visit.  Visit Diagnosis:  Muscle weakness (generalized)  Lack of coordination  Difficulty walking  Self-care deficit for bathing and hygiene  Self-care deficit for dressing and  grooming  Unsteadiness      Subjective Assessment - 11/22/14 2207    Patient is accompained by: Family member   Patient Stated Goals "To get back to normal as possible"   Currently in Pain? Yes   Pain Score 1    Pain Location Hip   Pain Orientation Right   Pain Descriptors / Indicators Aching   Pain Type Chronic pain   Pain Onset More than a month ago   Pain Frequency Constant   Multiple Pain Sites No                      OT Treatments/Exercises (OP) - 11/22/14 2203    ADLs   ADL Comments Seen for Functional component tasks as follows:  sit to stand, handwriting tasks, turning in small spaces, negotiation of stairs, rolling in bed.  Focused this date on large scale letter formation with handwriting.  Several trials and then production of printed and scripted name with good results, increased size of letters and legibility.   Neurological Re-education Exercises   Other Exercises 1 LSVT Daily Session Maximal Daily Exercises: Sustained movements are designed to rescale the amplitude of movement output for generalization to daily functional activities. Performed as  follows for 1 set of 10 repetitions each: Multi directional sustained movements- 1) Floor to ceiling, 2) Side to side. Multi directional Repetitive movements performed in standing and are designed to provide retraining effort needed for sustained muscle activation in tasks Performed as follows: 3) Step and reach forward, 4) Step and Reach Backwards, 5) Step and reach sideways, 6) Rock and reach forward/backward, 7) Rock and reach sideways. Each exercise performed with CGA with occasional min assist for balance recovery. Sit to stand from mat table on lowest setting with cues for weight shift, technique and CGA for 5 reps for 2 sets. Patient seen for functional mobility tasks this date with emphasis on gait speed, length of steps with short distance ambulation, 350 feet for 2 trials using standard cane, cues for amplitude  of gait, arm swing and weight shifting. Patient also requires cues for posture and head position during functional mobility. Patient requires cues for BIG movements and cadence                     OT Long Term Goals - 11/05/14 1450    OT LONG TERM GOAL #1   Title Patient will improve gait speed and endurance and be able to walk 600 feet in 6 minutes to negotiate around the home and community safely    Baseline 485 feet at evaluation   Time 4   Period Weeks   Status New   OT LONG TERM GOAL #2   Title Patient will complete HEP for maximal daily exercises with modified independence    Baseline no current HEP   Time 4   Period Weeks   Status New   OT LONG TERM GOAL #3   Title Patient will transfer from sit to stand without the use of arms safely and independently from a variety of chairs/surfaces.   Baseline Moderate difficulty at evaluation and requires 47 secs to complete 5 times sit to stand.   Time 4   Period Weeks   Status New   OT LONG TERM GOAL #4   Title Patient will demo increased size of print with hand writing and legibility of greater than 80% to fill out important papers.   Baseline decreased legibility to 25% with scripted handwriting at evaluation, moderate micrographia.   Time 4   Period Weeks   Status New   OT LONG TERM GOAL #5   Title Patient will decrease freezing of gait behaviors on a daily basis and reduce score on Freezing of Gait Questionairre to 12 or less to decrease risk for falls.    Baseline Patient scored 15 on FOG at Eval.   Time 4   Period Weeks   Status New   Long Term Additional Goals   Additional Long Term Goals Yes   OT LONG TERM GOAL #6   Title Will complete BERG balance test next session and add a goal for balance.     Baseline patient with mulitple falls in the past 6 months, reports decreased balance with functional tasks.   Time 4   Period Weeks   Status New               Plan - 11/22/14 2205    Clinical  Impression Statement Patient reported he did forget  to take his medication yesterday morning which was likely the cause of his inbalance and worsening of symptoms.  He did much better this date, no loss of balance but still required CGA to minimal assist for  performance of LSVT BIG daily exercises.  Will continue to focus on improving amplitude of gait, HEP, and functional component tasks to improve independence in daily activities.    Pt will benefit from skilled therapeutic intervention in order to improve on the following deficits (Retired) Abnormal gait;Decreased knowledge of use of DME;Decreased strength;Impaired flexibility;Decreased balance;Decreased mobility;Difficulty walking;Decreased range of motion;Pain;Decreased coordination;Impaired UE functional use   Rehab Potential Good   OT Frequency 4x / week   OT Duration 4 weeks   OT Treatment/Interventions Self-care/ADL training;Therapeutic exercise;Functional Mobility Training;Patient/family education;Neuromuscular education;Balance training;DME and/or AE instruction;Gait Training;Stair Training   OT Home Exercise Plan Issued adapted version of exercises.    Consulted and Agree with Plan of Care Patient;Family member/caregiver   Family Member Consulted wife        Problem List Patient Active Problem List   Diagnosis Date Noted  . Hypertension 10/11/2014  . Hyperlipidemia 10/11/2014  . Idiopathic Parkinson's disease 05/02/2014   Achilles Dunk, OTR/L, CLT [place] 11/23/2014, 10:33 AM  [place] MAIN [facility] SERVICES [address] [place], [place], [address] Phone: [phone]   Fax:  [phone]

## 2014-11-27 ENCOUNTER — Ambulatory Visit (INDEPENDENT_AMBULATORY_CARE_PROVIDER_SITE_OTHER): Payer: PPO | Admitting: Urology

## 2014-11-27 ENCOUNTER — Ambulatory Visit: Payer: PPO | Admitting: Occupational Therapy

## 2014-11-27 ENCOUNTER — Other Ambulatory Visit: Payer: Self-pay | Admitting: Family Medicine

## 2014-11-27 ENCOUNTER — Encounter: Payer: Self-pay | Admitting: Urology

## 2014-11-27 VITALS — BP 143/87 | HR 68 | Ht 68.0 in | Wt 196.7 lb

## 2014-11-27 DIAGNOSIS — M6281 Muscle weakness (generalized): Secondary | ICD-10-CM

## 2014-11-27 DIAGNOSIS — Z741 Need for assistance with personal care: Secondary | ICD-10-CM

## 2014-11-27 DIAGNOSIS — R2681 Unsteadiness on feet: Secondary | ICD-10-CM

## 2014-11-27 DIAGNOSIS — R262 Difficulty in walking, not elsewhere classified: Secondary | ICD-10-CM

## 2014-11-27 DIAGNOSIS — R279 Unspecified lack of coordination: Secondary | ICD-10-CM

## 2014-11-27 DIAGNOSIS — N478 Other disorders of prepuce: Secondary | ICD-10-CM

## 2014-11-27 DIAGNOSIS — IMO0001 Reserved for inherently not codable concepts without codable children: Secondary | ICD-10-CM

## 2014-11-27 DIAGNOSIS — R460 Very low level of personal hygiene: Secondary | ICD-10-CM

## 2014-11-27 NOTE — Telephone Encounter (Signed)
Refill request was sent to Dr. Norton Blizzard for approval and submission.

## 2014-11-27 NOTE — Therapy (Addendum)
[place] MAIN [facility] SERVICES [place], [place], [address] Phone: [phone]   Fax:  [phone]  Occupational Therapy Treatment Occupational Therapy Progress Note 11/05/2014 to 11/27/2014    Patient Details  Name: Lawrence Valdez. MRN: [id number] Date of Birth: 16-Dec-1931 Referring Provider:  Anabel Bene, MD  Encounter Date: 11/27/2014      OT End of Session - 11/27/14 0946    Visit Number 10   Number of Visits 17   Date for OT Re-Evaluation 12/12/14   OT Start Time 0827   OT Stop Time 0931   OT Time Calculation (min) 64 min   Activity Tolerance Patient tolerated treatment well   Behavior During Therapy [facility] for tasks assessed/performed      Past Medical History  Diagnosis Date  . Hypertension   . Pacemaker   . Hypothyroidism   . Chronic kidney disease     enlarged prostrate  . Depression   . Dysrhythmia   . Arthritis   . Hypothyroid   . Bradycardia   . Vertigo     Past Surgical History  Procedure Laterality Date  . Insert / replace / remove pacemaker      dr Ubaldo Glassing  medtronic     09  . Cardiac catheterization      09  . Appendectomy    . Thumbs      trigger thumbs  . Cataract extraction    . Lumbar laminectomy/decompression microdiscectomy  08/06/2011    Procedure: LUMBAR LAMINECTOMY/DECOMPRESSION MICRODISCECTOMY 2 LEVELS;  Surgeon: Eustace Moore, MD;  Location: [place] [facility];  Service: Neurosurgery;  Laterality: N/A;  Lumbar two three, lumbar three four decompressive laminectomy  . Partial hip arthroplasty Right 2014  . Hernia repair  20 years ago    umbiluical   . Hernia repair Left 07-25-13    inguinal  . Shoulder surgery Right   . Knee surgery Left   . Joint replacement      lt knee  . Joint replacement      right hip    There were no vitals filed for this visit.  Visit Diagnosis:  Muscle weakness (generalized)  Lack of coordination  Difficulty walking  Self-care deficit for  bathing and hygiene  Self-care deficit for dressing and grooming  Unsteadiness      Subjective Assessment - 11/27/14 0945    Subjective  Patient reports he did exercise over the weekend, reports he has trouble with rock and reach patterns with weight shifting and coordination of arms into exercises. They have been practicing in sitting to see if this would help.   Patient is accompained by: Family member   Patient Stated Goals "To get back to normal as possible"   Currently in Pain? Yes   Pain Score 1    Pain Location Hip   Pain Orientation Right   Pain Descriptors / Indicators Aching   Pain Type Chronic pain   Pain Onset More than a month ago   Pain Frequency Constant   Multiple Pain Sites No                      OT Treatments/Exercises (OP) - 11/27/14 2951    ADLs   ADL Comments Seen for Functional component tasks as follows: sit to stand, handwriting tasks, turning in small spaces, negotiation of stairs, rolling in bed. Focused this date on large scale letter formation with handwriting. Several trials and then production of  printed and scripted name with good results, increased size of letters and legibility.  Also focused on rolling in bed with cues for cues of BIG movements.   Neurological Re-education Exercises   Other Exercises 1 LSVT Daily Session Maximal Daily Exercises: Sustained movements are designed to rescale the amplitude of movement output for generalization to daily functional activities. Performed as follows for 1 set of 10 repetitions each: Multi directional sustained movements- 1) Floor to ceiling, 2) Side to side. Multi directional Repetitive movements performed in standing and are designed to provide retraining effort needed for sustained muscle activation in tasks Performed as follows: 3) Step and reach forward, 4) Step and Reach Backwards, 5) Step and reach sideways, 6) Rock and reach forward/backward, 7) Rock and reach sideways. Each exercise  performed with CGA with occasional min assist for balance recovery. Sit to stand from mat table on lowest setting with cues for weight shift, technique and CGA for 5 reps for 2 sets.  Patient also requires cues for posture and head position during functional mobility. Patient requires cues for BIG movements and cadence6 minute walk test 525 feet.  5 times sit to stand 20 secs.                OT Education - 11/27/14 0945    Education provided Yes   Education Details Rolling in bed to get up using BIG principles, handwriting tasks.   Person(s) Educated Patient   Methods Explanation;Demonstration;Verbal cues   Comprehension Verbal cues required;Returned demonstration;Verbalized understanding             OT Long Term Goals - 11/27/14 0947    OT LONG TERM GOAL #1   Title Patient will improve gait speed and endurance and be able to walk 600 feet in 6 minutes to negotiate around the home and community safely    Baseline 485 feet at evaluation, 525 at 10th visit   Time 4   Period Weeks   Status On-going   OT LONG TERM GOAL #2   Title Patient will complete HEP for maximal daily exercises with modified independence    Time 4   Period Weeks   Status On-going   OT LONG TERM GOAL #3   Title Patient will transfer from sit to stand without the use of arms safely and independently from a variety of chairs/surfaces.   Baseline Moderate difficulty at evaluation and requires 47 secs to complete 5 times sit to stand.   Time 4   Period Weeks   Status On-going   OT LONG TERM GOAL #4   Title Patient will demo increased size of print with hand writing and legibility of greater than 80% to fill out important papers.   Baseline decreased legibility to 25% with scripted handwriting at evaluation, moderate micrographia, improved at 10th visit with cues and lined paper   Time 4   Period Weeks   Status On-going   OT LONG TERM GOAL #5   Title Patient will decrease freezing of gait behaviors  on a daily basis and reduce score on Freezing of Gait Questionairre to 12 or less to decrease risk for falls.    Baseline Patient scored 15 on FOG at Eval, score of 13 at 10th visit.   Time 4   Period Weeks   Status On-going               Plan - 11/27/14 0947    Clinical Impression Statement Patient has made good progress towards goals since starting  therapy.  5 times sit to stand has greatly improved to 20 secs from 47 secs and his 6 minute walk test has improved to 525 feet.  He still has difficulty with balance while performing exercises and requires CGA assist for most in standing.  He has difficulty recalling exercise sequence and arm placements but responds well to cues by therapist.  He continues to work towards functional component tasks and has made good progress on handwriting with increased size of letters and legibility.  He will continue to benefit from skilled OT for LSVT BIG program to improve amplitude of gait, balance and independence in daily tasks.   Pt will benefit from skilled therapeutic intervention in order to improve on the following deficits (Retired) Abnormal gait;Decreased knowledge of use of DME;Decreased strength;Impaired flexibility;Decreased balance;Decreased mobility;Difficulty walking;Decreased range of motion;Pain;Decreased coordination;Impaired UE functional use   OT Frequency 4x / week   OT Duration 4 weeks   OT Treatment/Interventions Self-care/ADL training;Therapeutic exercise;Functional Mobility Training;Patient/family education;Neuromuscular education;Balance training;DME and/or AE instruction;Gait Training;Stair Training   OT Home Exercise Plan LSVT BIG maximal daily exercises, handwriting   Consulted and Agree with Plan of Care Patient;Family member/caregiver   Family Member Consulted wife          G-Codes - 12-22-2014 0950    Functional Assessment Tool Used 6 minute walk test, 5 times sit to stand, clnical judgement   Functional Limitation  Mobility: Walking and moving around   Mobility: Walking and Moving Around Current Status [phone]) At least 60 percent but less than 80 percent impaired, limited or restricted   Mobility: Walking and Moving Around Goal Status [phone]) At least 40 percent but less than 60 percent impaired, limited or restricted      Problem List Patient Active Problem List   Diagnosis Date Noted  . Hypertension 10/11/2014  . Hyperlipidemia 10/11/2014  . Idiopathic Parkinson's disease 05/02/2014   Maniyah Moller T Tomasita Morrow, OTR/L, CLT [place],Eri Mcevers 2014/12/22, 10:02 AM  [place] MAIN [facility] SERVICES [address] [place], [place], [address] Phone: [phone]   Fax:  [phone]

## 2014-11-27 NOTE — Telephone Encounter (Signed)
Pt would like a refill on Levothyroxine. Pt has an appt on 11/29/14

## 2014-11-27 NOTE — Progress Notes (Signed)
11/27/2014 11:43 AM   Lawrence Coffee Sr. 1931-11-10 [id number]  Referring provider: Roselee Nova, MD [address] [place], [place] [address]  Chief Complaint  Patient presents with  . Penis Pain    problems with the foreskin on his penis     HPI: Patient is status postop KTP laser for BPH. Voiding well very pleased with his voiding. He wants to have circumcision. His phimosis is worsening. He still can retract the foreskin but it's very difficult. Just recently married and I think this will enhances his sex life.   PMH: Past Medical History  Diagnosis Date  . Hypertension   . Pacemaker   . Hypothyroidism   . Chronic kidney disease     enlarged prostrate  . Depression   . Dysrhythmia   . Arthritis   . Hypothyroid   . Bradycardia   . Vertigo     Surgical History: Past Surgical History  Procedure Laterality Date  . Insert / replace / remove pacemaker      dr Ubaldo Glassing  medtronic     09  . Cardiac catheterization      09  . Appendectomy    . Thumbs      trigger thumbs  . Cataract extraction    . Lumbar laminectomy/decompression microdiscectomy  08/06/2011    Procedure: LUMBAR LAMINECTOMY/DECOMPRESSION MICRODISCECTOMY 2 LEVELS;  Surgeon: Eustace Moore, MD;  Location: [place] [facility];  Service: Neurosurgery;  Laterality: N/A;  Lumbar two three, lumbar three four decompressive laminectomy  . Partial hip arthroplasty Right 2014  . Hernia repair  20 years ago    umbiluical   . Hernia repair Left 07-25-13    inguinal  . Shoulder surgery Right   . Knee surgery Left   . Joint replacement      lt knee  . Joint replacement      right hip    Home Medications:    Medication List       This list is accurate as of: 11/27/14 11:43 AM.  Always use your most recent med list.               aspirin EC 325 MG tablet  Take 325 mg by mouth daily.     carbidopa-levodopa 50-200 MG per tablet  Commonly known as:  SINEMET CR  Take 1 tablet by mouth 2 (two)  times daily.     cholecalciferol 1000 UNITS tablet  Commonly known as:  VITAMIN D  Take 4,000 Units by mouth daily.     citalopram 10 MG tablet  Commonly known as:  CELEXA  Take 10 mg by mouth daily.     finasteride 5 MG tablet  Commonly known as:  PROSCAR  Take 5 mg by mouth at bedtime.     Fish Oil 500 MG Caps  Take 2 capsules by mouth daily.     fluticasone 50 MCG/ACT nasal spray  Commonly known as:  FLONASE  Place 2 sprays into both nostrils daily.     levothyroxine 75 MCG tablet  Commonly known as:  SYNTHROID, LEVOTHROID  Take 1 tablet (75 mcg total) by mouth daily.     lisinopril 10 MG tablet  Commonly known as:  PRINIVIL,ZESTRIL  Take 1 tablet (10 mg total) by mouth daily.     loratadine 10 MG tablet  Commonly known as:  CLARITIN  Take 1 tablet (10 mg total) by mouth daily.     meclizine 25 MG tablet  Commonly  known as:  ANTIVERT  Take 25 mg by mouth 3 (three) times daily as needed. For dizziness     Melatonin 5 MG Tabs  Take 1 tablet by mouth at bedtime.     multivitamin with minerals Tabs tablet  Take 1 tablet by mouth daily.     polyethylene glycol packet  Commonly known as:  MIRALAX / GLYCOLAX  Take 17 g by mouth at bedtime.     pravastatin 20 MG tablet  Commonly known as:  PRAVACHOL  Take 1 tablet (20 mg total) by mouth daily.     tamsulosin 0.4 MG Caps capsule  Commonly known as:  FLOMAX  Take 0.4 mg by mouth daily.     traMADol 50 MG tablet  Commonly known as:  ULTRAM  Take 50 mg by mouth every 4 (four) hours as needed.     vitamin E 1000 UNIT capsule  Take 1,000 Units by mouth daily.        Allergies:  Allergies  Allergen Reactions  . No Known Allergies   . Adhesive [Tape] Itching and Other (See Comments)    blisters    Family History: Family History  Problem Relation Age of Onset  . Prostate cancer Brother     Social History:  reports that he quit smoking about 53 years ago. He has never used smokeless tobacco. He reports  that he does not drink alcohol or use illicit drugs.  ROS:   GU voiding well no hesitancy urgency frequency dysuria. Complaint of phimosis     GI a recent 15 pound weight loss that he is trying to lose weight on dietary restriction and exercise no constipation or diarrhea    CV minor arteriosclerotic disease not symptomatic                                Physical Exam: BP 143/87 mmHg  Pulse 68  Ht 5\' 8"  (1.727 m)  Wt 196 lb 11.2 oz (89.223 kg)  BMI 29.92 kg/m2  Constitutional:  Alert and oriented, No acute distress. HEENT: [place] AT, moist mucus membranes.  Trachea midline, no masses. Cardiovascular: No clubbing, cyanosis, or edema. Respiratory: Normal respiratory effort, no increased work of breathing. GI: Abdomen is soft, nontender, nondistended, no abdominal masses GU: No CVA tenderness. Phimosis that he is worsening Skin: No rashes, bruises or suspicious lesions. Lymph: No cervical or inguinal adenopathy. Neurologic: Grossly intact, no focal deficits, moving all 4 extremities. Psychiatric: Normal mood and affect.  Laboratory Data: Lab Results  Component Value Date   WBC 6.2 06/19/2014   HGB 14.0 06/19/2014   HCT 41.2 06/19/2014   MCV 91 06/19/2014   PLT 143* 06/19/2014    Lab Results  Component Value Date   CREATININE 1.02 10/11/2014    No results found for: PSA  No results found for: TESTOSTERONE  No results found for: HGBA1C  Urinalysis    Component Value Date/Time   COLORURINE Yellow 06/19/2014 1920   APPEARANCEUR Clear 06/19/2014 1920   LABSPEC 1.012 06/19/2014 1920   PHURINE 5.0 06/19/2014 1920   GLUCOSEU Negative 06/19/2014 1920   HGBUR Negative 06/19/2014 1920   BILIRUBINUR Negative 06/19/2014 1920   KETONESUR Negative 06/19/2014 1920   PROTEINUR Negative 06/19/2014 1920   NITRITE Negative 06/19/2014 1920   LEUKOCYTESUR Trace 06/19/2014 1920    Pertinent Imaging: None  Assessment & Plan:  Phimosis. Plan office circumcision.  Patient should be off aspirin for at  least 4 days  There are no diagnoses linked to this encounter.  No Follow-up on file.  Collier Flowers, [facility] [address] [place], [place] [address] [phone]

## 2014-11-28 ENCOUNTER — Ambulatory Visit: Payer: PPO | Admitting: Occupational Therapy

## 2014-11-28 DIAGNOSIS — R279 Unspecified lack of coordination: Secondary | ICD-10-CM

## 2014-11-28 DIAGNOSIS — Z741 Need for assistance with personal care: Secondary | ICD-10-CM

## 2014-11-28 DIAGNOSIS — R262 Difficulty in walking, not elsewhere classified: Secondary | ICD-10-CM

## 2014-11-28 DIAGNOSIS — M6281 Muscle weakness (generalized): Secondary | ICD-10-CM | POA: Diagnosis not present

## 2014-11-28 DIAGNOSIS — R460 Very low level of personal hygiene: Secondary | ICD-10-CM

## 2014-11-28 DIAGNOSIS — R2681 Unsteadiness on feet: Secondary | ICD-10-CM

## 2014-11-29 ENCOUNTER — Ambulatory Visit: Payer: PPO | Admitting: Occupational Therapy

## 2014-11-29 ENCOUNTER — Ambulatory Visit (INDEPENDENT_AMBULATORY_CARE_PROVIDER_SITE_OTHER): Payer: PPO | Admitting: Family Medicine

## 2014-11-29 ENCOUNTER — Encounter: Payer: Self-pay | Admitting: Family Medicine

## 2014-11-29 VITALS — BP 140/77 | HR 70 | Temp 97.8°F | Resp 18 | Ht 68.0 in | Wt 195.3 lb

## 2014-11-29 DIAGNOSIS — E038 Other specified hypothyroidism: Secondary | ICD-10-CM | POA: Diagnosis not present

## 2014-11-29 DIAGNOSIS — R0789 Other chest pain: Secondary | ICD-10-CM | POA: Diagnosis not present

## 2014-11-29 MED ORDER — LEVOTHYROXINE SODIUM 75 MCG PO TABS: 75.0000 ug | ORAL_TABLET | Freq: Every day | ORAL | Status: DC

## 2014-11-29 NOTE — Therapy (Signed)
[place] MAIN [facility] SERVICES [address] [place], [place], [address] Phone: [phone]   Fax:  [phone]  Occupational Therapy Treatment  Patient Details  Name: Lawrence OKI Sr. MRN: [id number] Date of Birth: 02-Jan-1932 Referring Provider:  Roselee Nova, MD  Encounter Date: 11/28/2014      OT End of Session - 11/28/14 1615    Visit Number 11   Number of Visits 17   Date for OT Re-Evaluation 12/12/14   OT Start Time 0830   OT Stop Time 0930   OT Time Calculation (min) 60 min   Activity Tolerance Patient tolerated treatment well   Behavior During Therapy [facility] for tasks assessed/performed      Past Medical History  Diagnosis Date  . Hypertension   . Pacemaker   . Hypothyroidism   . Chronic kidney disease     enlarged prostrate  . Depression   . Dysrhythmia   . Arthritis   . Hypothyroid   . Bradycardia   . Vertigo     Past Surgical History  Procedure Laterality Date  . Insert / replace / remove pacemaker      dr Ubaldo Glassing  medtronic     09  . Cardiac catheterization      09  . Appendectomy    . Thumbs      trigger thumbs  . Cataract extraction    . Lumbar laminectomy/decompression microdiscectomy  08/06/2011    Procedure: LUMBAR LAMINECTOMY/DECOMPRESSION MICRODISCECTOMY 2 LEVELS;  Surgeon: Eustace Moore, MD;  Location: [place] [facility];  Service: Neurosurgery;  Laterality: N/A;  Lumbar two three, lumbar three four decompressive laminectomy  . Partial hip arthroplasty Right 2014  . Hernia repair  20 years ago    umbiluical   . Hernia repair Left 07-25-13    inguinal  . Shoulder surgery Right   . Knee surgery Left   . Joint replacement      lt knee  . Joint replacement      right hip    There were no vitals filed for this visit.  Visit Diagnosis:  Muscle weakness (generalized)  Lack of coordination  Difficulty walking  Self-care deficit for bathing and hygiene  Self-care deficit for dressing and  grooming  Unsteadiness      Subjective Assessment - 11/28/14 1611    Subjective  Patient reports his hip is hurting a little more today for some reason at the beginning of session.  Thinks he may have a sinus infection and is planning to go to the doctor tomorrow.     Patient is accompained by: Family member   Patient Stated Goals "To get back to normal as possible"   Pain Score 3    Pain Location Hip   Pain Orientation Right   Pain Descriptors / Indicators Aching   Pain Type Chronic pain   Pain Onset More than a month ago   Multiple Pain Sites No                      OT Treatments/Exercises (OP) - 11/28/14 0841    ADLs   ADL Comments Seen for Functional component tasks as follows: sit to stand, handwriting tasks, turning in small spaces, negotiation of stairs, rolling in bed. Focused this date on large scale letter formation with handwriting. Several trials and then production of printed and scripted name with good results, increased size of letters and legibility. Also focused on rolling in bed with  cues for cues of BIG movements.   Neurological Re-education Exercises   Other Exercises 1 LSVT Daily Session Maximal Daily Exercises: Sustained movements are designed to rescale the amplitude of movement output for generalization to daily functional activities. Performed as follows for 1 set of 10 repetitions each: Multi directional sustained movements- 1) Floor to ceiling, 2) Side to side. Multi directional Repetitive movements performed in standing and are designed to provide retraining effort needed for sustained muscle activation in tasks Performed as follows: 3) Step and reach forward, 4) Step and Reach Backwards, 5) Step and reach sideways, 6) Rock and reach forward/backward, 7) Rock and reach sideways. Each exercise performed with CGA with occasional min assist for balance recovery. Sit to stand from mat table on lowest setting with cues for weight shift, technique and CGA  for 5 reps for 2 sets. Patient also requires cues for posture and head position during functional mobility.    Other Exercises 2 Patient seen for functional mobility tasks this date with emphasis on gait speed, length of steps with short distance ambulation, 350 feet for 2 trials using standard cane, cues for amplitude of gait, arm swing and weight shifting. Patient also requires cues for posture and head position during functional mobility. Patient requires cues for BIG movements and cadence                OT Education - 11/28/14 1614    Education provided Yes   Education Details HEP, functional component tasks   Person(s) Educated Patient;Spouse   Methods Explanation;Demonstration;Verbal cues   Comprehension Verbal cues required;Returned demonstration;Verbalized understanding             OT Long Term Goals - 11/27/14 0947    OT LONG TERM GOAL #1   Title Patient will improve gait speed and endurance and be able to walk 600 feet in 6 minutes to negotiate around the home and community safely    Baseline 485 feet at evaluation, 525 at 10th visit   Time 4   Period Weeks   Status On-going   OT LONG TERM GOAL #2   Title Patient will complete HEP for maximal daily exercises with modified independence    Time 4   Period Weeks   Status On-going   OT LONG TERM GOAL #3   Title Patient will transfer from sit to stand without the use of arms safely and independently from a variety of chairs/surfaces.   Baseline Moderate difficulty at evaluation and requires 47 secs to complete 5 times sit to stand.   Time 4   Period Weeks   Status On-going   OT LONG TERM GOAL #4   Title Patient will demo increased size of print with hand writing and legibility of greater than 80% to fill out important papers.   Baseline decreased legibility to 25% with scripted handwriting at evaluation, moderate micrographia, improved at 10th visit with cues and lined paper   Time 4   Period Weeks   Status  On-going   OT LONG TERM GOAL #5   Title Patient will decrease freezing of gait behaviors on a daily basis and reduce score on Freezing of Gait Questionairre to 12 or less to decrease risk for falls.    Baseline Patient scored 15 on FOG at Eval, score of 13 at 10th visit.   Time 4   Period Weeks   Status On-going               Plan - 11/28/14 1616  Clinical Impression Statement Patient has been sick this week with a possible sinus infection.  He has continued to work on his maximal daily exercises with wife and requires cues to perform.  He is still using the adapted version at home using a chair due to decreased balance.  In the clinic, therapist is providing CGA to min assist with exercises in standing using standard version of exercises.  Handwriting has improved greatly with size and  legibility.     Pt will benefit from skilled therapeutic intervention in order to improve on the following deficits (Retired) Abnormal gait;Decreased knowledge of use of DME;Decreased strength;Impaired flexibility;Decreased balance;Decreased mobility;Difficulty walking;Decreased range of motion;Pain;Decreased coordination;Impaired UE functional use   OT Frequency 4x / week   OT Duration 4 weeks   OT Treatment/Interventions Self-care/ADL training;Therapeutic exercise;Functional Mobility Training;Patient/family education;Neuromuscular education;Balance training;DME and/or AE instruction;Gait Training;Stair Training   OT Home Exercise Plan LSVT BIG maximal daily exercises, handwriting   Consulted and Agree with Plan of Care Patient;Family member/caregiver   Family Member Consulted wife        Problem List Patient Active Problem List   Diagnosis Date Noted  . Hypertension 10/11/2014  . Hyperlipidemia 10/11/2014  . Idiopathic Parkinson's disease 05/02/2014  . Leg pain, right 05/02/2014  . Back pain, chronic 12/28/2013  . Clinical depression 08/29/2013  . Has a tremor 08/29/2013  . Mechanical and  motor problems with internal organs 08/29/2013  . Disordered sleep 08/29/2013  . Amnesia 08/29/2013  . Fatigue 08/29/2013  . Fall 08/29/2013  . Dizziness 08/29/2013  . Difficulty in walking 08/29/2013  . Back ache 08/16/2013  . Adult hypothyroidism 06/16/2013  . Acquired trigger finger 06/16/2013  . Arrhythmia, sinus node 06/16/2013  . HLD (hyperlipidemia) 06/16/2013  . Benign essential HTN 06/16/2013   CLARION MOONEYHAN, OTR/L, CLT [place],Persephone Schriever 11/29/2014, 8:46 AM  [place] MAIN [facility] SERVICES [address] [place], [place], [address] Phone: [phone]   Fax:  [phone]

## 2014-11-29 NOTE — Progress Notes (Signed)
Name: Lawrence MCKINNON Sr.   MRN: [id number]    DOB: 07/02/1931   Date:11/29/2014       Progress Note  Subjective  Chief Complaint  Chief Complaint  Patient presents with  . Medication Refill  . Hyperlipidemia  . Back Pain    Thyroid Problem Presents for follow-up visit. Symptoms include cold intolerance, depressed mood, fatigue and weight gain. Past treatments include levothyroxine.   Chest Wall Pain Intermittent but recurrent chest wall pain on the left side present for a few weeks. No radiaition, no associated symptoms (fevers, chills, shortness of breath).   Past Medical History  Diagnosis Date  . Hypertension   . Pacemaker   . Hypothyroidism   . Chronic kidney disease     enlarged prostrate  . Depression   . Dysrhythmia   . Arthritis   . Hypothyroid   . Bradycardia   . Vertigo     Past Surgical History  Procedure Laterality Date  . Insert / replace / remove pacemaker      dr Ubaldo Glassing  medtronic     09  . Cardiac catheterization      09  . Appendectomy    . Thumbs      trigger thumbs  . Cataract extraction    . Lumbar laminectomy/decompression microdiscectomy  08/06/2011    Procedure: LUMBAR LAMINECTOMY/DECOMPRESSION MICRODISCECTOMY 2 LEVELS;  Surgeon: Eustace Moore, MD;  Location: [place] [facility];  Service: Neurosurgery;  Laterality: N/A;  Lumbar two three, lumbar three four decompressive laminectomy  . Partial hip arthroplasty Right 2014  . Hernia repair  20 years ago    umbiluical   . Hernia repair Left 07-25-13    inguinal  . Shoulder surgery Right   . Knee surgery Left   . Joint replacement      lt knee  . Joint replacement      right hip    Family History  Problem Relation Age of Onset  . Prostate cancer Brother     Social History   Social History  . Marital Status: Widowed    Spouse Name: N/A  . Number of Children: N/A  . Years of Education: N/A   Occupational History  . Not on file.   Social History Main Topics  . Smoking status: Former  Smoker    Quit date: 08/04/1961  . Smokeless tobacco: Never Used  . Alcohol Use: No     Comment: socially  . Drug Use: No  . Sexual Activity: Not on file   Other Topics Concern  . Not on file   Social History Narrative     Current outpatient prescriptions:  .  aspirin EC 325 MG tablet, Take 325 mg by mouth daily., Disp: , Rfl:  .  carbidopa-levodopa (SINEMET CR) 50-200 MG per tablet, Take 1 tablet by mouth 2 (two) times daily., Disp: , Rfl:  .  cholecalciferol (VITAMIN D) 1000 UNITS tablet, Take 4,000 Units by mouth daily., Disp: , Rfl:  .  citalopram (CELEXA) 10 MG tablet, Take 10 mg by mouth daily., Disp: , Rfl:  .  finasteride (PROSCAR) 5 MG tablet, Take 5 mg by mouth at bedtime., Disp: , Rfl:  .  fluticasone (FLONASE) 50 MCG/ACT nasal spray, Place 2 sprays into both nostrils daily., Disp: , Rfl:  .  levothyroxine (SYNTHROID, LEVOTHROID) 75 MCG tablet, Take 1 tablet (75 mcg total) by mouth daily before breakfast., Disp: 30 tablet, Rfl: 2 .  lisinopril (PRINIVIL,ZESTRIL) 10 MG tablet, Take 1 tablet (10  mg total) by mouth daily., Disp: 90 tablet, Rfl: 0 .  loratadine (CLARITIN) 10 MG tablet, Take 1 tablet (10 mg total) by mouth daily., Disp: 30 tablet, Rfl: 0 .  meclizine (ANTIVERT) 25 MG tablet, Take 25 mg by mouth 3 (three) times daily as needed. For dizziness, Disp: , Rfl:  .  Melatonin 5 MG TABS, Take 1 tablet by mouth at bedtime., Disp: , Rfl:  .  Multiple Vitamin (MULITIVITAMIN WITH MINERALS) TABS, Take 1 tablet by mouth daily., Disp: , Rfl:  .  Omega-3 Fatty Acids (FISH OIL) 500 MG CAPS, Take 2 capsules by mouth daily., Disp: , Rfl:  .  polyethylene glycol (MIRALAX / GLYCOLAX) packet, Take 17 g by mouth at bedtime., Disp: , Rfl:  .  pravastatin (PRAVACHOL) 20 MG tablet, Take 1 tablet (20 mg total) by mouth daily., Disp: 90 tablet, Rfl: 0 .  Tamsulosin HCl (FLOMAX) 0.4 MG CAPS, Take 0.4 mg by mouth daily., Disp: , Rfl:  .  traMADol (ULTRAM) 50 MG tablet, Take 50 mg by mouth  every 4 (four) hours as needed., Disp: , Rfl:  .  vitamin E 1000 UNIT capsule, Take 1,000 Units by mouth daily., Disp: , Rfl:   Allergies  Allergen Reactions  . No Known Allergies   . Adhesive [Tape] Itching and Other (See Comments)    blisters     Review of Systems  Constitutional: Positive for weight gain and fatigue.  Endo/Heme/Allergies: Positive for cold intolerance.    Objective  Filed Vitals:   11/29/14 1128  BP: 140/77  Pulse: 70  Temp: 97.8 F (36.6 C)  TempSrc: Oral  Resp: 18  Height: 5\' 8"  (1.727 m)  Weight: 195 lb 4.8 oz (88.587 kg)  SpO2: 96%    Physical Exam  Constitutional: He is well-developed, well-nourished, and in no distress.  Cardiovascular: Normal rate and regular rhythm.   Pulmonary/Chest: Effort normal and breath sounds normal. He exhibits tenderness. He exhibits no mass. Left breast exhibits no mass.    Tenderness to palpation over the left anterior chest wall, no mass palpated. No rash or other abnormality  Nursing note and vitals reviewed.   Recent Results (from the past 2160 hour(s))  Lipid panel     Status: Abnormal   Collection Time: 10/11/14 11:41 AM  Result Value Ref Range   Cholesterol, Total 168 100 - 199 mg/dL   Triglycerides 145 0 - 149 mg/dL   HDL 38 (L) >39 mg/dL    Comment: According to ATP-III Guidelines, HDL-C >59 mg/dL is considered a negative risk factor for CHD.    VLDL Cholesterol Cal 29 5 - 40 mg/dL   LDL Calculated 101 (H) 0 - 99 mg/dL   Chol/HDL Ratio 4.4 0.0 - 5.0 ratio units    Comment:                                   T. Chol/HDL Ratio                                             Men  Women                               1/2 Avg.Risk  3.4    3.3  Avg.Risk  5.0    4.4                                2X Avg.Risk  9.6    7.1                                3X Avg.Risk 23.4   11.0   Comprehensive metabolic panel     Status: Abnormal   Collection Time: 10/11/14 11:41 AM  Result  Value Ref Range   Glucose 105 (H) 65 - 99 mg/dL   BUN 12 8 - 27 mg/dL   Creatinine, Ser 1.02 0.76 - 1.27 mg/dL   GFR calc non Af Amer 69 >59 mL/min/1.73   GFR calc Af Amer 80 >59 mL/min/1.73   BUN/Creatinine Ratio 12 10 - 22   Sodium 143 134 - 144 mmol/L   Potassium 4.6 3.5 - 5.2 mmol/L   Chloride 103 97 - 108 mmol/L   CO2 24 18 - 29 mmol/L   Calcium 9.4 8.6 - 10.2 mg/dL   Total Protein 6.6 6.0 - 8.5 g/dL   Albumin 4.1 3.5 - 4.7 g/dL   Globulin, Total 2.5 1.5 - 4.5 g/dL   Albumin/Globulin Ratio 1.6 1.1 - 2.5   Bilirubin Total 0.3 0.0 - 1.2 mg/dL   Alkaline Phosphatase 59 39 - 117 IU/L   AST 19 0 - 40 IU/L   ALT 4 0 - 44 IU/L   Assessment & Plan  1. Other specified hypothyroidism Recheck TSH and adjust medication as appropriate. - TSH  2. Chest wall pain Intermittent left anterior chest wall pain. Patient reassured. If the pain does not resolve, follow-up CT scan of the chest without contrast will be considered.   Lawrence Valdez Asad A. [facility] 11/29/2014 12:04 PM

## 2014-11-30 LAB — TSH: TSH: 1.87 u[IU]/mL (ref 0.450–4.500)

## 2014-12-03 ENCOUNTER — Ambulatory Visit: Payer: PPO | Admitting: Occupational Therapy

## 2014-12-03 DIAGNOSIS — R460 Very low level of personal hygiene: Secondary | ICD-10-CM

## 2014-12-03 DIAGNOSIS — M6281 Muscle weakness (generalized): Secondary | ICD-10-CM | POA: Diagnosis not present

## 2014-12-03 DIAGNOSIS — R2681 Unsteadiness on feet: Secondary | ICD-10-CM

## 2014-12-03 DIAGNOSIS — R279 Unspecified lack of coordination: Secondary | ICD-10-CM

## 2014-12-03 DIAGNOSIS — Z741 Need for assistance with personal care: Secondary | ICD-10-CM

## 2014-12-03 DIAGNOSIS — R262 Difficulty in walking, not elsewhere classified: Secondary | ICD-10-CM

## 2014-12-04 ENCOUNTER — Ambulatory Visit: Payer: PPO | Admitting: Family Medicine

## 2014-12-04 ENCOUNTER — Encounter: Payer: Self-pay | Admitting: Occupational Therapy

## 2014-12-04 ENCOUNTER — Ambulatory Visit: Payer: PPO | Admitting: Occupational Therapy

## 2014-12-04 DIAGNOSIS — R279 Unspecified lack of coordination: Secondary | ICD-10-CM

## 2014-12-04 DIAGNOSIS — M6281 Muscle weakness (generalized): Secondary | ICD-10-CM

## 2014-12-04 DIAGNOSIS — R2681 Unsteadiness on feet: Secondary | ICD-10-CM

## 2014-12-04 DIAGNOSIS — Z741 Need for assistance with personal care: Secondary | ICD-10-CM

## 2014-12-04 DIAGNOSIS — R262 Difficulty in walking, not elsewhere classified: Secondary | ICD-10-CM

## 2014-12-04 DIAGNOSIS — R460 Very low level of personal hygiene: Secondary | ICD-10-CM

## 2014-12-04 NOTE — Therapy (Signed)
[place] MAIN [facility] SERVICES [address] [place], [place], [address] Phone: [phone]   Fax:  [phone]  Occupational Therapy Treatment  Patient Details  Name: Lawrence TRUEBA Sr. MRN: [id number] Date of Birth: 06/22/1931 Referring Provider:  Roselee Nova, MD  Encounter Date: 12/03/2014      OT End of Session - 12/03/14 0851    Visit Number 12   Number of Visits 17   Date for OT Re-Evaluation 12/12/14   OT Start Time 0827   OT Stop Time 0930   OT Time Calculation (min) 63 min   Activity Tolerance Patient tolerated treatment well   Behavior During Therapy [facility] for tasks assessed/performed      Past Medical History  Diagnosis Date  . Hypertension   . Pacemaker   . Hypothyroidism   . Chronic kidney disease     enlarged prostrate  . Depression   . Dysrhythmia   . Arthritis   . Hypothyroid   . Bradycardia   . Vertigo     Past Surgical History  Procedure Laterality Date  . Insert / replace / remove pacemaker      dr Ubaldo Glassing  medtronic     09  . Cardiac catheterization      09  . Appendectomy    . Thumbs      trigger thumbs  . Cataract extraction    . Lumbar laminectomy/decompression microdiscectomy  08/06/2011    Procedure: LUMBAR LAMINECTOMY/DECOMPRESSION MICRODISCECTOMY 2 LEVELS;  Surgeon: Eustace Moore, MD;  Location: [place] [facility]UR[facility] ORS;  Service: Neurosurgery;  Laterality: N/A;  Lumbar two three, lumbar three four decompressive laminectomy  . Partial hip arthroplasty Right 2014  . Hernia repair  20 years ago    umbiluical   . Hernia repair Left 07-25-13    inguinal  . Shoulder surgery Right   . Knee surgery Left   . Joint replacement      lt knee  . Joint replacement      right hip    There were no vitals filed for this visit.  Visit Diagnosis:  Muscle weakness (generalized)  Lack of coordination  Difficulty walking  Self-care deficit for bathing and hygiene  Self-care deficit for dressing and  grooming  Unsteadiness      Subjective Assessment - 12/03/14 0833    Subjective  Mild hip pain on the right side, 1/10.     Patient is accompained by: Family member   Patient Stated Goals "To get back to normal as possible"   Pain Score 1    Pain Location Hip   Pain Orientation Right   Pain Descriptors / Indicators Aching   Pain Type Chronic pain   Pain Frequency Constant   Multiple Pain Sites No                      OT Treatments/Exercises (OP) - 12/03/14 0853    ADLs   ADL Comments (p) Functional component tasks this date:  sit to stand, rolling to right side to get out of bed, handwriting for name, address and short sentences, stair negotiation and turning in small spaces.    Neurological Re-education Exercises   Other Exercises 1 (p) LSVT Daily Session Maximal Daily Exercises: Sustained movements are designed to rescale the amplitude of movement output for generalization to daily functional activities. Performed as follows for 1 set of 10 repetitions each: Multi directional sustained movements- 1) Floor to ceiling, 2) Side to  side. Multi directional Repetitive movements performed in standing and are designed to provide retraining effort needed for sustained muscle activation in tasks Performed as follows: 3) Step and reach forward, 4) Step and Reach Backwards, 5) Step and reach sideways, 6) Rock and reach forward/backward, 7) Rock and reach sideways. Each exercise performed with CGA with occasional min assist for balance recovery. Sit to stand from mat table on lowest setting with cues for weight shift, technique and SBA for 10 reps. Patient also requires cues for posture and head position during functional mobility.       Functional mobility for 3 trials of 250 feet this date with use of cane and cues for amplitude of steps.  Short rest breaks required between sets.          OT Education - 12/03/14 0849    Education provided Yes   Education Details HEP with  standard and adaptive exercises   Person(s) Educated Patient;Spouse   Methods Explanation;Demonstration;Verbal cues   Comprehension Verbal cues required;Returned demonstration;Verbalized understanding             OT Long Term Goals - 11/27/14 0947    OT LONG TERM GOAL #1   Title Patient will improve gait speed and endurance and be able to walk 600 feet in 6 minutes to negotiate around the home and community safely    Baseline 485 feet at evaluation, 525 at 10th visit   Time 4   Period Weeks   Status On-going   OT LONG TERM GOAL #2   Title Patient will complete HEP for maximal daily exercises with modified independence    Time 4   Period Weeks   Status On-going   OT LONG TERM GOAL #3   Title Patient will transfer from sit to stand without the use of arms safely and independently from a variety of chairs/surfaces.   Baseline Moderate difficulty at evaluation and requires 47 secs to complete 5 times sit to stand.   Time 4   Period Weeks   Status On-going   OT LONG TERM GOAL #4   Title Patient will demo increased size of print with hand writing and legibility of greater than 80% to fill out important papers.   Baseline decreased legibility to 25% with scripted handwriting at evaluation, moderate micrographia, improved at 10th visit with cues and lined paper   Time 4   Period Weeks   Status On-going   OT LONG TERM GOAL #5   Title Patient will decrease freezing of gait behaviors on a daily basis and reduce score on Freezing of Gait Questionairre to 12 or less to decrease risk for falls.    Baseline Patient scored 15 on FOG at Eval, score of 13 at 10th visit.   Time 4   Period Weeks   Status On-going               Plan - 12/03/14 7342    Clinical Impression Statement Patient performance improved this week versus last week when he had a sinus infection.  He was able to perform all standing exercises without the need for a break.  Handwriting continues to improve and  progressed to short sentences with 60% legibility this date.    Pt will benefit from skilled therapeutic intervention in order to improve on the following deficits (Retired) Abnormal gait;Decreased knowledge of use of DME;Decreased strength;Impaired flexibility;Decreased balance;Decreased mobility;Difficulty walking;Decreased range of motion;Pain;Decreased coordination;Impaired UE functional use   Rehab Potential Good   OT Frequency 4x /  week   OT Duration 4 weeks   OT Treatment/Interventions Self-care/ADL training;Therapeutic exercise;Functional Mobility Training;Patient/family education;Neuromuscular education;Balance training;DME and/or AE instruction;Gait Training;Stair Training   OT Home Exercise Plan LSVT BIG maximal daily exercises, handwriting   Consulted and Agree with Plan of Care Family member/caregiver;Patient   Family Member Consulted wife        Problem List Patient Active Problem List   Diagnosis Date Noted  . Chest wall pain 11/29/2014  . Hypertension 10/11/2014  . Hyperlipidemia 10/11/2014  . Idiopathic Parkinson's disease 05/02/2014  . Leg pain, right 05/02/2014  . Back pain, chronic 12/28/2013  . Clinical depression 08/29/2013  . Has a tremor 08/29/2013  . Mechanical and motor problems with internal organs 08/29/2013  . Disordered sleep 08/29/2013  . Amnesia 08/29/2013  . Fatigue 08/29/2013  . Fall 08/29/2013  . Dizziness 08/29/2013  . Difficulty in walking 08/29/2013  . Adult hypothyroidism 06/16/2013  . Acquired trigger finger 06/16/2013  . Arrhythmia, sinus node 06/16/2013  . HLD (hyperlipidemia) 06/16/2013  . Benign essential HTN 06/16/2013   Lawrence Valdez, OTR/L, CLT Wittke,Amy 12/04/2014, 3:01 PM  [place] MAIN [facility] SERVICES [address] [place], [place], [address] Phone: [phone]   Fax:  [phone]

## 2014-12-04 NOTE — Therapy (Signed)
[place] MAIN [facility] SERVICES [address] [place], [place], [address] Phone: [phone]   Fax:  [phone]  Occupational Therapy Treatment  Patient Details  Name: Lawrence TRUDO Sr. MRN: [id number] Date of Birth: September 13, 1931 Referring Provider:  Anabel Bene, MD  Encounter Date: 12/04/2014      OT End of Session - 12/04/14 1555    Visit Number 13   Number of Visits 17   Date for OT Re-Evaluation 12/12/14   OT Start Time 0830   OT Stop Time 0930   OT Time Calculation (min) 60 min   Activity Tolerance Patient tolerated treatment well   Behavior During Therapy [facility] for tasks assessed/performed      Past Medical History  Diagnosis Date  . Hypertension   . Pacemaker   . Hypothyroidism   . Chronic kidney disease     enlarged prostrate  . Depression   . Dysrhythmia   . Arthritis   . Hypothyroid   . Bradycardia   . Vertigo     Past Surgical History  Procedure Laterality Date  . Insert / replace / remove pacemaker      dr Ubaldo Glassing  medtronic     09  . Cardiac catheterization      09  . Appendectomy    . Thumbs      trigger thumbs  . Cataract extraction    . Lumbar laminectomy/decompression microdiscectomy  08/06/2011    Procedure: LUMBAR LAMINECTOMY/DECOMPRESSION MICRODISCECTOMY 2 LEVELS;  Surgeon: Eustace Moore, MD;  Location: [place] NEURO ORS;  Service: Neurosurgery;  Laterality: N/A;  Lumbar two three, lumbar three four decompressive laminectomy  . Partial hip arthroplasty Right 2014  . Hernia repair  20 years ago    umbiluical   . Hernia repair Left 07-25-13    inguinal  . Shoulder surgery Right   . Knee surgery Left   . Joint replacement      lt knee  . Joint replacement      right hip    There were no vitals filed for this visit.  Visit Diagnosis:  Muscle weakness (generalized)  Lack of coordination  Difficulty walking  Self-care deficit for bathing and hygiene  Self-care deficit for dressing and  grooming  Unsteadiness      Subjective Assessment - 12/04/14 1551    Subjective  Patient reports he has been feeling better this week compared to last week.  Feels like he has some energy again.  Reports he wants to continue to perform and engage in exercises after therapy is over and wants to be consistent with daily tasks.   Patient is accompained by: Family member   Currently in Pain? Yes   Pain Score 2    Pain Location Hip   Pain Orientation Right   Pain Descriptors / Indicators Aching   Pain Type Chronic pain   Multiple Pain Sites No                      OT Treatments/Exercises (OP) - 12/04/14 1553    ADLs   ADL Comments Seen for Functional component tasks as follows: sit to stand, handwriting tasks, turning in small spaces, negotiation of stairs, rolling in bed. Focused this date on large scale letter formation with handwriting. Several trials and then production of printed and scripted name with good results, increased size of letters and legibility. Also focused on rolling in bed with cues for cues of BIG movements.  Neurological Re-education Exercises   Other Exercises 1 LSVT Daily Session Maximal Daily Exercises: Sustained movements are designed to rescale the amplitude of movement output for generalization to daily functional activities. Performed as follows for 1 set of 10 repetitions each: Multi directional sustained movements- 1) Floor to ceiling, 2) Side to side. Multi directional Repetitive movements performed in standing and are designed to provide retraining effort needed for sustained muscle activation in tasks Performed as follows: 3) Step and reach forward, 4) Step and Reach Backwards, 5) Step and reach sideways, 6) Rock and reach forward/backward, 7) Rock and reach sideways. Each exercise performed with CGA with occasional min assist for balance recovery. Sit to stand from mat table on lowest setting with cues for weight shift, technique and CGA for 5 reps  for 2 sets. Patient also requires cues for posture and head position during functional mobility.   Other Exercises 2 Functional mobility skills with straight cane, 2 trials of 350 feet each with cues for amplitude of gait and arm swing for left arm.  Focused on turns using BIG principles.                OT Education - 12/04/14 1555    Education provided Yes   Education Details posture, HEP   Person(s) Educated Patient;Spouse   Methods Explanation;Demonstration;Tactile cues;Verbal cues   Comprehension Verbal cues required;Tactile cues required;Returned demonstration;Verbalized understanding             OT Long Term Goals - 11/27/14 0947    OT LONG TERM GOAL #1   Title Patient will improve gait speed and endurance and be able to walk 600 feet in 6 minutes to negotiate around the home and community safely    Baseline 485 feet at evaluation, 525 at 10th visit   Time 4   Period Weeks   Status On-going   OT LONG TERM GOAL #2   Title Patient will complete HEP for maximal daily exercises with modified independence    Time 4   Period Weeks   Status On-going   OT LONG TERM GOAL #3   Title Patient will transfer from sit to stand without the use of arms safely and independently from a variety of chairs/surfaces.   Baseline Moderate difficulty at evaluation and requires 47 secs to complete 5 times sit to stand.   Time 4   Period Weeks   Status On-going   OT LONG TERM GOAL #4   Title Patient will demo increased size of print with hand writing and legibility of greater than 80% to fill out important papers.   Baseline decreased legibility to 25% with scripted handwriting at evaluation, moderate micrographia, improved at 10th visit with cues and lined paper   Time 4   Period Weeks   Status On-going   OT LONG TERM GOAL #5   Title Patient will decrease freezing of gait behaviors on a daily basis and reduce score on Freezing of Gait Questionairre to 12 or less to decrease risk for  falls.    Baseline Patient scored 15 on FOG at Eval, score of 13 at 10th visit.   Time 4   Period Weeks   Status On-going               Plan - 12/04/14 1556    Clinical Impression Statement Patient continues to progress in all areas, one loss of balance this date with assist for recovery.  patient requiring decreased rest breaks this date, cues for amplitude of steps  when turning and as he ambulates longer distances.  He progressed to writing short paragraph this date but had decreased legibility for the last half of the paragraph.    Pt will benefit from skilled therapeutic intervention in order to improve on the following deficits (Retired) Abnormal gait;Decreased knowledge of use of DME;Decreased strength;Impaired flexibility;Decreased balance;Decreased mobility;Difficulty walking;Decreased range of motion;Pain;Decreased coordination;Impaired UE functional use   Rehab Potential Good   OT Frequency 4x / week   OT Duration 4 weeks   OT Treatment/Interventions Self-care/ADL training;Therapeutic exercise;Functional Mobility Training;Patient/family education;Neuromuscular education;Balance training;DME and/or AE instruction;Gait Training;Stair Training   OT Home Exercise Plan LSVT BIG maximal daily exercises, handwriting   Consulted and Agree with Plan of Care Family member/caregiver;Patient   Family Member Consulted wife        Problem List Patient Active Problem List   Diagnosis Date Noted  . Chest wall pain 11/29/2014  . Hypertension 10/11/2014  . Hyperlipidemia 10/11/2014  . Idiopathic Parkinson's disease 05/02/2014  . Leg pain, right 05/02/2014  . Back pain, chronic 12/28/2013  . Clinical depression 08/29/2013  . Has a tremor 08/29/2013  . Mechanical and motor problems with internal organs 08/29/2013  . Disordered sleep 08/29/2013  . Amnesia 08/29/2013  . Fatigue 08/29/2013  . Fall 08/29/2013  . Dizziness 08/29/2013  . Difficulty in walking 08/29/2013  . Adult  hypothyroidism 06/16/2013  . Acquired trigger finger 06/16/2013  . Arrhythmia, sinus node 06/16/2013  . HLD (hyperlipidemia) 06/16/2013  . Benign essential HTN 06/16/2013   MUTASIM TUCKEY, OTR/L, CLT  Creeden,Clebert Wenger 12/04/2014, 3:59 PM  [place] MAIN [facility] SERVICES [address] [place], [place], [address] Phone: [phone]   Fax:  [phone]

## 2014-12-05 ENCOUNTER — Ambulatory Visit: Payer: PPO | Admitting: Occupational Therapy

## 2014-12-05 DIAGNOSIS — M6281 Muscle weakness (generalized): Secondary | ICD-10-CM

## 2014-12-05 DIAGNOSIS — R460 Very low level of personal hygiene: Secondary | ICD-10-CM

## 2014-12-05 DIAGNOSIS — Z741 Need for assistance with personal care: Secondary | ICD-10-CM

## 2014-12-05 DIAGNOSIS — R279 Unspecified lack of coordination: Secondary | ICD-10-CM

## 2014-12-05 DIAGNOSIS — R262 Difficulty in walking, not elsewhere classified: Secondary | ICD-10-CM

## 2014-12-05 DIAGNOSIS — R2681 Unsteadiness on feet: Secondary | ICD-10-CM

## 2014-12-06 ENCOUNTER — Ambulatory Visit: Payer: PPO | Admitting: Occupational Therapy

## 2014-12-06 DIAGNOSIS — R2681 Unsteadiness on feet: Secondary | ICD-10-CM

## 2014-12-06 DIAGNOSIS — R262 Difficulty in walking, not elsewhere classified: Secondary | ICD-10-CM

## 2014-12-06 DIAGNOSIS — M6281 Muscle weakness (generalized): Secondary | ICD-10-CM

## 2014-12-06 DIAGNOSIS — Z741 Need for assistance with personal care: Secondary | ICD-10-CM

## 2014-12-06 DIAGNOSIS — R460 Very low level of personal hygiene: Secondary | ICD-10-CM

## 2014-12-06 DIAGNOSIS — R279 Unspecified lack of coordination: Secondary | ICD-10-CM

## 2014-12-06 NOTE — Therapy (Signed)
[place] MAIN [facility] SERVICES [address] [place], [place], [address] Phone: [phone]   Fax:  [phone]  Occupational Therapy Treatment  Patient Details  Name: Lawrence PEETS Sr. MRN: [id number] Date of Birth: 1932-02-20 Referring Provider:  Anabel Bene, MD  Encounter Date: 12/05/2014      OT End of Session - 12/05/14 0854    Visit Number 14   Number of Visits 17   Date for OT Re-Evaluation 12/12/14   OT Start Time 0829   OT Stop Time 0931   OT Time Calculation (min) 62 min   Activity Tolerance Patient tolerated treatment well   Behavior During Therapy [facility] for tasks assessed/performed      Past Medical History  Diagnosis Date  . Hypertension   . Pacemaker   . Hypothyroidism   . Chronic kidney disease     enlarged prostrate  . Depression   . Dysrhythmia   . Arthritis   . Hypothyroid   . Bradycardia   . Vertigo     Past Surgical History  Procedure Laterality Date  . Insert / replace / remove pacemaker      dr Ubaldo Glassing  medtronic     09  . Cardiac catheterization      09  . Appendectomy    . Thumbs      trigger thumbs  . Cataract extraction    . Lumbar laminectomy/decompression microdiscectomy  08/06/2011    Procedure: LUMBAR LAMINECTOMY/DECOMPRESSION MICRODISCECTOMY 2 LEVELS;  Surgeon: Eustace Moore, MD;  Location: [place] NEUR[facility] ORS;  Service: Neurosurgery;  Laterality: N/A;  Lumbar two three, lumbar three four decompressive laminectomy  . Partial hip arthroplasty Right 2014  . Hernia repair  20 years ago    umbiluical   . Hernia repair Left 07-25-13    inguinal  . Shoulder surgery Right   . Knee surgery Left   . Joint replacement      lt knee  . Joint replacement      right hip    There were no vitals filed for this visit.  Visit Diagnosis:  Muscle weakness (generalized)  Lack of coordination  Difficulty walking  Self-care deficit for bathing and hygiene  Self-care deficit for dressing and  grooming  Unsteadiness      Subjective Assessment - 12/05/14 0852    Subjective  Patient reports he was tired yesterday, went home and took a nap. Feels like he has made progress with his exercises and functional mobility since coming to therapy.     Patient is accompained by: Family member   Patient Stated Goals "To get back to normal as possible"   Currently in Pain? Yes   Pain Score 1    Pain Location Hip   Pain Orientation Right   Pain Descriptors / Indicators Dull   Pain Type Chronic pain   Pain Frequency Constant   Multiple Pain Sites No                      OT Treatments/Exercises (OP) - 12/05/14 1459    ADLs   ADL Comments Seen for Functional component tasks as follows: sit to stand, handwriting tasks, turning in small spaces, negotiation of stairs, rolling in bed. Focused this date on large scale letter formation with handwriting. Several trials and then production of printed and scripted name with good results, increased size of letters and legibility with short paragraphs, legibility at 80% this date.   Also focused on  rolling in bed with cues for cues of BIG movements.   Neurological Re-education Exercises   Other Exercises 1 LSVT Daily Session Maximal Daily Exercises: Sustained movements are designed to rescale the amplitude of movement output for generalization to daily functional activities. Performed as follows for 1 set of 10 repetitions each: Multi directional sustained movements- 1) Floor to ceiling, 2) Side to side. Multi directional Repetitive movements performed in standing and are designed to provide retraining effort needed for sustained muscle activation in tasks Performed as follows: 3) Step and reach forward, 4) Step and Reach Backwards, 5) Step and reach sideways, 6) Rock and reach forward/backward, 7) Rock and reach sideways. Each exercise performed with CGA to SBA for balance recovery. Sit to stand from mat table on lowest setting with cues for  weight shift, technique and Supervision for 5 reps for 2 sets. Patient also requires cues for posture and head position during functional mobility.   Other Exercises 2 Functional mobility skills with straight cane, 2 trials of 350 feet each with cues for amplitude of gait and arm swing for left arm. Focused on turns using BIG principles.                OT Education - 12/05/14 [phone]    Education provided Yes   Education Details HEP, posture with sit to stand   Person(s) Educated Patient;Spouse   Methods Explanation;Demonstration;Tactile cues;Verbal cues   Comprehension Verbal cues required;Tactile cues required;Returned demonstration;Verbalized understanding             OT Long Term Goals - 11/27/14 0947    OT LONG TERM GOAL #1   Title Patient will improve gait speed and endurance and be able to walk 600 feet in 6 minutes to negotiate around the home and community safely    Baseline 485 feet at evaluation, 525 at 10th visit   Time 4   Period Weeks   Status On-going   OT LONG TERM GOAL #2   Title Patient will complete HEP for maximal daily exercises with modified independence    Time 4   Period Weeks   Status On-going   OT LONG TERM GOAL #3   Title Patient will transfer from sit to stand without the use of arms safely and independently from a variety of chairs/surfaces.   Baseline Moderate difficulty at evaluation and requires 47 secs to complete 5 times sit to stand.   Time 4   Period Weeks   Status On-going   OT LONG TERM GOAL #4   Title Patient will demo increased size of print with hand writing and legibility of greater than 80% to fill out important papers.   Baseline decreased legibility to 25% with scripted handwriting at evaluation, moderate micrographia, improved at 10th visit with cues and lined paper   Time 4   Period Weeks   Status On-going   OT LONG TERM GOAL #5   Title Patient will decrease freezing of gait behaviors on a daily basis and reduce score  on Freezing of Gait Questionairre to 12 or less to decrease risk for falls.    Baseline Patient scored 15 on FOG at Eval, score of 13 at 10th visit.   Time 4   Period Weeks   Status On-going               Plan - 12/05/14 0855    Clinical Impression Statement Patient has continued to make excellent progress with all areas of LSVT BIG program.  He missed  2 appointments during the treatment course and does not feel he wants to extend and make up those 2 sessions.  His wife assists with exercises at home and he feels he has a good foundational understanding of them.  Will plan to discharge next session, and will perform outcome measures and update goals.  Will make additional recommendations next session for plan after discharge.   Pt will benefit from skilled therapeutic intervention in order to improve on the following deficits (Retired) Abnormal gait;Decreased knowledge of use of DME;Decreased strength;Impaired flexibility;Decreased balance;Decreased mobility;Difficulty walking;Decreased range of motion;Pain;Decreased coordination;Impaired UE functional use   Rehab Potential Good   OT Frequency 4x / week   OT Duration 4 weeks   OT Treatment/Interventions Self-care/ADL training;Therapeutic exercise;Functional Mobility Training;Patient/family education;Neuromuscular education;Balance training;DME and/or AE instruction;Gait Training;Stair Training   OT Home Exercise Plan LSVT BIG maximal daily exercises, handwriting   Consulted and Agree with Plan of Care Family member/caregiver;Patient   Family Member Consulted wife        Problem List Patient Active Problem List   Diagnosis Date Noted  . Chest wall pain 11/29/2014  . Hypertension 10/11/2014  . Hyperlipidemia 10/11/2014  . Idiopathic Parkinson's disease 05/02/2014  . Leg pain, right 05/02/2014  . Back pain, chronic 12/28/2013  . Clinical depression 08/29/2013  . Has a tremor 08/29/2013  . Mechanical and motor problems with  internal organs 08/29/2013  . Disordered sleep 08/29/2013  . Amnesia 08/29/2013  . Fatigue 08/29/2013  . Fall 08/29/2013  . Dizziness 08/29/2013  . Difficulty in walking 08/29/2013  . Adult hypothyroidism 06/16/2013  . Acquired trigger finger 06/16/2013  . Arrhythmia, sinus node 06/16/2013  . HLD (hyperlipidemia) 06/16/2013  . Benign essential HTN 06/16/2013   Lawrence Valdez, OTR/L, CLT  Lawrence Valdez,Lawrence Valdez 12/06/2014, 3:03 PM  [place] MAIN [facility] SERVICES [address] [place], [place], [address] Phone: [phone]   Fax:  [phone]

## 2014-12-07 NOTE — Therapy (Signed)
[place] MAIN [facility] SERVICES [address] [place], [place], [address] Phone: [phone]   Fax:  [phone]  Occupational Therapy Treatment/Discharge Summary  Patient Details  Name: Lawrence SAMBRANO Sr. MRN: [id number] Date of Birth: June 08, 1931 Referring Mayfield Schoene:  Anabel Bene, MD  Encounter Date: 12/06/2014      OT End of Session - 12/06/14 1504    Visit Number 15   Number of Visits 17   Date for OT Re-Evaluation 12/12/14   OT Start Time 0830   OT Stop Time 0930   OT Time Calculation (min) 60 min   Activity Tolerance Patient tolerated treatment well   Behavior During Therapy [facility] for tasks assessed/performed      Past Medical History  Diagnosis Date  . Hypertension   . Pacemaker   . Hypothyroidism   . Chronic kidney disease     enlarged prostrate  . Depression   . Dysrhythmia   . Arthritis   . Hypothyroid   . Bradycardia   . Vertigo     Past Surgical History  Procedure Laterality Date  . Insert / replace / remove pacemaker      dr Ubaldo Glassing  medtronic     09  . Cardiac catheterization      09  . Appendectomy    . Thumbs      trigger thumbs  . Cataract extraction    . Lumbar laminectomy/decompression microdiscectomy  08/06/2011    Procedure: LUMBAR LAMINECTOMY/DECOMPRESSION MICRODISCECTOMY 2 LEVELS;  Surgeon: Eustace Moore, MD;  Location: [place] [facility];  Service: Neurosurgery;  Laterality: N/A;  Lumbar two three, lumbar three four decompressive laminectomy  . Partial hip arthroplasty Right 2014  . Hernia repair  20 years ago    umbiluical   . Hernia repair Left 07-25-13    inguinal  . Shoulder surgery Right   . Knee surgery Left   . Joint replacement      lt knee  . Joint replacement      right hip    There were no vitals filed for this visit.  Visit Diagnosis:  Muscle weakness (generalized)  Lack of coordination  Difficulty walking  Self-care deficit for bathing and hygiene  Self-care deficit for  dressing and grooming  Unsteadiness      Subjective Assessment - 12/06/14 1459    Subjective  Patient reports he feels he will be able to continue with exercises at home with the help of his wife.  He feels he has made really good progress and is glad he came for therapy.     Patient is accompained by: Family member   Patient Stated Goals "To get back to normal as possible"   Pain Location Hip   Pain Orientation Right   Pain Descriptors / Indicators Dull   Pain Type Chronic pain                      OT Treatments/Exercises (OP) - 12/06/14 1500    ADLs   ADL Comments Reviewed functional  component tasks and progress in each area.  Patient able to demonstrate each task successfully this date without assist.    Neurological Re-education Exercises   Other Exercises 1 LSVT Daily Session Maximal Daily Exercises: Sustained movements are designed to rescale the amplitude of movement output for generalization to daily functional activities. Performed as follows for 1 set of 10 repetitions each: Multi directional sustained movements- 1) Floor to ceiling, 2) Side to side.  Multi directional Repetitive movements performed in standing and are designed to provide retraining effort needed for sustained muscle activation in tasks Performed as follows: 3) Step and reach forward, 4) Step and Reach Backwards, 5) Step and reach sideways, 6) Rock and reach forward/backward, 7) Rock and reach sideways. Each exercise performed with CGA to SBA for balance recovery. Sit to stand from mat table on lowest setting with cues for weight shift, technique and Supervision for 5 reps for 2 sets. Patient also requires cues for posture and head position during functional mobility.   Other Exercises 2 6 minute walk test 770 feet, 5 times sit to stand 12 secs.  , Right grip strength 65#, Left 48 #..  9 hole peg test right 22 sec, left 34 secs.  Freezing of Gait questionairre 8.                OT Education -  12/06/14 1504    Education provided Yes   Education Details HEP with further instructions on grading exercises as he progresses at home.     Person(s) Educated Patient;Spouse   Methods Explanation;Demonstration;Verbal cues   Comprehension Verbal cues required;Returned demonstration;Verbalized understanding             OT Long Term Goals - 12/06/14 1512    OT LONG TERM GOAL #1   Title Patient will improve gait speed and endurance and be able to walk 600 feet in 6 minutes to negotiate around the home and community safely    Baseline 485 feet at evaluation, 525 at 10th visit, 770 feet at discharge   Time 4   Period Weeks   Status Achieved   OT LONG TERM GOAL #2   Title Patient will complete HEP for maximal daily exercises with modified independence    Time 4   Period Weeks   Status Achieved   OT LONG TERM GOAL #3   Title Patient will transfer from sit to stand without the use of arms safely and independently from a variety of chairs/surfaces.   Baseline completed in 12 secs at discharge (47 secs at eval)   Time 4   Period Weeks   Status Achieved   OT LONG TERM GOAL #4   Title Patient will demo increased size of print with hand writing and legibility of greater than 80% to fill out important papers.   Time 4   Period Weeks   Status Achieved   OT LONG TERM GOAL #5   Title Patient will decrease freezing of gait behaviors on a daily basis and reduce score on Freezing of Gait Questionairre to 12 or less to decrease risk for falls.    Baseline Patient scored 15 on FOG at Eval, score of 13 at 10th visit, score of 8 at discharge   Time 4   Period Weeks   Status Achieved               Plan - 12/06/14 1505    Clinical Impression Statement Paient reassessed this date and has made excellent progress with participation in [place] program.  He improved his functional mobility with 6 minute walk test from 485 feet to 770 feet.  His 5 times sit to stand went from 47 secs to 12  sec.  He is able to demo his HEP with use of handouts and has his wife to help provide cues as needed.  She attended every session and demonstrates independence in exercises.  Will recommend discharge from OT at this time  with goals met.  Patient will continue with exercises daily, minimum of 1 time a day and continue with participation in functional mobility tasks.  Encouraged patient to also focus on handwriting utilizing BIG principles.     Pt will benefit from skilled therapeutic intervention in order to improve on the following deficits (Retired) Abnormal gait;Decreased knowledge of use of DME;Decreased strength;Impaired flexibility;Decreased balance;Decreased mobility;Difficulty walking;Decreased range of motion;Pain;Decreased coordination;Impaired UE functional use   Rehab Potential Good   OT Frequency 4x / week   OT Duration 4 weeks   OT Treatment/Interventions Self-care/ADL training;Therapeutic exercise;Functional Mobility Training;Patient/family education;Neuromuscular education;Balance training;DME and/or AE instruction;Gait Training;Stair Training   OT Home Exercise Plan LSVT BIG maximal daily exercises, handwriting   Consulted and Agree with Plan of Care Family member/caregiver;Patient   Family Member Consulted wife          G-Codes - Dec 11, 2014 1514    Functional Assessment Tool Used 6 minute walk test, 5 times sit to stand, clnical judgement   Functional Limitation Mobility: Walking and moving around   Mobility: Walking and Moving Around Goal Status [phone]) At least 40 percent but less than 60 percent impaired, limited or restricted   Mobility: Walking and Moving Around Discharge Status [id number]) At least 20 percent but less than 40 percent impaired, limited or restricted      Problem List Patient Active Problem List   Diagnosis Date Noted  . Chest wall pain 11/29/2014  . Hypertension 10/11/2014  . Hyperlipidemia 10/11/2014  . Idiopathic Parkinson's disease 05/02/2014  . Leg  pain, right 05/02/2014  . Back pain, chronic 12/28/2013  . Clinical depression 08/29/2013  . Has a tremor 08/29/2013  . Mechanical and motor problems with internal organs 08/29/2013  . Disordered sleep 08/29/2013  . Amnesia 08/29/2013  . Fatigue 08/29/2013  . Fall 08/29/2013  . Dizziness 08/29/2013  . Difficulty in walking 08/29/2013  . Adult hypothyroidism 06/16/2013  . Acquired trigger finger 06/16/2013  . Arrhythmia, sinus node 06/16/2013  . HLD (hyperlipidemia) 06/16/2013  . Benign essential HTN 06/16/2013   GEAROLD WAINER, OTR/L, CLT [place],Amy 12/07/2014, 3:16 PM  [place] MAIN [facility] SERVICES [address] [place], [place], [address] Phone: [phone]   Fax:  [phone]

## 2014-12-10 ENCOUNTER — Ambulatory Visit: Payer: PPO | Admitting: Occupational Therapy

## 2014-12-12 ENCOUNTER — Telehealth: Payer: Self-pay | Admitting: Pain Medicine

## 2014-12-12 NOTE — Telephone Encounter (Signed)
Pt already stopping asa for another procedure in October. Pre procedure instructios given

## 2014-12-12 NOTE — Telephone Encounter (Signed)
Nurses, Caryl Pina, and [place], Please call patient and schedule patient for procedure on Wednesday, 12/19/2014 We will plan to perform lumbar facet or lumbar epidural injection pending evaluation of patient Nurses, please call patient and described pain to me also check for aspirin and similar medications which will need to be discontinued prior to the procedure pending patient's physician will allow discontinuation of the anticoagulant.

## 2014-12-12 NOTE — Telephone Encounter (Signed)
Pt having trouble with his back and would like to get an injection

## 2014-12-12 NOTE — Telephone Encounter (Signed)
Pain in right lower back, down right hip and right leg. Last procedure helped the pain, and would like another.

## 2014-12-18 ENCOUNTER — Other Ambulatory Visit: Payer: Self-pay | Admitting: Pain Medicine

## 2014-12-18 DIAGNOSIS — M47816 Spondylosis without myelopathy or radiculopathy, lumbar region: Secondary | ICD-10-CM

## 2014-12-18 DIAGNOSIS — M533 Sacrococcygeal disorders, not elsewhere classified: Secondary | ICD-10-CM

## 2014-12-18 DIAGNOSIS — M48062 Spinal stenosis, lumbar region with neurogenic claudication: Secondary | ICD-10-CM

## 2014-12-18 DIAGNOSIS — M5136 Other intervertebral disc degeneration, lumbar region: Secondary | ICD-10-CM

## 2014-12-19 ENCOUNTER — Ambulatory Visit: Payer: PPO | Attending: Pain Medicine | Admitting: Pain Medicine

## 2014-12-19 VITALS — BP 126/70 | HR 59 | Temp 98.3°F | Resp 18 | Ht 67.5 in | Wt 196.0 lb

## 2014-12-19 DIAGNOSIS — M5136 Other intervertebral disc degeneration, lumbar region: Secondary | ICD-10-CM

## 2014-12-19 DIAGNOSIS — M48061 Spinal stenosis, lumbar region without neurogenic claudication: Secondary | ICD-10-CM

## 2014-12-19 DIAGNOSIS — M5416 Radiculopathy, lumbar region: Secondary | ICD-10-CM

## 2014-12-19 DIAGNOSIS — M79605 Pain in left leg: Secondary | ICD-10-CM | POA: Diagnosis present

## 2014-12-19 DIAGNOSIS — M5126 Other intervertebral disc displacement, lumbar region: Secondary | ICD-10-CM | POA: Diagnosis not present

## 2014-12-19 DIAGNOSIS — M51369 Other intervertebral disc degeneration, lumbar region without mention of lumbar back pain or lower extremity pain: Secondary | ICD-10-CM

## 2014-12-19 DIAGNOSIS — M79604 Pain in right leg: Secondary | ICD-10-CM | POA: Diagnosis present

## 2014-12-19 DIAGNOSIS — M545 Low back pain: Secondary | ICD-10-CM | POA: Diagnosis present

## 2014-12-19 DIAGNOSIS — M47816 Spondylosis without myelopathy or radiculopathy, lumbar region: Secondary | ICD-10-CM | POA: Diagnosis not present

## 2014-12-19 DIAGNOSIS — M4806 Spinal stenosis, lumbar region: Secondary | ICD-10-CM | POA: Diagnosis not present

## 2014-12-19 MED ORDER — TRIAMCINOLONE ACETONIDE 40 MG/ML IJ SUSP
INTRAMUSCULAR | Status: AC
  Administered 2014-12-19: 09:00:00
  Filled 2014-12-19: qty 1

## 2014-12-19 MED ORDER — FENTANYL CITRATE (PF) 100 MCG/2ML IJ SOLN
INTRAMUSCULAR | Status: AC
  Administered 2014-12-19: 50 ug via INTRAVENOUS
  Filled 2014-12-19: qty 2

## 2014-12-19 MED ORDER — MIDAZOLAM HCL 5 MG/5ML IJ SOLN
INTRAMUSCULAR | Status: AC
  Administered 2014-12-19: 1 mg via INTRAVENOUS
  Filled 2014-12-19: qty 5

## 2014-12-19 MED ORDER — ORPHENADRINE CITRATE 30 MG/ML IJ SOLN
INTRAMUSCULAR | Status: AC
  Administered 2014-12-19: 09:00:00
  Filled 2014-12-19: qty 2

## 2014-12-19 MED ORDER — CEFAZOLIN SODIUM 1 G IJ SOLR
INTRAMUSCULAR | Status: AC
  Administered 2014-12-19: 1 g via INTRAVENOUS
  Filled 2014-12-19: qty 10

## 2014-12-19 MED ORDER — BUPIVACAINE HCL (PF) 0.25 % IJ SOLN
INTRAMUSCULAR | Status: AC
  Administered 2014-12-19: 09:00:00
  Filled 2014-12-19: qty 30

## 2014-12-19 MED ORDER — CEFUROXIME AXETIL 250 MG PO TABS: 250.0000 mg | ORAL_TABLET | Freq: Two times a day (BID) | ORAL | Status: DC

## 2014-12-19 NOTE — Progress Notes (Signed)
Safety precautions to be maintained throughout the outpatient stay will include: orient to surroundings, keep bed in low position, maintain call bell within reach at all times, provide assistance with transfer out of bed and ambulation.  

## 2014-12-19 NOTE — Progress Notes (Signed)
Subjective:    Patient ID: Lawrence Coffee Sr., male    DOB: [birth year], 81 y.o.   MRN: [id number]  HPI  PROCEDURE PERFORMED: Lumbosacral selective nerve root block   NOTE: The patient is a 81 y.o. male who returns to [place] for further evaluation and treatment of pain involving the lumbar and lower extremity region. Studies consisting of MRI has revealed the patient to be with evidence of prior posterior decompression L2-3 and L3 for relieving the central stenosis seen on prior exam. Foramina at both levels appear with little change with mild to moderate stenosis. Patient L1 to degenerative disc disease with mild central stenosis associated with broad-based disc bulging and facet hypertrophy. Retrolisthesis L1 on L2, L2 on L3, and L3 on L4, all grade 1. L4-5 and L5-S1 levels with moderate foraminal stenosis and moderate bilateral foraminal stenosis. There is concern regarding radicular component patient's pain with concern regarding intraspinal abnormalities contributing to the patient's symptomatology. The risks, benefits, and expectations of the procedure have been explained to the patient who was understanding and in agreement with suggested treatment plan. We will proceed with interventional treatment as discussed and as explained to the patient. The patient is understanding and in agreement with suggested treatment plan.   DESCRIPTION OF PROCEDURE: Lumbosacral selective nerve root block with IV Versed, IV fentanyl conscious sedation, EKG, blood pressure, pulse, and pulse oximetry monitoring. The procedure was performed with the patient in the prone position under fluoroscopic guidance. With the patient in the prone position, Betadine prep of proposed entry site was performed. Local anesthetic skin wheal of proposed needle entry site was prepared with 1.5% plain lidocaine with AP view of the lumbosacral spine.   PROCEDURE #1: Needle placement at the right L 2 vertebral body: A 22  -gauge needle was inserted at the inferior border of the transverse process of the vertebral body with needle placed medial to the midline of the transverse process on AP view of the lumbosacral spine.   NEEDLE PLACEMENT AT  L3, L4, and L5  VERTEBRAL BODY LEVELS  Needle  placement was accomplished at L3, L4, and L5  vertebral body levels on the right side exactly as was accomplished at the L2  vertebral body level  and utilizing the same technique and under fluoroscopic guidance.    Needle placement was then verified on lateral view at all levels with needle tip documented to be in the posterior superior quadrant of the intervertebral foramen of  L 2, L3, L4, and L5. Following negative aspiration for heme and CSF at each level, each level was injected with 3 mL of 0.25% bupivacaine with Kenalog.   The patient tolerated the procedure well. A total of 10 mg of Kenalog was utilized for the procedure.   PLAN:  1. Medications: Will continue presently prescribed medications. 2. The patient is to undergo follow-up evaluation with PCP Dr.Shah   for evaluation of blood pressure and general medical condition status post procedure performed on today's visit. 3. Surgical follow-up evaluation.. We have addressed surgical evaluation and may consider pending follow-up evaluation  4. Neurological evaluation.Period May consider PNCV EMG studies and other studies  5. May consider radiofrequency procedures, implantation type procedures and other treatment pending response to treatment and follow-up evaluation. 6. The patient has been advise do adhere to proper body mechanics and avoid activities which may aggravate condition. 7. The patient has been advised to call the Pain Management Center prior to scheduled return appointment should there be  significant change in the patient's condition or should the patient have other concerns regarding condition prior to scheduled return appointment.     Review of  Systems     Objective:   Physical Exam        Assessment & Plan:

## 2014-12-19 NOTE — Patient Instructions (Addendum)
PLAN  Continue present medication and begin taking antibiotic Ceftin as prescribed. Please obtain your antibiotic Ceftin today and begin taking antibiotic today  F/U PCP Dr.Shah  for evaliation of  BP and general medical  condition.  F/U surgical evaluation. May consider pending follow-up evaluations  F/U neurological evaluation. May consider pending follow-up evaluations  May consider radiofrequency rhizolysis or intraspinal procedures pending response to present treatment and F/U evaluation.  Patient to call Pain Management Center should patient have concerns prior to scheduled return appointment.  Pain Management Discharge Instructions  General Discharge Instructions :  If you need to reach your doctor call: Monday-Friday 8:00 am - 4:00 pm at [phone] or toll free [phone].  After clinic hours [phone] to have operator reach doctor.  Bring all of your medication bottles to all your appointments in the pain clinic.  To cancel or reschedule your appointment with Pain Management please remember to call 24 hours in advance to avoid a fee.  Refer to the educational materials which you have been given on: General Risks, I had my Procedure. Discharge Instructions, Post Sedation.  Post Procedure Instructions:  The drugs you were given will stay in your system until tomorrow, so for the next 24 hours you should not drive, make any legal decisions or drink any alcoholic beverages.  You may eat anything you prefer, but it is better to start with liquids then soups and crackers, and gradually work up to solid foods.  Please notify your doctor immediately if you have any unusual bleeding, trouble breathing or pain that is not related to your normal pain.  Depending on the type of procedure that was done, some parts of your body may feel week and/or numb.  This usually clears up by tonight or the next day.  Walk with the use of an assistive device or accompanied by an adult for  the 24 hours.  You may use ice on the affected area for the first 24 hours.  Put ice in a Ziploc bag and cover with a towel and place against area 15 minutes on 15 minutes off.  You may switch to heat after 24 hours.Selective Nerve Root Block Patient Information  Description: Specific nerve roots exit the spinal canal and these nerves can be compressed and inflamed by a bulging disc and bone spurs.  By injecting steroids on the nerve root, we can potentially decrease the inflammation surrounding these nerves, which often leads to decreased pain.  Also, by injecting local anesthesia on the nerve root, this can provide [place] helpful information to give to your referring doctor if it decreases your pain.  Selective nerve root blocks can be done along the spine from the neck to the low back depending on the location of your pain.   After numbing the skin with local anesthesia, a small needle is passed to the nerve root and the position of the needle is verified using x-ray pictures.  After the needle is in correct position, we then deposit the medication.  You may experience a pressure sensation while this is being done.  The entire block usually lasts less than 15 minutes.  Conditions that may be treated with selective nerve root blocks:  Low back and leg pain  Spinal stenosis  Diagnostic block prior to potential surgery  Neck and arm pain  Post laminectomy syndrome  Preparation for the injection:  1. Do not eat any solid food or dairy products within 6 hours of your appointment. 2. You may drink clear  liquids up to 2 hours before an appointment.  Clear liquids include water, black coffee, juice or soda.  No milk or cream please. 3. You may take your regular medications, including pain medications, with a sip of water before your appointment.  Diabetics should hold regular insulin (if taken separately) and take 1/2 normal NPH dose the morning of the procedure.  Carry some sugar containing items  with you to your appointment. 4. A driver must accompany you and be prepared to drive you home after your procedure. 5. Bring all your current medications with you. 6. An IV may be inserted and sedation may be given at the discretion of the physician. 7. A blood pressure cuff, EKG, and other monitors will often be applied during the procedure.  Some patients may need to have extra oxygen administered for a short period. 8. You will be asked to provide medical information, including allergies, prior to the procedure.  We must know immediately if you are taking blood  Thinners (like Coumadin) or if you are allergic to IV iodine contrast (dye).  Possible side-effects: All are usually temporary  Bleeding from needle site  Light headedness  Numbness and tingling  Decreased blood pressure  Weakness in arms/legs  Pressure sensation in back/neck  Pain at injection site (several days)  Possible complications: All are extremely rare  Infection  Nerve injury  Spinal headache (a headache wore with upright position)  Call if you experience:  Fever/chills associated with headache or increased back/neck pain  Headache worsened by an upright position  New onset weakness or numbness of an extremity below the injection site  Hives or difficulty breathing (go to the emergency room)  Inflammation or drainage at the injection site(s)  Severe back/neck pain greater than usual  New symptoms which are concerning to you  Please note:  Although the local anesthetic injected can often make your back or neck feel good for several hours after the injection the pain will likely return.  It takes 3-5 days for steroids to work on the nerve root. You may not notice any pain relief for at least one week.  If effective, we will often do a series of 3 injections spaced 3-6 weeks apart to maximally decrease your pain.    If you have any questions, please call [phone] [facility]

## 2014-12-20 ENCOUNTER — Telehealth: Payer: Self-pay | Admitting: *Deleted

## 2014-12-20 ENCOUNTER — Telehealth: Payer: Self-pay | Admitting: Urology

## 2014-12-20 NOTE — Telephone Encounter (Signed)
Per Dr. Raul Del office patient was given clearance to stop ASA 7days prior to circumcision. Spoke with patient and his wife and they stated patient stopped ASA on 12-16-14 and will continue until after Circumcision on 12-25-14/SW

## 2014-12-20 NOTE — Telephone Encounter (Signed)
Patient denies complications post procedure. 

## 2014-12-25 ENCOUNTER — Ambulatory Visit (INDEPENDENT_AMBULATORY_CARE_PROVIDER_SITE_OTHER): Payer: PPO | Admitting: Urology

## 2014-12-25 VITALS — BP 153/87 | HR 69 | Ht 67.0 in | Wt 196.0 lb

## 2014-12-25 DIAGNOSIS — N471 Phimosis: Secondary | ICD-10-CM

## 2014-12-25 NOTE — Progress Notes (Signed)
   Circumcision Information and Post Care Instructions  Preparation: Pubic Hair There is no need to completely shave your pubic hair but it is desirable to trim it fairly short. Trim your pubic hair a few days in advance of the operation to allow time for the cut ends to soften again. Hygiene On the morning of the circumcision ensure that you take a good bath or shower and pay particular attention to your genitals. Retract your foreskin as far as you can and clean well under it. Immediately before the time of the procedure empty your bowels and bladder.   After-care: After the procedure your whole penis will be swollen and look very bruised. This is a normal effect of both the injected anaesthetic and the handling it necessarily receives during the operation. These will gradually reduce over the next week or two. Underwear If you normally wear boxers you may find that they give insufficient support immediately post procedure. You may wish to consider some form of briefs which will hold your penis in position to provide support and reduce the friction The Bandage The bandage will normally be wound tightly around the penis. Leave for 48hrs and keep clean and dry.    Promoting Healing Do not apply any antiseptic cream to your penis, nor add any antiseptic to bath water. Though they do help to kill germs, most are corrosive to new skin and actually slow down healing. In the rare cases where an infection develops, see a doctor as soon as possible. Smoking can delay healing and place you at higher risk of infection. You should quit or significantly reduce the amount of cigarettes you smoke prior to and after procedure.  Pain Killers Everyone reacts differently in respect of pain. For most people circumcision will not be truly painful, but a degree of discomfort is to be expected during the first few days. If you choose to take pain killing tablets like Tylenol then follow the instructions precisely.  Do not take more than the recommended maximum dose. Do not take Aspirin or any Aspirin based product since these thin the blood and have an anti-clotting action which can increase bleeding from a wound.  The Stitches Stitches need to remain in place long enough for the cut edges to knit together but not so long as to allow the skin around them to fully heal. In practice this usually means they should remain for between 1 and 2 weeks. Although the doctor will normally use soluble (or self-dissolving) stitches and will dissolve/ fall out on their own.    Time off School or Work There is no absolute need to take time off school or work after circumcision, but you may find it very hard to concentrate on work for the first few days and so may find it useful to take a week off. A week (or even two) off work is very desirable if you do heavy lifting or if your job keeps you seated and unable to move around freely for long periods. You should naturally avoid energetic or contact sports, sexual activity, cycling and swimming until your circumcision has fully healed.      

## 2014-12-25 NOTE — Progress Notes (Signed)
Preoperative diagnosis: Phimosis Postoperative diagnosis: Same  Procedure: Circumcision Surgeon: Lillette Boxer. Dahlstedt, MD. Anesthesia: Gen. with penile block Specimen: foreskin Complications: none [id number]  Indications: The patient was recently evaluated for phimosis. All risks and benefits of circumcision discussed. Full informed consent obtained. The patient now presents for definitive procedure.  Technique and findings: The patient was brought to the operating room. Successful  Local circumferential block with 1% Xylocaine The patient was then prepped and draped in usual manner. Appropriate surgical timeout was performed. A penile block was then performed with right a been on same with appreciation as part of the whole penile all 1 I ran a a I milligrams a oriented diarrhea has a abdomen desires a night but he 9 imagingcc of quarter percent plain Marcaine. Proximal and distal incision sites were marked with a pen, and appropriate circumferential incisions were created. The sleeve of redundant skin was removed with the bovie. Hemostatis was achieved using electrocautery.Quadrant sutures were placed with interrupted 4-0 chromic, with the phrenular suture being a "U" stitch. In between, the same 4-0 chromic was used to re approximate the skin edges with a running simple stitch.  The incision was wrapped with Xeroform gauze, a plain guaze wrap and Coban dressing. The patient was brought to recovery room in stable condition having had no obvious complications or problems. Sponge and needle counts were correct.

## 2015-01-08 ENCOUNTER — Ambulatory Visit (INDEPENDENT_AMBULATORY_CARE_PROVIDER_SITE_OTHER): Payer: PPO | Admitting: Urology

## 2015-01-08 ENCOUNTER — Encounter: Payer: Self-pay | Admitting: Urology

## 2015-01-08 VITALS — BP 110/71 | HR 74 | Ht 68.0 in | Wt 191.1 lb

## 2015-01-08 DIAGNOSIS — N471 Phimosis: Secondary | ICD-10-CM

## 2015-01-08 DIAGNOSIS — N400 Enlarged prostate without lower urinary tract symptoms: Secondary | ICD-10-CM

## 2015-01-08 NOTE — Progress Notes (Signed)
01/08/2015 10:42 AM   Lawrence Coffee Sr. [birth year] [id number]  Referring provider: Roselee Nova, MD [address] [place], [place] [address]  Chief Complaint  Patient presents with  . Post Circumcision    HPI:  1 - Phimosis - s/p office circ 12/2014 for progressive and symptomatic phimosis.  2 - Enlarged Prostate with Lower Urinary Tract Symptoms - s/p prior KTP and on tamsulosin and finasteride with good functional result.   Today [name] is seen for wound check. No interval wound problems or fevers.   PMH: Past Medical History  Diagnosis Date  . Hypertension   . Pacemaker   . Hypothyroidism   . Chronic kidney disease     enlarged prostrate  . Depression   . Dysrhythmia   . Arthritis   . Hypothyroid   . Bradycardia   . Vertigo     Surgical History: Past Surgical History  Procedure Laterality Date  . Insert / replace / remove pacemaker      dr Lawrence Valdez  medtronic     09  . Cardiac catheterization      09  . Appendectomy    . Thumbs      trigger thumbs  . Cataract extraction    . Lumbar laminectomy/decompression microdiscectomy  08/06/2011    Procedure: LUMBAR LAMINECTOMY/DECOMPRESSION MICRODISCECTOMY 2 LEVELS;  Surgeon: Lawrence Moore, MD;  Location: [facility];  Service: Neurosurgery;  Laterality: N/A;  Lumbar two three, lumbar three four decompressive laminectomy  . Partial hip arthroplasty Right 2014  . Hernia repair  20 years ago    umbiluical   . Hernia repair Left 07-25-13    inguinal  . Shoulder surgery Right   . Knee surgery Left   . Joint replacement      lt knee  . Joint replacement      right hip    Home Medications:    Medication List       This list is accurate as of: 01/08/15 10:42 AM.  Always use your most recent med list.               aspirin EC 325 MG tablet  Take 325 mg by mouth daily.     carbidopa-levodopa 50-200 MG tablet  Commonly known as:  SINEMET CR  Take 1 tablet by mouth 2 (two) times  daily.     cefUROXime 250 MG tablet  Commonly known as:  CEFTIN  Take 1 tablet (250 mg total) by mouth 2 (two) times daily with a meal.     cholecalciferol 1000 UNITS tablet  Commonly known as:  VITAMIN D  Take 4,000 Units by mouth daily.     citalopram 10 MG tablet  Commonly known as:  CELEXA  Take 10 mg by mouth daily.     finasteride 5 MG tablet  Commonly known as:  PROSCAR  Take 5 mg by mouth at bedtime.     Fish Oil 500 MG Caps  Take 2 capsules by mouth daily.     fluticasone 50 MCG/ACT nasal spray  Commonly known as:  FLONASE  Place 2 sprays into both nostrils daily.     levothyroxine 75 MCG tablet  Commonly known as:  SYNTHROID, LEVOTHROID  Take 1 tablet (75 mcg total) by mouth daily before breakfast.     lisinopril 10 MG tablet  Commonly known as:  PRINIVIL,ZESTRIL  Take 1 tablet (10 mg total) by mouth daily.     loratadine 10 MG  tablet  Commonly known as:  CLARITIN  Take 1 tablet (10 mg total) by mouth daily.     meclizine 25 MG tablet  Commonly known as:  ANTIVERT  Take 25 mg by mouth 3 (three) times daily as needed. For dizziness     Melatonin 5 MG Tabs  Take 1 tablet by mouth at bedtime.     metoprolol succinate 25 MG 24 hr tablet  Commonly known as:  TOPROL-XL  TAKE 1 TABLET BY MOUTH EVERY DAY     multivitamin with minerals Tabs tablet  Take 1 tablet by mouth daily.     polyethylene glycol packet  Commonly known as:  MIRALAX / GLYCOLAX  Take 17 g by mouth at bedtime.     pravastatin 20 MG tablet  Commonly known as:  PRAVACHOL  Take 1 tablet (20 mg total) by mouth daily.     tamsulosin 0.4 MG Caps capsule  Commonly known as:  FLOMAX  Take 0.4 mg by mouth daily.     vitamin E 1000 UNIT capsule  Take 1,000 Units by mouth daily.        Allergies:  Allergies  Allergen Reactions  . No Known Allergies   . Adhesive [Tape] Itching and Other (See Comments)    blisters    Family History: Family History  Problem Relation Age of Onset  .  Prostate cancer Brother     Social History:  reports that he quit smoking about 53 years ago. He has never used smokeless tobacco. He reports that he does not drink alcohol or use illicit drugs.    Review of Systems  Gastrointestinal (upper)  : Negative for upper GI symptoms  Gastrointestinal (lower) : Negative for lower GI symptoms  Constitutional : Negative for symptoms  Skin: Negative for skin symptoms  Eyes: Negative for eye symptoms  Ear/Nose/Throat : Negative for Ear/Nose/Throat symptoms  Hematologic/Lymphatic: Negative for Hematologic/Lymphatic symptoms  Cardiovascular : Negative for cardiovascular symptoms  Respiratory : Negative for respiratory symptoms  Endocrine: Negative for endocrine symptoms  Musculoskeletal: Negative for musculoskeletal symptoms  Neurological: Negative for neurological symptoms  Psychologic: Negative for psychiatric symptoms    Physical Exam: BP 110/71 mmHg  Pulse 74  Ht 5\' 8"  (1.727 m)  Wt 191 lb 1.6 oz (86.682 kg)  BMI 29.06 kg/m2  Constitutional:  Alert and oriented, No acute distress. HEENT: [place] AT, moist mucus membranes.  Trachea midline, no masses. Cardiovascular: No clubbing, cyanosis, or edema. Respiratory: Normal respiratory effort, no increased work of breathing. GI: Abdomen is soft, nontender, nondistended, no abdominal masses GU: No CVA tenderness. Phallus straight. Recent circ incision c/d/i with continued resolution of absorbable chromic suture. Good cosmesis.  Skin: No rashes, bruises or suspicious lesions. Lymph: No cervical or inguinal adenopathy. Neurologic: Grossly intact, no focal deficits, moving all 4 extremities. Psychiatric: Normal mood and affect.  Laboratory Data: Lab Results  Component Value Date   WBC 6.2 06/19/2014   HGB 14.0 06/19/2014   HCT 41.2 06/19/2014   MCV 91 06/19/2014   PLT 143* 06/19/2014    Lab Results  Component Value Date   CREATININE 1.02 10/11/2014    No  results found for: PSA  No results found for: TESTOSTERONE  No results found for: HGBA1C  Urinalysis    Component Value Date/Time   COLORURINE Yellow 06/19/2014 1920   APPEARANCEUR Clear 06/19/2014 1920   LABSPEC 1.012 06/19/2014 1920   PHURINE 5.0 06/19/2014 1920   GLUCOSEU Negative 06/19/2014 1920   HGBUR Negative 06/19/2014 1920  BILIRUBINUR Negative 06/19/2014 1920   KETONESUR Negative 06/19/2014 1920   PROTEINUR Negative 06/19/2014 1920   NITRITE Negative 06/19/2014 1920   LEUKOCYTESUR Trace 06/19/2014 1920      Assessment & Plan:    1 - Phimosis -healing well s/p circ.   2 - Enlarged Prostate with Lower Urinary Tract Symptoms - continue current regien.  3 - RTC 1 year any provider for annual and med refills.    No Follow-up on file.  Alexis Frock, [facility] [address] [place], [place] [address] [phone]

## 2015-01-09 ENCOUNTER — Encounter: Payer: Self-pay | Admitting: Pain Medicine

## 2015-01-09 ENCOUNTER — Ambulatory Visit: Payer: PPO | Attending: Pain Medicine | Admitting: Pain Medicine

## 2015-01-09 VITALS — BP 129/64 | HR 59 | Resp 14 | Ht 68.0 in | Wt 191.0 lb

## 2015-01-09 DIAGNOSIS — M47816 Spondylosis without myelopathy or radiculopathy, lumbar region: Secondary | ICD-10-CM

## 2015-01-09 DIAGNOSIS — M5136 Other intervertebral disc degeneration, lumbar region: Secondary | ICD-10-CM | POA: Diagnosis not present

## 2015-01-09 DIAGNOSIS — M5416 Radiculopathy, lumbar region: Secondary | ICD-10-CM

## 2015-01-09 DIAGNOSIS — M48061 Spinal stenosis, lumbar region without neurogenic claudication: Secondary | ICD-10-CM

## 2015-01-09 DIAGNOSIS — M4806 Spinal stenosis, lumbar region: Secondary | ICD-10-CM | POA: Insufficient documentation

## 2015-01-09 DIAGNOSIS — M545 Low back pain: Secondary | ICD-10-CM | POA: Diagnosis present

## 2015-01-09 MED ORDER — MIDAZOLAM HCL 5 MG/5ML IJ SOLN
INTRAMUSCULAR | Status: AC
  Administered 2015-01-09: 2 mg via INTRAVENOUS
  Filled 2015-01-09: qty 5

## 2015-01-09 MED ORDER — CEFAZOLIN SODIUM 1-5 GM-% IV SOLN: 1.0000 g | Freq: Once | INTRAVENOUS | Status: DC

## 2015-01-09 MED ORDER — ORPHENADRINE CITRATE 30 MG/ML IJ SOLN: 60.0000 mg | Freq: Once | INTRAMUSCULAR | Status: DC

## 2015-01-09 MED ORDER — BUPIVACAINE HCL (PF) 0.25 % IJ SOLN
INTRAMUSCULAR | Status: AC
  Administered 2015-01-09: 30 mL
  Filled 2015-01-09: qty 30

## 2015-01-09 MED ORDER — FENTANYL CITRATE (PF) 100 MCG/2ML IJ SOLN
INTRAMUSCULAR | Status: AC
  Administered 2015-01-09: 50 ug via INTRAVENOUS
  Filled 2015-01-09: qty 2

## 2015-01-09 MED ORDER — ORPHENADRINE CITRATE 30 MG/ML IJ SOLN
INTRAMUSCULAR | Status: AC
  Administered 2015-01-09: 60 mg
  Filled 2015-01-09: qty 2

## 2015-01-09 MED ORDER — BUPIVACAINE HCL (PF) 0.25 % IJ SOLN: 30.0000 mL | Freq: Once | INTRAMUSCULAR | Status: DC

## 2015-01-09 MED ORDER — TRIAMCINOLONE ACETONIDE 40 MG/ML IJ SUSP: 40.0000 mg | Freq: Once | INTRAMUSCULAR | Status: AC

## 2015-01-09 MED ORDER — MIDAZOLAM HCL 5 MG/5ML IJ SOLN: 5.0000 mg | Freq: Once | INTRAMUSCULAR | Status: DC

## 2015-01-09 MED ORDER — CEFUROXIME AXETIL 250 MG PO TABS: 250.0000 mg | ORAL_TABLET | Freq: Two times a day (BID) | ORAL | Status: DC

## 2015-01-09 MED ORDER — LIDOCAINE HCL (PF) 1 % IJ SOLN: 10.0000 mL | Freq: Once | INTRAMUSCULAR | Status: DC

## 2015-01-09 MED ORDER — LACTATED RINGERS IV SOLN: 1000.0000 mL | INTRAVENOUS | Status: DC

## 2015-01-09 MED ORDER — CEFAZOLIN SODIUM 1 G IJ SOLR
INTRAMUSCULAR | Status: AC
  Administered 2015-01-09: 1 g via INTRAVENOUS
  Filled 2015-01-09: qty 10

## 2015-01-09 MED ORDER — FENTANYL CITRATE (PF) 100 MCG/2ML IJ SOLN: 100.0000 ug | Freq: Once | INTRAMUSCULAR | Status: DC

## 2015-01-09 MED ORDER — TRIAMCINOLONE ACETONIDE 40 MG/ML IJ SUSP
INTRAMUSCULAR | Status: AC
  Administered 2015-01-09: 40 mg
  Filled 2015-01-09: qty 1

## 2015-01-09 NOTE — Patient Instructions (Addendum)
PLAN  Continue present medication and begin taking antibiotic Ceftin as prescribed. Please obtain your antibiotic Ceftin today and begin taking antibiotic today  F/U PCP Dr. Manuella Ghazi for evaliation of  BP and general medical  condition.  F/U surgical evaluation. May consider pending follow-up evaluations  F/U neurological evaluation. May consider pending follow-up evaluations  May consider radiofrequency rhizolysis or intraspinal procedures pending response to present treatment and F/U evaluation.  Patient to call Pain Management Center should patient have concerns prior to scheduled return appointment.  Pain Management Discharge Instructions  General Discharge Instructions :  If you need to reach your doctor call: Monday-Friday 8:00 am - 4:00 pm at [phone] or toll free [phone].  After clinic hours [phone] to have operator reach doctor.  Bring all of your medication bottles to all your appointments in the pain clinic.  To cancel or reschedule your appointment with Pain Management please remember to call 24 hours in advance to avoid a fee.  Refer to the educational materials which you have been given on: General Risks, I had my Procedure. Discharge Instructions, Post Sedation.  Post Procedure Instructions:  The drugs you were given will stay in your system until tomorrow, so for the next 24 hours you should not drive, make any legal decisions or drink any alcoholic beverages.  You may eat anything you prefer, but it is better to start with liquids then soups and crackers, and gradually work up to solid foods.  Please notify your doctor immediately if you have any unusual bleeding, trouble breathing or pain that is not related to your normal pain.  Depending on the type of procedure that was done, some parts of your body may feel week and/or numb.  This usually clears up by tonight or the next day.  Walk with the use of an assistive device or accompanied by an adult for  the 24 hours.  You may use ice on the affected area for the first 24 hours.  Put ice in a Ziploc bag and cover with a towel and place against area 15 minutes on 15 minutes off.  You may switch to heat after 24 hours.GENERAL RISKS AND COMPLICATIONS  What are the risk, side effects and possible complications? Generally speaking, most procedures are safe.  However, with any procedure there are risks, side effects, and the possibility of complications.  The risks and complications are dependent upon the sites that are lesioned, or the type of nerve block to be performed.  The closer the procedure is to the spine, the more serious the risks are.  Great care is taken when placing the radio frequency needles, block needles or lesioning probes, but sometimes complications can occur. 1. Infection: Any time there is an injection through the skin, there is a risk of infection.  This is why sterile conditions are used for these blocks.  There are four possible types of infection. 1. Localized skin infection. 2. Central Nervous System Infection-This can be in the form of Meningitis, which can be deadly. 3. Epidural Infections-This can be in the form of an epidural abscess, which can cause pressure inside of the spine, causing compression of the spinal cord with subsequent paralysis. This would require an emergency surgery to decompress, and there are no guarantees that the patient would recover from the paralysis. 4. Discitis-This is an infection of the intervertebral discs.  It occurs in about 1% of discography procedures.  It is difficult to treat and it may lead to surgery.  2. Pain: the needles have to go through skin and soft tissues, will cause soreness.       3. Damage to internal structures:  The nerves to be lesioned may be near blood vessels or    other nerves which can be potentially damaged.       4. Bleeding: Bleeding is more common if the patient is taking blood thinners such as  aspirin,  Coumadin, Ticiid, Plavix, etc., or if he/she have some genetic predisposition  such as hemophilia. Bleeding into the spinal canal can cause compression of the spinal  cord with subsequent paralysis.  This would require an emergency surgery to  decompress and there are no guarantees that the patient would recover from the  paralysis.       5. Pneumothorax:  Puncturing of a lung is a possibility, every time a needle is introduced in  the area of the chest or upper back.  Pneumothorax refers to free air around the  collapsed lung(s), inside of the thoracic cavity (chest cavity).  Another two possible  complications related to a similar event would include: Hemothorax and Chylothorax.   These are variations of the Pneumothorax, where instead of air around the collapsed  lung(s), you may have blood or chyle, respectively.       6. Spinal headaches: They may occur with any procedures in the area of the spine.       7. Persistent CSF (Cerebro-Spinal Fluid) leakage: This is a rare problem, but may occur  with prolonged intrathecal or epidural catheters either due to the formation of a fistulous  track or a dural tear.       8. Nerve damage: By working so close to the spinal cord, there is always a possibility of  nerve damage, which could be as serious as a permanent spinal cord injury with  paralysis.       9. Death:  Although rare, severe deadly allergic reactions known as "Anaphylactic  reaction" can occur to any of the medications used.      10. Worsening of the symptoms:  We can always make thing worse.  What are the chances of something like this happening? Chances of any of this occuring are extremely low.  By statistics, you have more of a chance of getting killed in a motor vehicle accident: while driving to the hospital than any of the above occurring .  Nevertheless, you should be aware that they are possibilities.  In general, it is similar to taking a shower.  Everybody knows that you can slip, hit  your head and get killed.  Does that mean that you should not shower again?  Nevertheless always keep in mind that statistics do not mean anything if you happen to be on the wrong side of them.  Even if a procedure has a 1 (one) in a 1,000,000 (million) chance of going wrong, it you happen to be that one..Also, keep in mind that by statistics, you have more of a chance of having something go wrong when taking medications.  Who should not have this procedure? If you are on a blood thinning medication (e.g. Coumadin, Plavix, see list of "Blood Thinners"), or if you have an active infection going on, you should not have the procedure.  If you are taking any blood thinners, please inform your physician.  How should I prepare for this procedure?  Do not eat or drink anything at least six hours prior to the procedure.  Bring a driver  with you .  It cannot be a taxi.  Come accompanied by an adult that can drive you back, and that is strong enough to help you if your legs get weak or numb from the local anesthetic.  Take all of your medicines the morning of the procedure with just enough water to swallow them.  If you have diabetes, make sure that you are scheduled to have your procedure done first thing in the morning, whenever possible.  If you have diabetes, take only half of your insulin dose and notify our nurse that you have done so as soon as you arrive at the clinic.  If you are diabetic, but only take blood sugar pills (oral hypoglycemic), then do not take them on the morning of your procedure.  You may take them after you have had the procedure.  Do not take aspirin or any aspirin-containing medications, at least eleven (11) days prior to the procedure.  They may prolong bleeding.  Wear loose fitting clothing that may be easy to take off and that you would not mind if it got stained with Betadine or blood.  Do not wear any jewelry or perfume  Remove any nail coloring.  It will interfere  with some of our monitoring equipment.  NOTE: Remember that this is not meant to be interpreted as a complete list of all possible complications.  Unforeseen problems may occur.  BLOOD THINNERS The following drugs contain aspirin or other products, which can cause increased bleeding during surgery and should not be taken for 2 weeks prior to and 1 week after surgery.  If you should need take something for relief of minor pain, you may take acetaminophen which is found in Tylenol,m Datril, Anacin-3 and Panadol. It is not blood thinner. The products listed below are.  Do not take any of the products listed below in addition to any listed on your instruction sheet.  A.P.C or A.P.C with Codeine Codeine Phosphate Capsules #3 Ibuprofen Ridaura  ABC compound Congesprin Imuran rimadil  Advil Cope Indocin Robaxisal  Alka-Seltzer Effervescent Pain Reliever and Antacid Coricidin or Coricidin-D  Indomethacin Rufen  Alka-Seltzer plus Cold Medicine Cosprin Ketoprofen S-A-C Tablets  Anacin Analgesic Tablets or Capsules Coumadin Korlgesic Salflex  Anacin Extra Strength Analgesic tablets or capsules CP-2 Tablets Lanoril Salicylate  Anaprox Cuprimine Capsules Levenox Salocol  Anexsia-D Dalteparin Magan Salsalate  Anodynos Darvon compound Magnesium Salicylate Sine-off  Ansaid Dasin Capsules Magsal Sodium Salicylate  Anturane Depen Capsules Marnal Soma  APF Arthritis pain formula Dewitt's Pills Measurin Stanback  Argesic Dia-Gesic Meclofenamic Sulfinpyrazone  Arthritis Bayer Timed Release Aspirin Diclofenac Meclomen Sulindac  Arthritis pain formula Anacin Dicumarol Medipren Supac  Analgesic (Safety coated) Arthralgen Diffunasal Mefanamic Suprofen  Arthritis Strength Bufferin Dihydrocodeine Mepro Compound Suprol  Arthropan liquid Dopirydamole Methcarbomol with Aspirin Synalgos  ASA tablets/Enseals Disalcid Micrainin Tagament  Ascriptin Doan's Midol Talwin  Ascriptin A/D Dolene Mobidin Tanderil  Ascriptin  Extra Strength Dolobid Moblgesic Ticlid  Ascriptin with Codeine Doloprin or Doloprin with Codeine Momentum Tolectin  Asperbuf Duoprin Mono-gesic Trendar  Aspergum Duradyne Motrin or Motrin IB Triminicin  Aspirin plain, buffered or enteric coated Durasal Myochrisine Trigesic  Aspirin Suppositories Easprin Nalfon Trillsate  Aspirin with Codeine Ecotrin Regular or Extra Strength Naprosyn Uracel  Atromid-S Efficin Naproxen Ursinus  Auranofin Capsules Elmiron Neocylate Vanquish  Axotal Emagrin Norgesic Verin  Azathioprine Empirin or Empirin with Codeine Normiflo Vitamin E  Azolid Emprazil Nuprin Voltaren  Bayer Aspirin plain, buffered or children's or timed BC Tablets or powders  Encaprin Orgaran Warfarin Sodium  Buff-a-Comp Enoxaparin Orudis Zorpin  Buff-a-Comp with Codeine Equegesic Os-Cal-Gesic   Buffaprin Excedrin plain, buffered or Extra Strength Oxalid   Bufferin Arthritis Strength Feldene Oxphenbutazone   Bufferin plain or Extra Strength Feldene Capsules Oxycodone with Aspirin   Bufferin with Codeine Fenoprofen Fenoprofen Pabalate or Pabalate-SF   Buffets II Flogesic Panagesic   Buffinol plain or Extra Strength Florinal or Florinal with Codeine Panwarfarin   Buf-Tabs Flurbiprofen Penicillamine   Butalbital Compound Four-way cold tablets Penicillin   Butazolidin Fragmin Pepto-Bismol   Carbenicillin Geminisyn Percodan   Carna Arthritis Reliever Geopen Persantine   Carprofen Gold's salt Persistin   Chloramphenicol Goody's Phenylbutazone   Chloromycetin Haltrain Piroxlcam   Clmetidine heparin Plaquenil   Cllnoril Hyco-pap Ponstel   Clofibrate Hydroxy chloroquine Propoxyphen         Before stopping any of these medications, be sure to consult the physician who ordered them.  Some, such as Coumadin (Warfarin) are ordered to prevent or treat serious conditions such as "deep thrombosis", "pumonary embolisms", and other heart problems.  The amount of time that you may need off of the  medication may also vary with the medication and the reason for which you were taking it.  If you are taking any of these medications, please make sure you notify your pain physician before you undergo any procedures.         Selective Nerve Root Block Patient Information  Description: Specific nerve roots exit the spinal canal and these nerves can be compressed and inflamed by a bulging disc and bone spurs.  By injecting steroids on the nerve root, we can potentially decrease the inflammation surrounding these nerves, which often leads to decreased pain.  Also, by injecting local anesthesia on the nerve root, this can provide [place] helpful information to give to your referring doctor if it decreases your pain.  Selective nerve root blocks can be done along the spine from the neck to the low back depending on the location of your pain.   After numbing the skin with local anesthesia, a small needle is passed to the nerve root and the position of the needle is verified using x-ray pictures.  After the needle is in correct position, we then deposit the medication.  You may experience a pressure sensation while this is being done.  The entire block usually lasts less than 15 minutes.  Conditions that may be treated with selective nerve root blocks:  Low back and leg pain  Spinal stenosis  Diagnostic block prior to potential surgery  Neck and arm pain  Post laminectomy syndrome  Preparation for the injection:  1. Do not eat any solid food or dairy products within 6 hours of your appointment. 2. You may drink clear liquids up to 2 hours before an appointment.  Clear liquids include water, black coffee, juice or soda.  No milk or cream please. 3. You may take your regular medications, including pain medications, with a sip of water before your appointment.  Diabetics should hold regular insulin (if taken separately) and take 1/2 normal NPH dose the morning of the procedure.  Carry some sugar  containing items with you to your appointment. 4. A driver must accompany you and be prepared to drive you home after your procedure. 5. Bring all your current medications with you. 6. An IV may be inserted and sedation may be given at the discretion of the physician. 7. A blood pressure cuff, EKG, and other monitors will often be  applied during the procedure.  Some patients may need to have extra oxygen administered for a short period. 8. You will be asked to provide medical information, including allergies, prior to the procedure.  We must know immediately if you are taking blood  Thinners (like Coumadin) or if you are allergic to IV iodine contrast (dye).  Possible side-effects: All are usually temporary  Bleeding from needle site  Light headedness  Numbness and tingling  Decreased blood pressure  Weakness in arms/legs  Pressure sensation in back/neck  Pain at injection site (several days)  Possible complications: All are extremely rare  Infection  Nerve injury  Spinal headache (a headache wore with upright position)  Call if you experience:  Fever/chills associated with headache or increased back/neck pain  Headache worsened by an upright position  New onset weakness or numbness of an extremity below the injection site  Hives or difficulty breathing (go to the emergency room)  Inflammation or drainage at the injection site(s)  Severe back/neck pain greater than usual  New symptoms which are concerning to you  Please note:  Although the local anesthetic injected can often make your back or neck feel good for several hours after the injection the pain will likely return.  It takes 3-5 days for steroids to work on the nerve root. You may not notice any pain relief for at least one week.  If effective, we will often do a series of 3 injections spaced 3-6 weeks apart to maximally decrease your pain.    If you have any questions, please call  [phone] [facility]

## 2015-01-09 NOTE — Progress Notes (Signed)
Safety precautions to be maintained throughout the outpatient stay will include: orient to surroundings, keep bed in low position, maintain call bell within reach at all times, provide assistance with transfer out of bed and ambulation.  

## 2015-01-09 NOTE — Progress Notes (Signed)
Subjective:    Patient ID: Lawrence Coffee Sr., male    DOB: [birth year], 81 y.o.   MRN: [id number]  HPI PROCEDURE PERFORMED: Lumbosacral selective nerve root block   NOTE: The patient is a 81 y.o. male who returns to [facility] for further evaluation and treatment of pain involving the lumbar and lower extremity region. Studies consisting of MRI has revealed the patient to be with evidence  of prior posterior decompression L2-3 and L3 for relieving the central stenosis seen on prior exam. Foramina at both levels appear with little change with mild to moderate stenosis. Patient L1 to degenerative disc disease with mild central stenosis associated with broad-based disc bulging and facet hypertrophy. Retrolisthesis L1 on L2, L2 on L3, and L3 on L4, all grade 1. L4-5 and L5-S1 levels with moderate foraminal stenosis and moderate bilateral foraminal stenosis. The patient is with severe pain of the lumbar lower extremity region and right lower extremity region with numbness and tingling sensation and with pain becoming more intense as patient spends time on the feet. There is concern regarding radicular component of patient's pain. Decision has been made to proceed with lumbosacral selective nerve root block in attempt to decrease severity of symptoms, minimize progression of symptoms, and avoid need for more involved treatment. The risks, benefits, and expectations of the procedure have been explained to the patient who was understanding and in agreement with suggested treatment plan. We will proceed with interventional treatment as discussed and as explained to the patient. The patient is understanding and in agreement with suggested treatment plan.   DESCRIPTION OF PROCEDURE: Lumbosacral selective nerve root block with IV Versed, IV fentanyl conscious sedation, EKG, blood pressure, pulse, and pulse oximetry monitoring. The procedure was performed with the patient in the prone position under  fluoroscopic guidance. With the patient in the prone position, Betadine prep of proposed entry site was performed. Local anesthetic skin wheal of proposed needle entry site was prepared with 1.5% plain lidocaine with AP view of the lumbosacral spine.   PROCEDURE #1: Needle placement at the right L 2 vertebral body: A 22 -gauge needle was inserted at the inferior border of the transverse process of the vertebral body with needle placed medial to the midline of the transverse process on AP view of the lumbosacral spine.   NEEDLE PLACEMENT AT  L3, L4, and L5  VERTEBRAL BODY LEVELS  Needle  placement was accomplished at L3, L4, and L5  vertebral body levels on the right side exactly as was accomplished at the L2  vertebral body level  and utilizing the same technique and under fluoroscopic guidance.  PROCEDURE #4: Needle placement at the S1 foramen. With the patient in the prone position with Betadine prep of proposed entry site accomplished, the S1 foramen was visualized under fluoroscopic guidance with AP view of the lumbosacral spine with cephalad orientation of the fluoroscope with local anesthetic skin wheal of 1.5% lidocaine of proposed needle entry site prepared. A 22-gauge needle was inserted S1 foramen under fluoroscopic guidance eliciting paresthesias radiating from the buttocks to the lower extremity after which needle was slightly withdrawn.   Needle placement was then verified on lateral view at all levels with needle tip documented to be in the posterior superior quadrant of the intervertebral foramen of  L 2, L3, L4, and L5. Following negative aspiration for heme and CSF at each level, each level was injected with 3 mL of 0.25% bupivacaine with Kenalog.  Myoneural block injections of  the gluteal musculature region Following Betadine prep proposed entry site a 22-gauge needle was inserted in the gluteal musculature region and following negative aspiration 2 cc of 0.25% bupivacaine with  Norflex was injected for myoneural block injection gluteal musculature region 2    The patient tolerated the procedure well. A total of 10 mg of Kenalog was utilized for the procedure.   PLAN:  1. Medications: Will continue presently prescribed medications. 2. The patient is to undergo follow-up evaluation with PCP Dr.Shah  for evaluation of blood pressure and general medical condition status post procedure performed on today's visit. 3. Surgical follow-up evaluation.May consider pending further evaluation and pending response to present treatment  4. Neurological evaluation.May consider PN[facility] EMG studies and other studies pending response to treatment  5. May consider radiofrequency procedures, implantation type procedures and other treatment pending response to treatment and follow-up evaluation. 6. The patient has been advise do adhere to proper body mechanics and avoid activities which may aggravate condition. 7. The patient has been advised to call the Pain Management Center prior to scheduled return appointment should there be significant change in the patient's condition or should the patient have other concerns regarding condition prior to scheduled return appointment.      Review of Systems     Objective:   Physical Exam        Assessment & Plan:

## 2015-01-09 NOTE — Addendum Note (Signed)
Addended by: Burtis Junes on: 01/09/2015 10:57 AM   Modules accepted: Orders

## 2015-01-10 NOTE — Telephone Encounter (Signed)
No problems post procedure. 

## 2015-01-24 ENCOUNTER — Telehealth: Payer: Self-pay | Admitting: Family Medicine

## 2015-01-24 DIAGNOSIS — I10 Essential (primary) hypertension: Secondary | ICD-10-CM

## 2015-01-24 MED ORDER — LISINOPRIL 10 MG PO TABS: 10.0000 mg | ORAL_TABLET | Freq: Every day | ORAL | Status: DC

## 2015-01-24 NOTE — Telephone Encounter (Signed)
Medication has been refilled and sent to [facility]

## 2015-01-24 NOTE — Telephone Encounter (Signed)
Patient is completely out of Lisinopril 10 mg. requesting that a 90 day supply be sent to [facility] [employer].

## 2015-02-12 ENCOUNTER — Ambulatory Visit: Payer: PPO | Attending: Pain Medicine | Admitting: Pain Medicine

## 2015-02-12 ENCOUNTER — Encounter: Payer: Self-pay | Admitting: Pain Medicine

## 2015-02-12 VITALS — BP 105/69 | HR 75 | Temp 98.1°F | Resp 16 | Ht 69.0 in | Wt 196.0 lb

## 2015-02-12 DIAGNOSIS — M4316 Spondylolisthesis, lumbar region: Secondary | ICD-10-CM | POA: Insufficient documentation

## 2015-02-12 DIAGNOSIS — M79604 Pain in right leg: Secondary | ICD-10-CM | POA: Diagnosis present

## 2015-02-12 DIAGNOSIS — M5136 Other intervertebral disc degeneration, lumbar region: Secondary | ICD-10-CM

## 2015-02-12 DIAGNOSIS — M4806 Spinal stenosis, lumbar region: Secondary | ICD-10-CM | POA: Insufficient documentation

## 2015-02-12 DIAGNOSIS — M79605 Pain in left leg: Secondary | ICD-10-CM | POA: Diagnosis present

## 2015-02-12 DIAGNOSIS — M533 Sacrococcygeal disorders, not elsewhere classified: Secondary | ICD-10-CM | POA: Insufficient documentation

## 2015-02-12 DIAGNOSIS — M48061 Spinal stenosis, lumbar region without neurogenic claudication: Secondary | ICD-10-CM

## 2015-02-12 DIAGNOSIS — M5116 Intervertebral disc disorders with radiculopathy, lumbar region: Secondary | ICD-10-CM | POA: Diagnosis not present

## 2015-02-12 DIAGNOSIS — M5126 Other intervertebral disc displacement, lumbar region: Secondary | ICD-10-CM | POA: Insufficient documentation

## 2015-02-12 DIAGNOSIS — M5416 Radiculopathy, lumbar region: Secondary | ICD-10-CM

## 2015-02-12 DIAGNOSIS — M545 Low back pain: Secondary | ICD-10-CM | POA: Diagnosis present

## 2015-02-12 DIAGNOSIS — M47816 Spondylosis without myelopathy or radiculopathy, lumbar region: Secondary | ICD-10-CM

## 2015-02-12 NOTE — Progress Notes (Signed)
Safety precautions to be maintained throughout the outpatient stay will include: orient to surroundings, keep bed in low position, maintain call bell within reach at all times, provide assistance with transfer out of bed and ambulation.  

## 2015-02-12 NOTE — Patient Instructions (Addendum)
PLAN  Continue present medication  Lumbosacral selective nerve root block to be performed at time of return appointment  F/U PCP Dr. Manuella Ghazi for evaliation of  BP and general medical  condition.  F/U surgical evaluation. May consider pending follow-up evaluations  F/U neurological evaluation. May consider pending follow-up evaluations  May consider radiofrequency rhizolysis or intraspinal procedures pending response to present treatment and F/U evaluation.  Patient to call Pain Management Center should patient have concerns prior to scheduled return appointment. Pain Management Discharge Instructions  General Discharge Instructions :  If you need to reach your doctor call: Monday-Friday 8:00 am - 4:00 pm at [phone] or toll free [phone].  After clinic hours [phone] to have operator reach doctor.  Bring all of your medication bottles to all your appointments in the pain clinic.  To cancel or reschedule your appointment with Pain Management please remember to call 24 hours in advance to avoid a fee.  Refer to the educational materials which you have been given on: General Risks, I had my Procedure. Discharge Instructions, Post Sedation.  Post Procedure Instructions:  The drugs you were given will stay in your system until tomorrow, so for the next 24 hours you should not drive, make any legal decisions or drink any alcoholic beverages.  You may eat anything you prefer, but it is better to start with liquids then soups and crackers, and gradually work up to solid foods.  Please notify your doctor immediately if you have any unusual bleeding, trouble breathing or pain that is not related to your normal pain.  Depending on the type of procedure that was done, some parts of your body may feel week and/or numb.  This usually clears up by tonight or the next day.  Walk with the use of an assistive device or accompanied by an adult for the 24 hours.  You may use ice on the  affected area for the first 24 hours.  Put ice in a Ziploc bag and cover with a towel and place against area 15 minutes on 15 minutes off.  You may switch to heat after 24 hours.Selective Nerve Root Block Patient Information  Description: Specific nerve roots exit the spinal canal and these nerves can be compressed and inflamed by a bulging disc and bone spurs.  By injecting steroids on the nerve root, we can potentially decrease the inflammation surrounding these nerves, which often leads to decreased pain.  Also, by injecting local anesthesia on the nerve root, this can provide [place] helpful information to give to your referring doctor if it decreases your pain.  Selective nerve root blocks can be done along the spine from the neck to the low back depending on the location of your pain.   After numbing the skin with local anesthesia, a small needle is passed to the nerve root and the position of the needle is verified using x-ray pictures.  After the needle is in correct position, we then deposit the medication.  You may experience a pressure sensation while this is being done.  The entire block usually lasts less than 15 minutes.  Conditions that may be treated with selective nerve root blocks:  Low back and leg pain  Spinal stenosis  Diagnostic block prior to potential surgery  Neck and arm pain  Post laminectomy syndrome  Preparation for the injection:  1. Do not eat any solid food or dairy products within 6 hours of your appointment. 2. You may drink clear liquids up to 2 hours  before an appointment.  Clear liquids include water, black coffee, juice or soda.  No milk or cream please. 3. You may take your regular medications, including pain medications, with a sip of water before your appointment.  Diabetics should hold regular insulin (if taken separately) and take 1/2 normal NPH dose the morning of the procedure.  Carry some sugar containing items with you to your appointment. 4. A driver  must accompany you and be prepared to drive you home after your procedure. 5. Bring all your current medications with you. 6. An IV may be inserted and sedation may be given at the discretion of the physician. 7. A blood pressure cuff, EKG, and other monitors will often be applied during the procedure.  Some patients may need to have extra oxygen administered for a short period. 8. You will be asked to provide medical information, including allergies, prior to the procedure.  We must know immediately if you are taking blood  Thinners (like Coumadin) or if you are allergic to IV iodine contrast (dye).  Possible side-effects: All are usually temporary  Bleeding from needle site  Light headedness  Numbness and tingling  Decreased blood pressure  Weakness in arms/legs  Pressure sensation in back/neck  Pain at injection site (several days)  Possible complications: All are extremely rare  Infection  Nerve injury  Spinal headache (a headache wore with upright position)  Call if you experience:  Fever/chills associated with headache or increased back/neck pain  Headache worsened by an upright position  New onset weakness or numbness of an extremity below the injection site  Hives or difficulty breathing (go to the emergency room)  Inflammation or drainage at the injection site(s)  Severe back/neck pain greater than usual  New symptoms which are concerning to you  Please note:  Although the local anesthetic injected can often make your back or neck feel good for several hours after the injection the pain will likely return.  It takes 3-5 days for steroids to work on the nerve root. You may not notice any pain relief for at least one week.  If effective, we will often do a series of 3 injections spaced 3-6 weeks apart to maximally decrease your pain.    If you have any questions, please call [phone] [facility] Clinic[facility]AL RISKS AND  COMPLICATIONS  What are the risk, side effects and possible complications? Generally speaking, most procedures are safe.  However, with any procedure there are risks, side effects, and the possibility of complications.  The risks and complications are dependent upon the sites that are lesioned, or the type of nerve block to be performed.  The closer the procedure is to the spine, the more serious the risks are.  Great care is taken when placing the radio frequency needles, block needles or lesioning probes, but sometimes complications can occur. 1. Infection: Any time there is an injection through the skin, there is a risk of infection.  This is why sterile conditions are used for these blocks.  There are four possible types of infection. 1. Localized skin infection. 2. Central Nervous System Infection-This can be in the form of Meningitis, which can be deadly. 3. Epidural Infections-This can be in the form of an epidural abscess, which can cause pressure inside of the spine, causing compression of the spinal cord with subsequent paralysis. This would require an emergency surgery to decompress, and there are no guarantees that the patient would recover from the paralysis. 4. Discitis-This is  an infection of the intervertebral discs.  It occurs in about 1% of discography procedures.  It is difficult to treat and it may lead to surgery.        2. Pain: the needles have to go through skin and soft tissues, will cause soreness.       3. Damage to internal structures:  The nerves to be lesioned may be near blood vessels or    other nerves which can be potentially damaged.       4. Bleeding: Bleeding is more common if the patient is taking blood thinners such as  aspirin, Coumadin, Ticiid, Plavix, etc., or if he/she have some genetic predisposition  such as hemophilia. Bleeding into the spinal canal can cause compression of the spinal  cord with subsequent paralysis.  This would require an emergency surgery  to  decompress and there are no guarantees that the patient would recover from the  paralysis.       5. Pneumothorax:  Puncturing of a lung is a possibility, every time a needle is introduced in  the area of the chest or upper back.  Pneumothorax refers to free air around the  collapsed lung(s), inside of the thoracic cavity (chest cavity).  Another two possible  complications related to a similar event would include: Hemothorax and Chylothorax.   These are variations of the Pneumothorax, where instead of air around the collapsed  lung(s), you may have blood or chyle, respectively.       6. Spinal headaches: They may occur with any procedures in the area of the spine.       7. Persistent CSF (Cerebro-Spinal Fluid) leakage: This is a rare problem, but may occur  with prolonged intrathecal or epidural catheters either due to the formation of a fistulous  track or a dural tear.       8. Nerve damage: By working so close to the spinal cord, there is always a possibility of  nerve damage, which could be as serious as a permanent spinal cord injury with  paralysis.       9. Death:  Although rare, severe deadly allergic reactions known as "Anaphylactic  reaction" can occur to any of the medications used.      10. Worsening of the symptoms:  We can always make thing worse.  What are the chances of something like this happening? Chances of any of this occuring are extremely low.  By statistics, you have more of a chance of getting killed in a motor vehicle accident: while driving to the hospital than any of the above occurring .  Nevertheless, you should be aware that they are possibilities.  In general, it is similar to taking a shower.  Everybody knows that you can slip, hit your head and get killed.  Does that mean that you should not shower again?  Nevertheless always keep in mind that statistics do not mean anything if you happen to be on the wrong side of them.  Even if a procedure has a 1 (one) in a 1,000,000  (million) chance of going wrong, it you happen to be that one..Also, keep in mind that by statistics, you have more of a chance of having something go wrong when taking medications.  Who should not have this procedure? If you are on a blood thinning medication (e.g. Coumadin, Plavix, see list of "Blood Thinners"), or if you have an active infection going on, you should not have the procedure.  If you are taking  any blood thinners, please inform your physician.  How should I prepare for this procedure?  Do not eat or drink anything at least six hours prior to the procedure.  Bring a driver with you .  It cannot be a taxi.  Come accompanied by an adult that can drive you back, and that is strong enough to help you if your legs get weak or numb from the local anesthetic.  Take all of your medicines the morning of the procedure with just enough water to swallow them.  If you have diabetes, make sure that you are scheduled to have your procedure done first thing in the morning, whenever possible.  If you have diabetes, take only half of your insulin dose and notify our nurse that you have done so as soon as you arrive at the clinic.  If you are diabetic, but only take blood sugar pills (oral hypoglycemic), then do not take them on the morning of your procedure.  You may take them after you have had the procedure.  Do not take aspirin or any aspirin-containing medications, at least eleven (11) days prior to the procedure.  They may prolong bleeding.  Wear loose fitting clothing that may be easy to take off and that you would not mind if it got stained with Betadine or blood.  Do not wear any jewelry or perfume  Remove any nail coloring.  It will interfere with some of our monitoring equipment.  NOTE: Remember that this is not meant to be interpreted as a complete list of all possible complications.  Unforeseen problems may occur.  BLOOD THINNERS The following drugs contain aspirin or other  products, which can cause increased bleeding during surgery and should not be taken for 2 weeks prior to and 1 week after surgery.  If you should need take something for relief of minor pain, you may take acetaminophen which is found in Tylenol,m Datril, Anacin-3 and Panadol. It is not blood thinner. The products listed below are.  Do not take any of the products listed below in addition to any listed on your instruction sheet.  A.P.C or A.P.C with Codeine Codeine Phosphate Capsules #3 Ibuprofen Ridaura  ABC compound Congesprin Imuran rimadil  Advil Cope Indocin Robaxisal  Alka-Seltzer Effervescent Pain Reliever and Antacid Coricidin or Coricidin-D  Indomethacin Rufen  Alka-Seltzer plus Cold Medicine Cosprin Ketoprofen S-A-C Tablets  Anacin Analgesic Tablets or Capsules Coumadin Korlgesic Salflex  Anacin Extra Strength Analgesic tablets or capsules CP-2 Tablets Lanoril Salicylate  Anaprox Cuprimine Capsules Levenox Salocol  Anexsia-D Dalteparin Magan Salsalate  Anodynos Darvon compound Magnesium Salicylate Sine-off  Ansaid Dasin Capsules Magsal Sodium Salicylate  Anturane Depen Capsules Marnal Soma  APF Arthritis pain formula Dewitt's Pills Measurin Stanback  Argesic Dia-Gesic Meclofenamic Sulfinpyrazone  Arthritis Bayer Timed Release Aspirin Diclofenac Meclomen Sulindac  Arthritis pain formula Anacin Dicumarol Medipren Supac  Analgesic (Safety coated) Arthralgen Diffunasal Mefanamic Suprofen  Arthritis Strength Bufferin Dihydrocodeine Mepro Compound Suprol  Arthropan liquid Dopirydamole Methcarbomol with Aspirin Synalgos  ASA tablets/Enseals Disalcid Micrainin Tagament  Ascriptin Doan's Midol Talwin  Ascriptin A/D Dolene Mobidin Tanderil  Ascriptin Extra Strength Dolobid Moblgesic Ticlid  Ascriptin with Codeine Doloprin or Doloprin with Codeine Momentum Tolectin  Asperbuf Duoprin Mono-gesic Trendar  Aspergum Duradyne Motrin or Motrin IB Triminicin  Aspirin plain, buffered or enteric  coated Durasal Myochrisine Trigesic  Aspirin Suppositories Easprin Nalfon Trillsate  Aspirin with Codeine Ecotrin Regular or Extra Strength Naprosyn Uracel  Atromid-S Efficin Naproxen Ursinus  Auranofin Capsules Elmiron Neocylate  Vanquish  Axotal Emagrin Norgesic Verin  Azathioprine Empirin or Empirin with Codeine Normiflo Vitamin E  Azolid Emprazil Nuprin Voltaren  Bayer Aspirin plain, buffered or children's or timed BC Tablets or powders Encaprin Orgaran Warfarin Sodium  Buff-a-Comp Enoxaparin Orudis Zorpin  Buff-a-Comp with Codeine Equegesic Os-Cal-Gesic   Buffaprin Excedrin plain, buffered or Extra Strength Oxalid   Bufferin Arthritis Strength Feldene Oxphenbutazone   Bufferin plain or Extra Strength Feldene Capsules Oxycodone with Aspirin   Bufferin with Codeine Fenoprofen Fenoprofen Pabalate or Pabalate-SF   Buffets II Flogesic Panagesic   Buffinol plain or Extra Strength Florinal or Florinal with Codeine Panwarfarin   Buf-Tabs Flurbiprofen Penicillamine   Butalbital Compound Four-way cold tablets Penicillin   Butazolidin Fragmin Pepto-Bismol   Carbenicillin Geminisyn Percodan   Carna Arthritis Reliever Geopen Persantine   Carprofen Gold's salt Persistin   Chloramphenicol Goody's Phenylbutazone   Chloromycetin Haltrain Piroxlcam   Clmetidine heparin Plaquenil   Cllnoril Hyco-pap Ponstel   Clofibrate Hydroxy chloroquine Propoxyphen         Before stopping any of these medications, be sure to consult the physician who ordered them.  Some, such as Coumadin (Warfarin) are ordered to prevent or treat serious conditions such as "deep thrombosis", "pumonary embolisms", and other heart problems.  The amount of time that you may need off of the medication may also vary with the medication and the reason for which you were taking it.  If you are taking any of these medications, please make sure you notify your pain physician before you undergo any procedures.

## 2015-02-12 NOTE — Progress Notes (Signed)
Subjective:    Patient ID: Lawrence Coffee Sr., male    DOB: [birth year], 81 y.o.   MRN: [id number]  HPI The patient is an 81 year old gentleman who returns to pain management for further evaluation and treatment of pain involving the lower back and lower extremity region. The patient has had significant improvement of pain following previous procedure performed in pain management center and has return of significant pain at this time involving the lower back and lower extremity region especially on the right. Patient denies any trauma change in events of daily living because no changes of the pathology. We discussed patient's condition and will proceed with lumbosacral selective nerve root block at time return appointment in attempt to decrease severity of symptoms, minimize progression of symptoms, and avoid the need for more involved treatment. The patient prefers to avoid lumbar epidural steroid injection. All were understanding and agreed with suggested treatment plan       Review of Systems     Objective:   Physical Exam  There was tenderness to palpation of the paraspinal musculature and cervical region cervical facet region palpation which reproduces minimal discomfort. There was minimal tenderness to palpation of the cervical facet cervical paraspinal must reason and splenius capitis and a separate talus muscles regions. There was minimal tense to palpation over the thoracic facet thoracic paraspinal muscular treat. Patient was with bilaterally equal grip strength and Tinel and Phalen's maneuver were without increased pain of significant degree. Palpation over the lumbar paraspinal musculature region lumbar facet region was attends to palpation of moderate degree with the right side being more significantly tenderness to palpation in the left side. There was tenderness over the lumbar facet lumbar paraspinal musculature region of the right region the left. There was tends to palpation  over the PSIS and PII S region as well as the gluteal and piriformis musculature regions on the right greater than the left as well. Straight leg raising is limited to approximately 20 without increased pain with dorsiflexion noted. There was negative clonus negative Homans. EHL strength appeared to be decreased. No allodynia of the lower extremity was noted. There was negative clonus negative Homans. No definite sensory deficit or dermatomal distribution detected. Mild tenderness of the greater trochanteric region iliotibial band region. Abdomen nontender with no costovertebral tenderness noted.      Assessment & Plan:      MRI has revealed the patient to be with evidence  of prior posterior decompression L2-3 and L3 for relieving the central stenosis seen on prior exam. Foramina at both levels appear with little change with mild to moderate stenosis. Patient L1 to degenerative disc disease with mild central stenosis associated with broad-based disc bulging and facet hypertrophy. Retrolisthesis L1 on L2, L2 on L3, and L3 on L4, all grade 1. L4-5 and L5-S1 levels with moderate foraminal stenosis and moderate bilateral foraminal stenosis  Lumbar radiculopathy  Lumbar facet syndrome  Lumbar stenosis with neurogenic claudication  Sacroiliac joint dysfunction    PLAN  Continue present medication  Lumbosacral selective nerve root block to be performed at time of return appointment pending medical clearance by Dr.Fath and and Dr. Manuella Ghazi   F/U PCP Dr. Manuella Ghazi for evaliation of  BP and general medical  condition.  F/U surgical evaluation. May consider pending follow-up evaluations  F/U neurological evaluation. May consider pending follow-up evaluations  May consider radiofrequency rhizolysis or intraspinal procedures pending response to present treatment and F/U evaluation.  Patient to call Pain Management Center for  any concerns prior to scheduled return appointment

## 2015-02-21 ENCOUNTER — Telehealth: Payer: Self-pay | Admitting: *Deleted

## 2015-02-21 NOTE — Telephone Encounter (Signed)
Patient called and told to stop aspirin 5 days before procedure next week. Procedure approved by Dr Bartholome Bill.

## 2015-02-27 ENCOUNTER — Encounter: Payer: Self-pay | Admitting: Pain Medicine

## 2015-02-27 ENCOUNTER — Telehealth: Payer: Self-pay | Admitting: Family Medicine

## 2015-02-27 ENCOUNTER — Other Ambulatory Visit: Payer: Self-pay | Admitting: Family Medicine

## 2015-02-27 ENCOUNTER — Ambulatory Visit: Payer: PPO | Attending: Pain Medicine | Admitting: Pain Medicine

## 2015-02-27 VITALS — BP 111/67 | HR 63 | Temp 98.2°F | Resp 16 | Ht 68.0 in | Wt 195.0 lb

## 2015-02-27 DIAGNOSIS — M545 Low back pain: Secondary | ICD-10-CM | POA: Diagnosis not present

## 2015-02-27 DIAGNOSIS — M47816 Spondylosis without myelopathy or radiculopathy, lumbar region: Secondary | ICD-10-CM

## 2015-02-27 DIAGNOSIS — M48061 Spinal stenosis, lumbar region without neurogenic claudication: Secondary | ICD-10-CM

## 2015-02-27 DIAGNOSIS — M4316 Spondylolisthesis, lumbar region: Secondary | ICD-10-CM | POA: Insufficient documentation

## 2015-02-27 DIAGNOSIS — M79605 Pain in left leg: Secondary | ICD-10-CM | POA: Diagnosis present

## 2015-02-27 DIAGNOSIS — M79604 Pain in right leg: Secondary | ICD-10-CM | POA: Diagnosis present

## 2015-02-27 DIAGNOSIS — M5136 Other intervertebral disc degeneration, lumbar region: Secondary | ICD-10-CM | POA: Diagnosis not present

## 2015-02-27 DIAGNOSIS — M5416 Radiculopathy, lumbar region: Secondary | ICD-10-CM

## 2015-02-27 DIAGNOSIS — M4806 Spinal stenosis, lumbar region: Secondary | ICD-10-CM | POA: Insufficient documentation

## 2015-02-27 MED ORDER — CEFAZOLIN SODIUM 1 G IJ SOLR
INTRAMUSCULAR | Status: AC
  Administered 2015-02-27: 1 g via INTRAVENOUS
  Filled 2015-02-27: qty 10

## 2015-02-27 MED ORDER — LACTATED RINGERS IV SOLN: 1000.0000 mL | INTRAVENOUS | Status: DC

## 2015-02-27 MED ORDER — SODIUM CHLORIDE 0.9 % IJ SOLN
20.0000 mL | Freq: Once | INTRAMUSCULAR | Status: AC
  Administered 2015-02-27: 4 mL

## 2015-02-27 MED ORDER — SODIUM CHLORIDE 0.9 % IJ SOLN
INTRAMUSCULAR | Status: AC
  Administered 2015-02-27: 4 mL
  Filled 2015-02-27: qty 20

## 2015-02-27 MED ORDER — FENTANYL CITRATE (PF) 100 MCG/2ML IJ SOLN
100.0000 ug | Freq: Once | INTRAMUSCULAR | Status: AC
  Administered 2015-02-27: 50 ug via INTRAVENOUS

## 2015-02-27 MED ORDER — LIDOCAINE HCL (PF) 1 % IJ SOLN
INTRAMUSCULAR | Status: AC
  Administered 2015-02-27: 5 mL via SUBCUTANEOUS
  Filled 2015-02-27: qty 5

## 2015-02-27 MED ORDER — LIDOCAINE HCL (PF) 1 % IJ SOLN
10.0000 mL | Freq: Once | INTRAMUSCULAR | Status: AC
  Administered 2015-02-27: 5 mL via SUBCUTANEOUS

## 2015-02-27 MED ORDER — TRIAMCINOLONE ACETONIDE 40 MG/ML IJ SUSP
40.0000 mg | Freq: Once | INTRAMUSCULAR | Status: AC
  Administered 2015-02-27: 40 mg

## 2015-02-27 MED ORDER — LEVOTHYROXINE SODIUM 75 MCG PO TABS: 75.0000 ug | ORAL_TABLET | Freq: Every day | ORAL | Status: DC

## 2015-02-27 MED ORDER — FENTANYL CITRATE (PF) 100 MCG/2ML IJ SOLN
INTRAMUSCULAR | Status: AC
  Administered 2015-02-27: 50 ug via INTRAVENOUS
  Filled 2015-02-27: qty 2

## 2015-02-27 MED ORDER — ORPHENADRINE CITRATE 30 MG/ML IJ SOLN: 60.0000 mg | Freq: Once | INTRAMUSCULAR | Status: DC

## 2015-02-27 MED ORDER — SODIUM CHLORIDE 0.9 % IJ SOLN
INTRAMUSCULAR | Status: AC
  Filled 2015-02-27: qty 20

## 2015-02-27 MED ORDER — TRIAMCINOLONE ACETONIDE 40 MG/ML IJ SUSP
INTRAMUSCULAR | Status: AC
  Administered 2015-02-27: 40 mg
  Filled 2015-02-27: qty 1

## 2015-02-27 MED ORDER — CEFUROXIME AXETIL 250 MG PO TABS: 250.0000 mg | ORAL_TABLET | Freq: Two times a day (BID) | ORAL | Status: DC

## 2015-02-27 MED ORDER — BUPIVACAINE HCL (PF) 0.25 % IJ SOLN: 30.0000 mL | Freq: Once | INTRAMUSCULAR | Status: DC

## 2015-02-27 MED ORDER — MIDAZOLAM HCL 5 MG/5ML IJ SOLN
5.0000 mg | Freq: Once | INTRAMUSCULAR | Status: AC
  Administered 2015-02-27: 3 mg via INTRAVENOUS

## 2015-02-27 MED ORDER — BUPIVACAINE HCL (PF) 0.25 % IJ SOLN
INTRAMUSCULAR | Status: DC
Start: 2015-02-27 — End: 2015-02-27
  Filled 2015-02-27: qty 30

## 2015-02-27 MED ORDER — MIDAZOLAM HCL 5 MG/5ML IJ SOLN
INTRAMUSCULAR | Status: AC
  Administered 2015-02-27: 3 mg via INTRAVENOUS
  Filled 2015-02-27: qty 5

## 2015-02-27 MED ORDER — ORPHENADRINE CITRATE 30 MG/ML IJ SOLN
INTRAMUSCULAR | Status: AC
  Filled 2015-02-27: qty 2

## 2015-02-27 MED ORDER — CEFAZOLIN SODIUM 1-5 GM-% IV SOLN: 1.0000 g | Freq: Once | INTRAVENOUS | Status: DC

## 2015-02-27 NOTE — Telephone Encounter (Signed)
Called patient's wife Lawrence Valdez on 02/27/15 @ 12:03pm per her request due issues her and her husband have been having with getting their medications refilled.  I was unable to leave a message due to voicemail not being set up.

## 2015-02-27 NOTE — Telephone Encounter (Signed)
Medication has been refilled and sent to [facility], message has been left on patient's voicemail

## 2015-02-27 NOTE — Progress Notes (Signed)
   Subjective:    Patient ID: Lawrence Coffee Sr., male    DOB: [birth year], 81 y.o.   MRN: [id number]  HPI  PROCEDURE PERFORMED: Lumbar epidural steroid injection   NOTE: The patient is a 81 y.o. male who returns to [place] for further evaluation and treatment of pain involving the lumbar and lower extremity region. MRI revealed the patient to be with MRI has revealed the patient to be with evidence  of prior posterior decompression L2-3 and L3 for relieving the central stenosis seen on prior exam. Foramina at both levels appear with little change with mild to moderate stenosis. Patient L1 to degenerative disc disease with mild central stenosis associated with broad-based disc bulging and facet hypertrophy. Retrolisthesis L1 on L2, L2 on L3, and L3 on L4, all grade 1. L4-5 and L5-S1 levels with moderate foraminal stenosis and moderate bilateral foraminal stenosis there is concern regarding patient being with symptoms due to significant lumbar stenosis with with neurogenic claudication and lumbar radiculopathy. The risks, benefits, and expectations of the procedure have been discussed and explained to the patient who was understanding and in agreement with suggested treatment plan. We will proceed with lumbar epidural steroid injection as discussed and as explained to the patient who is willing to proceed with procedure as planned.   DESCRIPTION OF PROCEDURE: Lumbar epidural steroid injection with IV Versed, IV fentanyl conscious sedation, EKG, blood pressure, pulse, and pulse oximetry monitoring. The procedure was performed with the patient in the prone position under fluoroscopic guidance. A local anesthetic skin wheal of 1.5% plain lidocaine was accomplished at proposed entry site. An 18-gauge Tuohy epidural needle was inserted at the L 3 vertebral body level right of the midline via loss-of-resistance technique with negative heme and negative CSF return. A total of 4 mL of  Preservative-Free normal saline with 40 mg of Kenalog injected incrementally via epidurally placed needle. Needle was removed.    A total of 40 mg of Kenalog was utilized for the procedure.   The patient tolerated the injection well.    PLAN:   1. Medications: We will continue presently prescribed medications. 2. Will consider modification of treatment regimen pending response to treatment rendered on today's visit and follow-up evaluation. 3. The patient is to follow-up with primary care physician Dr.Shah  regarding blood pressure and general medical condition status post lumbar epidural steroid injection performed on today's visit. 4. Surgical evaluation.Surgical evaluation has been addressed  5. Neurological evaluation.May consider [facility] EMG studies  6. The patient may be a candidate for radiofrequency procedures, implantation device, and other treatment pending response to treatment and follow-up evaluation. 7. The patient has been advised to adhere to proper body mechanics and avoid activities which appear to aggravate condition. 8. The patient has been advised to call the Pain Management Center prior to scheduled return appointment should there be significant change in condition or should there be sign  The patient is understanding and agrees with the suggested  treatment plan   Review of Systems     Objective:   Physical Exam        Assessment & Plan:

## 2015-02-27 NOTE — Telephone Encounter (Signed)
Pts wife came in upset that her husbands meds have not been called in. He is out of Levothyroxine to be called into [facility]. Please advise pt as to when this will be done.

## 2015-02-27 NOTE — Patient Instructions (Addendum)
PLAN  Continue present medication and begin taking antibiotic Ceftin as prescribed. Please obtain your antibiotic Ceftin today and begin taking antibiotic today  F/U PCP Dr. Manuella Ghazi for evaliation of  BP and general medical  condition.  F/U surgical evaluation. May consider pending follow-up evaluations  F/U neurological evaluation. May consider pending follow-up evaluations  May consider radiofrequency rhizolysis or intraspinal procedures pending response to present treatment and F/U evaluation.  Patient to call Pain Management Center should patient have concerns prior to scheduled return appointment. Epidural Steroid Injection Patient Information  Description: The epidural space surrounds the nerves as they exit the spinal cord.  In some patients, the nerves can be compressed and inflamed by a bulging disc or a tight spinal canal (spinal stenosis).  By injecting steroids into the epidural space, we can bring irritated nerves into direct contact with a potentially helpful medication.  These steroids act directly on the irritated nerves and can reduce swelling and inflammation which often leads to decreased pain.  Epidural steroids may be injected anywhere along the spine and from the neck to the low back depending upon the location of your pain.   After numbing the skin with local anesthetic (like Novocaine), a small needle is passed into the epidural space slowly.  You may experience a sensation of pressure while this is being done.  The entire block usually last less than 10 minutes.  Conditions which may be treated by epidural steroids:   Low back and leg pain  Neck and arm pain  Spinal stenosis  Post-laminectomy syndrome  Herpes zoster (shingles) pain  Pain from compression fractures  Preparation for the injection:  1. Do not eat any solid food or dairy products within 6 hours of your appointment.  2. You may drink clear liquids up to 2 hours before appointment.  Clear liquids  include water, black coffee, juice or soda.  No milk or cream please. 3. You may take your regular medication, including pain medications, with a sip of water before your appointment  Diabetics should hold regular insulin (if taken separately) and take 1/2 normal NPH dos the morning of the procedure.  Carry some sugar containing items with you to your appointment. 4. A driver must accompany you and be prepared to drive you home after your procedure.  5. Bring all your current medications with your. 6. An IV may be inserted and sedation may be given at the discretion of the physician.   7. A blood pressure cuff, EKG and other monitors will often be applied during the procedure.  Some patients may need to have extra oxygen administered for a short period. 8. You will be asked to provide medical information, including your allergies, prior to the procedure.  We must know immediately if you are taking blood thinners (like Coumadin/Warfarin)  Or if you are allergic to IV iodine contrast (dye). We must know if you could possible be pregnant.  Possible side-effects:  Bleeding from needle site  Infection (rare, may require surgery)  Nerve injury (rare)  Numbness & tingling (temporary)  Difficulty urinating (rare, temporary)  Spinal headache ( a headache worse with upright posture)  Light -headedness (temporary)  Pain at injection site (several days)  Decreased blood pressure (temporary)  Weakness in arm/leg (temporary)  Pressure sensation in back/neck (temporary)  Call if you experience:  Fever/chills associated with headache or increased back/neck pain.  Headache worsened by an upright position.  New onset weakness or numbness of an extremity below the injection site  Hives or difficulty breathing (go to the emergency room)  Inflammation or drainage at the infection site  Severe back/neck pain  Any new symptoms which are concerning to you  Please note:  Although the local  anesthetic injected can often make your back or neck feel good for several hours after the injection, the pain will likely return.  It takes 3-7 days for steroids to work in the epidural space.  You may not notice any pain relief for at least that one week.  If effective, we will often do a series of three injections spaced 3-6 weeks apart to maximally decrease your pain.  After the initial series, we generally will wait several months before considering a repeat injection of the same type.  If you have any questions, please call [phone] [facility]  General Discharge Instructions :  If you need to reach your doctor call: Monday-Friday 8:00 am - 4:00 pm at [phone] or toll free [phone].  After clinic hours [phone] to have operator reach doctor.  Bring all of your medication bottles to all your appointments in the pain clinic.  To cancel or reschedule your appointment with Pain Management please remember to call 24 hours in advance to avoid a fee.  Refer to the educational materials which you have been given on: General Risks, I had my Procedure. Discharge Instructions, Post Sedation.  Post Procedure Instructions:  The drugs you were given will stay in your system until tomorrow, so for the next 24 hours you should not drive, make any legal decisions or drink any alcoholic beverages.  You may eat anything you prefer, but it is better to start with liquids then soups and crackers, and gradually work up to solid foods.  Please notify your doctor immediately if you have any unusual bleeding, trouble breathing or pain that is not related to your normal pain.  Depending on the type of procedure that was done, some parts of your body may feel week and/or numb.  This usually clears up by tonight or the next day.  Walk with the use of an assistive device or accompanied by an adult for the 24 hours.  You  may use ice on the affected area for the first 24 hours.  Put ice in a Ziploc bag and cover with a towel and place against area 15 minutes on 15 minutes off.  You may switch to heat after 24 hours.

## 2015-02-27 NOTE — Telephone Encounter (Signed)
Errenous °

## 2015-02-28 ENCOUNTER — Telehealth: Payer: Self-pay | Admitting: *Deleted

## 2015-02-28 NOTE — Telephone Encounter (Signed)
No problems post procedure phone call. 

## 2015-03-20 ENCOUNTER — Ambulatory Visit: Payer: PPO | Admitting: Pain Medicine

## 2015-03-21 ENCOUNTER — Encounter: Payer: Self-pay | Admitting: Pain Medicine

## 2015-03-21 ENCOUNTER — Ambulatory Visit: Payer: PPO | Attending: Pain Medicine | Admitting: Pain Medicine

## 2015-03-21 VITALS — BP 121/81 | HR 81 | Temp 97.7°F | Resp 16 | Ht 68.0 in | Wt 196.0 lb

## 2015-03-21 DIAGNOSIS — M5116 Intervertebral disc disorders with radiculopathy, lumbar region: Secondary | ICD-10-CM | POA: Insufficient documentation

## 2015-03-21 DIAGNOSIS — M5136 Other intervertebral disc degeneration, lumbar region: Secondary | ICD-10-CM

## 2015-03-21 DIAGNOSIS — M5416 Radiculopathy, lumbar region: Secondary | ICD-10-CM

## 2015-03-21 DIAGNOSIS — M545 Low back pain: Secondary | ICD-10-CM | POA: Diagnosis present

## 2015-03-21 DIAGNOSIS — M4806 Spinal stenosis, lumbar region: Secondary | ICD-10-CM | POA: Diagnosis not present

## 2015-03-21 DIAGNOSIS — M47816 Spondylosis without myelopathy or radiculopathy, lumbar region: Secondary | ICD-10-CM

## 2015-03-21 DIAGNOSIS — M533 Sacrococcygeal disorders, not elsewhere classified: Secondary | ICD-10-CM | POA: Diagnosis not present

## 2015-03-21 DIAGNOSIS — M79606 Pain in leg, unspecified: Secondary | ICD-10-CM | POA: Diagnosis present

## 2015-03-21 DIAGNOSIS — M48061 Spinal stenosis, lumbar region without neurogenic claudication: Secondary | ICD-10-CM

## 2015-03-21 MED ORDER — TRAMADOL HCL 50 MG PO TABS: ORAL_TABLET | ORAL | Status: DC

## 2015-03-21 NOTE — Progress Notes (Signed)
Subjective:    Patient ID: Lawrence Coffee Sr., male    DOB: 28-Jan-1932, 81 y.o.   MRN: [id number]  HPI  The patient is an 81 year old gentleman who returns to pain management Center for further evaluation and treatment of pain involving the lower back and lower extremity regions. The patient is very happy regarding his response to lumbar epidural steroid injection performed at time of previous visit to pain management Center. The patient states that he is doing remarkably well and his caregiver agrees. We will avoid interventional treatment at this time and will continue to observe patient's response to lumbar epidural steroid injection. Patient will continue his medication tramadol and we will remain available to consider modification of treatment regimen should they be change in patient's condition prior to scheduled return appointment. The patient and his caregiver were with understanding and agreement suggested treatment plan. The patient and his caregiver state that he is able to perform activities of daily living as well as obtaining restful sleep without any significant pain interfering with any activities of daily living or ability to obtain restful sleep. We will Prescribe tramadol for patient which patient uses. [place] and we will remain available to consider modifications of treatment pending follow-up evaluation. The patient was cautioned regarding respiratory depression excessive sedation confusion and other side effects which can occur with tramadol. The patient has experienced taking tramadol and denies any of the side effects.    Review of Systems     Objective:   Physical Exam   There was tenderness to palpation of paraspinal muscular region cervical region cervical facet region palpation which reproduces minimal discomfort. There was minimal tense to palpation over the region of the acromioclavicular and glenohumeral joint regions. Patient appeared to be with bilaterally  equal grip strength. Tinel and Phalen's maneuver were without increased pain of significant degree. Palpation over the thoracic facet thoracic paraspinal musculature region was with minimal tenderness to palpation with minimal muscle spasms. Palpation over the lumbar paraspinal must reason lumbar facet region was with minimal discomfort. Lateral bending rotation extension and palpation of the lumbar facets reproduced minimal discomfort. Straight leg raising was tolerates approximately 30 and no increased pain was noted with dorsiflexion noted. There was mild to moderate tenderness to palpation over the PSIS and PII S region as well as the gluteal and piriformis musculature regions. Palpation of the greater trochanteric region iliotibial band region was without excessive tends to palpation. No sensory deficit or dermatomal distribution of the lower extremities was detected. There was negative clonus negative clonus and negative Homans. Abdomen was nontender with no costovertebral tenderness noted     .    Assessment & Plan:    MRI has revealed the patient to be with evidence  of prior posterior decompression L2-3 and L3 for relieving the central stenosis seen on prior exam. Foramina at both levels appear with little change with mild to moderate stenosis. Patient L1 to degenerative disc disease with mild central stenosis associated with broad-based disc bulging and facet hypertrophy. Retrolisthesis L1 on L2, L2 on L3, and L3 on L4, all grade 1. L4-5 and L5-S1 levels with moderate foraminal stenosis and moderate bilateral foraminal stenosis  Lumbar radiculopathy  Lumbar facet syndrome  Lumbar stenosis with neurogenic claudication  Sacroiliac joint dysfunction    PLAN  Continue present medication tramadol as prescribed. Tramadol can cause respiratory depression, excessive sedation, confusion and other side effects. Exercise extreme caution when taking tramadol  F/U PCP Dr. Manuella Ghazi for evaliation  of  BP and general medical  condition.  F/U surgical evaluation. May consider pending follow-up evaluations. We will avoid surgical evaluation at this time  F/U neurological evaluation. May consider pending follow-up evaluations  May consider radiofrequency rhizolysis or intraspinal procedures pending response to present treatment and F/U evaluation.. We will avoid considering surgical treatment at this time  Patient to call Pain Management Center should patient have concerns prior to scheduled return appointment.

## 2015-03-21 NOTE — Patient Instructions (Addendum)
PLAN  Continue present medication tramadol as prescribed. Tramadol can cause respiratory depression, excessive sedation, confusion and other side effects. Exercise extreme caution when taking tramadol  F/U PCP Dr. Manuella Ghazi for evaliation of  BP and general medical  condition.  F/U surgical evaluation. May consider pending follow-up evaluations. We will avoid surgical evaluation at this time  F/U neurological evaluation. May consider pending follow-up evaluations  May consider radiofrequency rhizolysis or intraspinal procedures pending response to present treatment and F/U evaluation.  Patient to call Pain Management Center should patient have concerns prior to scheduled return appointment.

## 2015-03-21 NOTE — Progress Notes (Signed)
Safety precautions to be maintained throughout the outpatient stay will include: orient to surroundings, keep bed in low position, maintain call bell within reach at all times, provide assistance with transfer out of bed and ambulation.  

## 2015-03-25 ENCOUNTER — Other Ambulatory Visit: Payer: Self-pay | Admitting: Family Medicine

## 2015-04-05 ENCOUNTER — Other Ambulatory Visit: Payer: Self-pay | Admitting: Family Medicine

## 2015-04-16 DIAGNOSIS — Z8679 Personal history of other diseases of the circulatory system: Secondary | ICD-10-CM | POA: Insufficient documentation

## 2015-04-16 DIAGNOSIS — F3341 Major depressive disorder, recurrent, in partial remission: Secondary | ICD-10-CM | POA: Diagnosis not present

## 2015-04-16 DIAGNOSIS — G20 Parkinson's disease: Secondary | ICD-10-CM | POA: Diagnosis not present

## 2015-04-16 DIAGNOSIS — I10 Essential (primary) hypertension: Secondary | ICD-10-CM | POA: Diagnosis not present

## 2015-04-16 DIAGNOSIS — E039 Hypothyroidism, unspecified: Secondary | ICD-10-CM | POA: Diagnosis not present

## 2015-04-16 DIAGNOSIS — E7800 Pure hypercholesterolemia, unspecified: Secondary | ICD-10-CM | POA: Diagnosis not present

## 2015-04-16 DIAGNOSIS — M5441 Lumbago with sciatica, right side: Secondary | ICD-10-CM | POA: Diagnosis not present

## 2015-04-16 DIAGNOSIS — G8929 Other chronic pain: Secondary | ICD-10-CM | POA: Diagnosis not present

## 2015-04-18 ENCOUNTER — Ambulatory Visit: Payer: PPO | Admitting: Pain Medicine

## 2015-04-18 DIAGNOSIS — R400 Somnolence: Secondary | ICD-10-CM | POA: Diagnosis not present

## 2015-04-22 ENCOUNTER — Other Ambulatory Visit: Payer: Self-pay | Admitting: Family Medicine

## 2015-05-18 ENCOUNTER — Other Ambulatory Visit: Payer: Self-pay | Admitting: Urology

## 2015-07-02 DIAGNOSIS — R001 Bradycardia, unspecified: Secondary | ICD-10-CM | POA: Diagnosis not present

## 2015-07-05 ENCOUNTER — Other Ambulatory Visit: Payer: Self-pay

## 2015-07-05 NOTE — Telephone Encounter (Signed)
Got a fax from [facility] requesting a refill of this patient's Claritin & Flonase.  Refill request was sent to Dr. Rochel Brome for approval and submission.

## 2015-07-08 MED ORDER — FLUTICASONE PROPIONATE 50 MCG/ACT NA SUSP: 2.0000 | Freq: Every day | NASAL | Status: DC

## 2015-07-08 MED ORDER — LORATADINE 10 MG PO TABS: 10.0000 mg | ORAL_TABLET | Freq: Every day | ORAL | Status: DC

## 2015-07-17 DIAGNOSIS — E039 Hypothyroidism, unspecified: Secondary | ICD-10-CM | POA: Diagnosis not present

## 2015-07-17 DIAGNOSIS — D649 Anemia, unspecified: Secondary | ICD-10-CM | POA: Insufficient documentation

## 2015-07-17 DIAGNOSIS — R05 Cough: Secondary | ICD-10-CM | POA: Diagnosis not present

## 2015-07-17 DIAGNOSIS — E7800 Pure hypercholesterolemia, unspecified: Secondary | ICD-10-CM | POA: Diagnosis not present

## 2015-07-17 DIAGNOSIS — I10 Essential (primary) hypertension: Secondary | ICD-10-CM | POA: Diagnosis not present

## 2015-10-03 ENCOUNTER — Other Ambulatory Visit: Payer: Self-pay | Admitting: Family Medicine

## 2015-10-23 ENCOUNTER — Other Ambulatory Visit: Payer: Self-pay | Admitting: Family Medicine

## 2015-10-31 DIAGNOSIS — D649 Anemia, unspecified: Secondary | ICD-10-CM | POA: Diagnosis not present

## 2015-10-31 DIAGNOSIS — E039 Hypothyroidism, unspecified: Secondary | ICD-10-CM | POA: Diagnosis not present

## 2015-10-31 DIAGNOSIS — I10 Essential (primary) hypertension: Secondary | ICD-10-CM | POA: Diagnosis not present

## 2015-10-31 DIAGNOSIS — E7800 Pure hypercholesterolemia, unspecified: Secondary | ICD-10-CM | POA: Diagnosis not present

## 2015-11-07 DIAGNOSIS — Z23 Encounter for immunization: Secondary | ICD-10-CM | POA: Diagnosis not present

## 2015-11-07 DIAGNOSIS — E7800 Pure hypercholesterolemia, unspecified: Secondary | ICD-10-CM | POA: Diagnosis not present

## 2015-11-07 DIAGNOSIS — Z0000 Encounter for general adult medical examination without abnormal findings: Secondary | ICD-10-CM | POA: Diagnosis not present

## 2015-11-07 DIAGNOSIS — I10 Essential (primary) hypertension: Secondary | ICD-10-CM | POA: Diagnosis not present

## 2015-11-07 DIAGNOSIS — E039 Hypothyroidism, unspecified: Secondary | ICD-10-CM | POA: Diagnosis not present

## 2015-11-07 DIAGNOSIS — Z862 Personal history of diseases of the blood and blood-forming organs and certain disorders involving the immune mechanism: Secondary | ICD-10-CM | POA: Insufficient documentation

## 2015-11-27 ENCOUNTER — Other Ambulatory Visit: Payer: Self-pay | Admitting: Family Medicine

## 2015-11-29 ENCOUNTER — Other Ambulatory Visit: Payer: Self-pay | Admitting: Family Medicine

## 2015-12-19 DIAGNOSIS — R0789 Other chest pain: Secondary | ICD-10-CM | POA: Diagnosis not present

## 2015-12-19 DIAGNOSIS — I495 Sick sinus syndrome: Secondary | ICD-10-CM | POA: Diagnosis not present

## 2015-12-19 DIAGNOSIS — I10 Essential (primary) hypertension: Secondary | ICD-10-CM | POA: Diagnosis not present

## 2015-12-19 DIAGNOSIS — G8929 Other chronic pain: Secondary | ICD-10-CM | POA: Diagnosis not present

## 2015-12-19 DIAGNOSIS — M5441 Lumbago with sciatica, right side: Secondary | ICD-10-CM | POA: Diagnosis not present

## 2015-12-19 DIAGNOSIS — E7800 Pure hypercholesterolemia, unspecified: Secondary | ICD-10-CM | POA: Diagnosis not present

## 2016-02-18 ENCOUNTER — Other Ambulatory Visit: Payer: Self-pay | Admitting: Family Medicine

## 2016-03-03 DIAGNOSIS — Z862 Personal history of diseases of the blood and blood-forming organs and certain disorders involving the immune mechanism: Secondary | ICD-10-CM | POA: Diagnosis not present

## 2016-03-03 DIAGNOSIS — I10 Essential (primary) hypertension: Secondary | ICD-10-CM | POA: Diagnosis not present

## 2016-03-03 DIAGNOSIS — E039 Hypothyroidism, unspecified: Secondary | ICD-10-CM | POA: Diagnosis not present

## 2016-03-03 DIAGNOSIS — E7800 Pure hypercholesterolemia, unspecified: Secondary | ICD-10-CM | POA: Diagnosis not present

## 2016-03-10 DIAGNOSIS — E7800 Pure hypercholesterolemia, unspecified: Secondary | ICD-10-CM | POA: Diagnosis not present

## 2016-03-10 DIAGNOSIS — E039 Hypothyroidism, unspecified: Secondary | ICD-10-CM | POA: Diagnosis not present

## 2016-03-10 DIAGNOSIS — Z862 Personal history of diseases of the blood and blood-forming organs and certain disorders involving the immune mechanism: Secondary | ICD-10-CM | POA: Diagnosis not present

## 2016-03-10 DIAGNOSIS — I10 Essential (primary) hypertension: Secondary | ICD-10-CM | POA: Diagnosis not present

## 2016-05-05 DIAGNOSIS — G40909 Epilepsy, unspecified, not intractable, without status epilepticus: Secondary | ICD-10-CM | POA: Insufficient documentation

## 2016-05-05 DIAGNOSIS — R5382 Chronic fatigue, unspecified: Secondary | ICD-10-CM | POA: Diagnosis not present

## 2016-05-05 DIAGNOSIS — M545 Low back pain: Secondary | ICD-10-CM

## 2016-05-05 DIAGNOSIS — R413 Other amnesia: Secondary | ICD-10-CM | POA: Diagnosis not present

## 2016-05-05 DIAGNOSIS — R569 Unspecified convulsions: Secondary | ICD-10-CM | POA: Diagnosis not present

## 2016-05-05 DIAGNOSIS — G479 Sleep disorder, unspecified: Secondary | ICD-10-CM | POA: Diagnosis not present

## 2016-05-05 DIAGNOSIS — F4321 Adjustment disorder with depressed mood: Secondary | ICD-10-CM | POA: Diagnosis not present

## 2016-05-05 DIAGNOSIS — G20 Parkinson's disease: Secondary | ICD-10-CM | POA: Diagnosis not present

## 2016-05-05 DIAGNOSIS — G8929 Other chronic pain: Secondary | ICD-10-CM | POA: Insufficient documentation

## 2016-05-05 DIAGNOSIS — R262 Difficulty in walking, not elsewhere classified: Secondary | ICD-10-CM | POA: Diagnosis not present

## 2016-05-13 DIAGNOSIS — G20 Parkinson's disease: Secondary | ICD-10-CM | POA: Diagnosis not present

## 2016-05-13 DIAGNOSIS — R413 Other amnesia: Secondary | ICD-10-CM | POA: Diagnosis not present

## 2016-05-13 DIAGNOSIS — M545 Low back pain: Secondary | ICD-10-CM | POA: Diagnosis not present

## 2016-05-13 DIAGNOSIS — R569 Unspecified convulsions: Secondary | ICD-10-CM | POA: Diagnosis not present

## 2016-05-13 DIAGNOSIS — R262 Difficulty in walking, not elsewhere classified: Secondary | ICD-10-CM | POA: Diagnosis not present

## 2016-05-13 DIAGNOSIS — G8929 Other chronic pain: Secondary | ICD-10-CM | POA: Diagnosis not present

## 2016-05-15 DIAGNOSIS — R569 Unspecified convulsions: Secondary | ICD-10-CM | POA: Diagnosis not present

## 2016-06-09 ENCOUNTER — Ambulatory Visit: Payer: PPO

## 2016-06-16 ENCOUNTER — Ambulatory Visit: Payer: PPO

## 2016-06-18 ENCOUNTER — Ambulatory Visit: Payer: PPO

## 2016-06-23 ENCOUNTER — Ambulatory Visit: Payer: PPO

## 2016-06-25 ENCOUNTER — Ambulatory Visit: Payer: PPO

## 2016-06-25 DIAGNOSIS — R8290 Unspecified abnormal findings in urine: Secondary | ICD-10-CM | POA: Diagnosis not present

## 2016-06-25 DIAGNOSIS — D649 Anemia, unspecified: Secondary | ICD-10-CM | POA: Diagnosis not present

## 2016-06-25 DIAGNOSIS — Z862 Personal history of diseases of the blood and blood-forming organs and certain disorders involving the immune mechanism: Secondary | ICD-10-CM | POA: Diagnosis not present

## 2016-06-25 DIAGNOSIS — F3341 Major depressive disorder, recurrent, in partial remission: Secondary | ICD-10-CM | POA: Diagnosis not present

## 2016-06-25 DIAGNOSIS — E7800 Pure hypercholesterolemia, unspecified: Secondary | ICD-10-CM | POA: Diagnosis not present

## 2016-06-25 DIAGNOSIS — R3989 Other symptoms and signs involving the genitourinary system: Secondary | ICD-10-CM | POA: Diagnosis not present

## 2016-06-25 DIAGNOSIS — E039 Hypothyroidism, unspecified: Secondary | ICD-10-CM | POA: Diagnosis not present

## 2016-06-25 DIAGNOSIS — I952 Hypotension due to drugs: Secondary | ICD-10-CM | POA: Diagnosis not present

## 2016-06-25 DIAGNOSIS — I10 Essential (primary) hypertension: Secondary | ICD-10-CM | POA: Diagnosis not present

## 2016-06-29 DIAGNOSIS — R8290 Unspecified abnormal findings in urine: Secondary | ICD-10-CM | POA: Diagnosis not present

## 2016-06-29 DIAGNOSIS — R3989 Other symptoms and signs involving the genitourinary system: Secondary | ICD-10-CM | POA: Diagnosis not present

## 2016-06-30 ENCOUNTER — Ambulatory Visit: Payer: PPO

## 2016-07-02 ENCOUNTER — Ambulatory Visit: Payer: PPO

## 2016-07-05 NOTE — Progress Notes (Signed)
07/06/2016 10:30 AM   Lawrence Coffee Sr. 20-Nov-1931 [id number]  Referring provider: Roselee Nova, MD [address] [place], [place] [address]  Chief Complaint  Patient presents with  . Urinary Incontinence    last seen 12/2014  dark urine     HPI: Patient is an 83 year old Caucasian male who presents today requesting an appointment for dark-colored urine and incontinence with his wife, Lawrence Valdez.    He was last seen in our office in October 2016 by Dr. Tresa Moore. At that time, he was doing well.  He had recently undergone a circumcision for phimosis. He also had KTP and was taking tamsulosin and finasteride and did not have voiding issues.  His IPSS score today is 14, which is moderate lower urinary tract symptomatology. He is terrible with his quality life due to his urinary symptoms.  His PVR is 91 mL.     His major complaints today are nocturia, incontinence, dark urine and a possible urinary tract infection.  His wife states that he is taking gummy vitamins and this may explain the discoloration in his urine. He has had these symptoms for several years.  He denies any dysuria, hematuria or suprapubic pain.   He also denies any recent fevers, chills, nausea or vomiting.       IPSS    Row Name 07/06/16 1000         International Prostate Symptom Score   How often have you had the sensation of not emptying your bladder? Not at All     How often have you had to urinate less than every two hours? Almost always     How often have you found you stopped and started again several times when you urinated? Not at All     How often have you found it difficult to postpone urination? Almost always     How often have you had a weak urinary stream? Not at All     How often have you had to strain to start urination? Not at All     How many times did you typically get up at night to urinate? 4 Times     Total IPSS Score 14       Quality of Life due to urinary symptoms   If you were to spend the rest of your life with your urinary condition just the way it is now how would you feel about that? Terrible        Score:  1-7 Mild 8-19 Moderate 20-35 Severe  He does have a prior history of recurrent urinary tract infections and nephrolithiasis.  He underwent cystolitholapaxy of a 2.5 cm bladder calculus in 2016.    His UA today is unremarkable.  He is not experiencing any suprapubic pain, abdominal pain or flank pain. He had a non contrast CT in 2015 which did not demonstrate any GU findings.  UA at PCP's office on 06/29/2016 was negative.    He is drinking 3 large water bottles daily.    He is a former smoker, with a 1 ppd history.  Quit 50 years ago.  He is not exposed to secondhand smoke.  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     PMH: Past Medical History:  Diagnosis Date  . Arthritis   . Bradycardia   . Chronic kidney disease    enlarged prostrate  . Depression   . Dysrhythmia   . Hypertension   . Hypothyroid   .  Hypothyroidism   . Pacemaker   . Vertigo     Surgical History: Past Surgical History:  Procedure Laterality Date  . APPENDECTOMY    . CARDIAC CATHETERIZATION     09  . CATARACT EXTRACTION    . HERNIA REPAIR  20 years ago   umbiluical   . HERNIA REPAIR Left 07-25-13   inguinal  . INSERT / REPLACE / REMOVE PACEMAKER     dr Ilona Sorrel     09  . JOINT REPLACEMENT     lt knee  . JOINT REPLACEMENT     right hip  . KNEE SURGERY Left   . LUMBAR LAMINECTOMY/DECOMPRESSION MICRODISCECTOMY  08/06/2011   Procedure: LUMBAR LAMINECTOMY/DECOMPRESSION MICRODISCECTOMY 2 LEVELS;  Surgeon: Eustace Moore, MD;  Location: [facility];  Service: Neurosurgery;  Laterality: N/A;  Lumbar two three, lumbar three four decompressive laminectomy  . PARTIAL HIP ARTHROPLASTY Right 2014  . SHOULDER SURGERY Right   . thumbs     trigger thumbs    Home Medications:  Allergies as of 07/06/2016      Reactions    No Known Allergies    Adhesive [tape] Itching, Other (See Comments)   blisters      Medication List       Accurate as of 07/06/16 10:30 AM. Always use your most recent med list.          aspirin EC 325 MG tablet Take 325 mg by mouth daily.   bisacodyl 5 MG EC tablet Commonly known as:  DULCOLAX Take by mouth.   carbidopa-levodopa 50-200 MG tablet Commonly known as:  SINEMET CR Take 1 tablet by mouth 2 (two) times daily.   cefUROXime 250 MG tablet Commonly known as:  CEFTIN Take 1 tablet (250 mg total) by mouth 2 (two) times daily with a meal.   cefUROXime 250 MG tablet Commonly known as:  CEFTIN Take 1 tablet (250 mg total) by mouth 2 (two) times daily with a meal.   cefUROXime 250 MG tablet Commonly known as:  CEFTIN Take 1 tablet (250 mg total) by mouth 2 (two) times daily with a meal.   cholecalciferol 1000 units tablet Commonly known as:  VITAMIN D Take 4,000 Units by mouth daily.   citalopram 10 MG tablet Commonly known as:  CELEXA Take 10 mg by mouth daily.   diclofenac sodium 1 % Gel Commonly known as:  VOLTAREN Apply topically.   finasteride 5 MG tablet Commonly known as:  PROSCAR TAKE 1 TABLET BY MOUTH EVERY DAY   Fish Oil 500 MG Caps Take 2 capsules by mouth daily.   fluticasone 50 MCG/ACT nasal spray Commonly known as:  FLONASE SHAKE LIQUID AND USE 2 SPRAYS IN EACH NOSTRIL DAILY   Inulin-Cholecalciferol 2.5-500 GM-UNIT Chew Chew by mouth.   lamoTRIgine 25 MG tablet Commonly known as:  LAMICTAL Wk1:1tab night wk2:1tab 2xday wk3:1tab AM 2tabsPM wk4:2tabs 2xday wk5:2tabs AM 3tabs PM wk6:3tabs 2xday wk7:3tabs AM 4tabs PM, wk8:4tab2xday   levothyroxine 75 MCG tablet Commonly known as:  SYNTHROID, LEVOTHROID TAKE 1 TABLET(75 MCG) BY MOUTH DAILY BEFORE BREAKFAST   lisinopril 10 MG tablet Commonly known as:  PRINIVIL,ZESTRIL Take 1 tablet (10 mg total) by mouth daily.   loratadine 10 MG tablet Commonly known as:  CLARITIN Take 1 tablet (10  mg total) by mouth daily.   meclizine 25 MG tablet Commonly known as:  ANTIVERT Take 25 mg by mouth 3 (three) times daily as needed. Reported on 03/21/2015   Melatonin 5  MG Tabs Take 1 tablet by mouth at bedtime. Reported on 03/21/2015   metoprolol succinate 25 MG 24 hr tablet Commonly known as:  TOPROL-XL TAKE 1 TABLET BY MOUTH EVERY DAY   multivitamin with minerals Tabs tablet Take 1 tablet by mouth daily.   naproxen sodium 220 MG tablet Commonly known as:  ANAPROX Take by mouth.   polyethylene glycol packet Commonly known as:  MIRALAX / GLYCOLAX Take 17 g by mouth at bedtime.   pravastatin 20 MG tablet Commonly known as:  PRAVACHOL Take 1 tablet (20 mg total) by mouth daily.   tamsulosin 0.4 MG Caps capsule Commonly known as:  FLOMAX Take 0.4 mg by mouth daily.   traMADol 50 MG tablet Commonly known as:  ULTRAM Limit one half to one tab by mouth per day or twice per day if tolerated   vitamin E 1000 UNIT capsule Take 1,000 Units by mouth daily.       Allergies:  Allergies  Allergen Reactions  . No Known Allergies   . Adhesive [Tape] Itching and Other (See Comments)    blisters    Family History: Family History  Problem Relation Age of Onset  . Prostate cancer Brother   . Kidney cancer Neg Hx   . Bladder Cancer Neg Hx     Social History:  reports that he quit smoking about 54 years ago. He has never used smokeless tobacco. He reports that he does not drink alcohol or use drugs.  ROS: UROLOGY Frequent Urination?: No Hard to postpone urination?: No Burning/pain with urination?: No Get up at night to urinate?: Yes Leakage of urine?: Yes Urine stream starts and stops?: No Trouble starting stream?: No Do you have to strain to urinate?: No Blood in urine?: Yes Urinary tract infection?: Yes Sexually transmitted disease?: No Injury to kidneys or bladder?: No Painful intercourse?: No Weak stream?: No Erection problems?: No Penile pain?:  No  Gastrointestinal Nausea?: No Vomiting?: No Indigestion/heartburn?: No Diarrhea?: No Constipation?: No  Constitutional Fever: No Night sweats?: Yes Weight loss?: Yes Fatigue?: Yes  Skin Skin rash/lesions?: No Itching?: No  Eyes Blurred vision?: No Double vision?: No  Ears/Nose/Throat Sore throat?: No Sinus problems?: No  Hematologic/Lymphatic Swollen glands?: No Easy bruising?: Yes  Cardiovascular Leg swelling?: No Chest pain?: No  Respiratory Cough?: No Shortness of breath?: No  Endocrine Excessive thirst?: No  Musculoskeletal Back pain?: Yes Joint pain?: Yes  Neurological Headaches?: No Dizziness?: No  Psychologic Depression?: Yes Anxiety?: Yes  Physical Exam: BP 135/82   Pulse 71   Ht 5\' 9"  (1.753 m)   Wt 179 lb 1.6 oz (81.2 kg)   BMI 26.45 kg/m   Constitutional: Well nourished. Alert and oriented, No acute distress. HEENT: [place] AT, moist mucus membranes. Trachea midline, no masses. Cardiovascular: No clubbing, cyanosis, or edema. Respiratory: Normal respiratory effort, no increased work of breathing. GI: Abdomen is soft, non tender, non distended, no abdominal masses. Liver and spleen not palpable.  No hernias appreciated.  Stool sample for occult testing is not indicated.   GU: No CVA tenderness.  No bladder fullness or masses.  Patient with circumcised phallus.   Urethral meatus is patent.  No penile discharge. No penile lesions or rashes. Scrotum without lesions, cysts, rashes and/or edema.  Testicles are located scrotally bilaterally. No masses are appreciated in the testicles. Left and right epididymis are normal. Rectal: Patient with  normal sphincter tone. Anus and perineum without scarring or rashes. No rectal masses are appreciated. Prostate is approximately  50 grams, no nodules are appreciated. Seminal vesicles are normal. Skin: No rashes, bruises or suspicious lesions. Lymph: No cervical or inguinal adenopathy. Neurologic:  Grossly intact, no focal deficits, moving all 4 extremities. Psychiatric: Normal mood and affect.  Laboratory Data: Lab Results  Component Value Date   WBC 6.2 06/19/2014   HGB 14.0 06/19/2014   HCT 41.2 06/19/2014   MCV 91 06/19/2014   PLT 143 (L) 06/19/2014    Lab Results  Component Value Date   CREATININE 1.02 10/11/2014    Lab Results  Component Value Date   TSH 1.870 11/29/2014       Component Value Date/Time   CHOL 168 10/11/2014 1141   CHOL 139 03/22/2012 0435   HDL 38 (L) 10/11/2014 1141   HDL 27 (L) 03/22/2012 0435   CHOLHDL 4.4 10/11/2014 1141   VLDL 22 03/22/2012 0435   LDLCALC 101 (H) 10/11/2014 1141   LDLCALC 90 03/22/2012 0435    Lab Results  Component Value Date   AST 19 10/11/2014   Lab Results  Component Value Date   ALT 4 10/11/2014     Urinalysis Unremarkable.  See EPIC.     Assessment & Plan:    1. History of bladder stone  - obtain KUB to rule out bladder stone as the cause of the dark urine  - I will contact patient with results  2. BPH with LUTS  - IPSS score is 14/6  - Continue conservative management, avoiding bladder irritants and timed voiding's  - most bothersome symptoms is/are incontinence - most likely due to his Parkinson's  - Continue tamsulosin 0.4 mg daily and finasteride 5 mg daily  - RTC prn  3. Dark colored urine  - No microscopic hematuria seen on today's UA or UA at PCPs office  - Patient will report if any gross hematuria with should occur  Return for I will call patient with results.  These notes generated with voice recognition software. I apologize for typographical errors.  Zara Council, [facility] [address] [place], [place] [address] [phone]

## 2016-07-06 ENCOUNTER — Encounter: Payer: Self-pay | Admitting: Urology

## 2016-07-06 ENCOUNTER — Ambulatory Visit (INDEPENDENT_AMBULATORY_CARE_PROVIDER_SITE_OTHER): Payer: PPO | Admitting: Urology

## 2016-07-06 ENCOUNTER — Ambulatory Visit
Admission: RE | Admit: 2016-07-06 | Discharge: 2016-07-06 | Disposition: A | Discharge status: Home / Self Care | Payer: PPO | Source: Ambulatory Visit | Attending: Urology | Admitting: Urology

## 2016-07-06 VITALS — BP 135/82 | HR 71 | Ht 69.0 in | Wt 179.1 lb

## 2016-07-06 DIAGNOSIS — R32 Unspecified urinary incontinence: Secondary | ICD-10-CM | POA: Diagnosis not present

## 2016-07-06 DIAGNOSIS — Z96641 Presence of right artificial hip joint: Secondary | ICD-10-CM | POA: Diagnosis not present

## 2016-07-06 DIAGNOSIS — K5900 Constipation, unspecified: Secondary | ICD-10-CM | POA: Insufficient documentation

## 2016-07-06 DIAGNOSIS — Z87448 Personal history of other diseases of urinary system: Secondary | ICD-10-CM | POA: Diagnosis not present

## 2016-07-06 DIAGNOSIS — R109 Unspecified abdominal pain: Secondary | ICD-10-CM | POA: Insufficient documentation

## 2016-07-06 DIAGNOSIS — R3989 Other symptoms and signs involving the genitourinary system: Secondary | ICD-10-CM

## 2016-07-06 LAB — URINALYSIS, COMPLETE
Bilirubin, UA: NEGATIVE
GLUCOSE, UA: NEGATIVE
Ketones, UA: NEGATIVE
Leukocytes, UA: NEGATIVE
NITRITE UA: NEGATIVE
Protein, UA: NEGATIVE
Specific Gravity, UA: 1.015 (ref 1.005–1.030)
Urobilinogen, Ur: 0.2 mg/dL (ref 0.2–1.0)
pH, UA: 5.5 (ref 5.0–7.5)

## 2016-07-06 LAB — MICROSCOPIC EXAMINATION: RBC MICROSCOPIC, UA: NONE SEEN /HPF (ref 0–?)

## 2016-07-06 LAB — BLADDER SCAN AMB NON-IMAGING: Scan Result: 91

## 2016-07-07 ENCOUNTER — Ambulatory Visit: Payer: PPO

## 2016-07-07 ENCOUNTER — Telehealth: Payer: Self-pay

## 2016-07-07 NOTE — Telephone Encounter (Signed)
Spoke with pt wife in reference to xray results and constipation. Wife voiced understanding.

## 2016-07-07 NOTE — Telephone Encounter (Signed)
-----   Message from Nori Riis, PA-C sent at 07/06/2016  1:44 PM EDT ----- Please with the patient know that his x-ray was negative for stones. He does have constipation.

## 2016-07-09 ENCOUNTER — Ambulatory Visit: Payer: PPO

## 2016-07-14 ENCOUNTER — Ambulatory Visit: Payer: PPO

## 2016-07-16 ENCOUNTER — Ambulatory Visit: Payer: PPO

## 2016-07-21 ENCOUNTER — Ambulatory Visit: Payer: PPO

## 2016-07-23 ENCOUNTER — Ambulatory Visit: Payer: PPO

## 2016-08-01 ENCOUNTER — Encounter: Payer: Self-pay | Admitting: Emergency Medicine

## 2016-08-01 ENCOUNTER — Emergency Department: Payer: PPO

## 2016-08-01 ENCOUNTER — Emergency Department
Admission: EM | Admit: 2016-08-01 | Discharge: 2016-08-01 | Disposition: A | Discharge status: Home / Self Care | Payer: PPO | Attending: Emergency Medicine | Admitting: Emergency Medicine

## 2016-08-01 DIAGNOSIS — N189 Chronic kidney disease, unspecified: Secondary | ICD-10-CM | POA: Diagnosis not present

## 2016-08-01 DIAGNOSIS — Z79899 Other long term (current) drug therapy: Secondary | ICD-10-CM | POA: Insufficient documentation

## 2016-08-01 DIAGNOSIS — R410 Disorientation, unspecified: Secondary | ICD-10-CM | POA: Diagnosis not present

## 2016-08-01 DIAGNOSIS — G20 Parkinson's disease: Secondary | ICD-10-CM | POA: Diagnosis not present

## 2016-08-01 DIAGNOSIS — G4710 Hypersomnia, unspecified: Secondary | ICD-10-CM | POA: Diagnosis not present

## 2016-08-01 DIAGNOSIS — I129 Hypertensive chronic kidney disease with stage 1 through stage 4 chronic kidney disease, or unspecified chronic kidney disease: Secondary | ICD-10-CM | POA: Insufficient documentation

## 2016-08-01 DIAGNOSIS — Z7982 Long term (current) use of aspirin: Secondary | ICD-10-CM | POA: Diagnosis not present

## 2016-08-01 DIAGNOSIS — R400 Somnolence: Secondary | ICD-10-CM

## 2016-08-01 DIAGNOSIS — R4182 Altered mental status, unspecified: Secondary | ICD-10-CM | POA: Diagnosis not present

## 2016-08-01 DIAGNOSIS — E039 Hypothyroidism, unspecified: Secondary | ICD-10-CM | POA: Insufficient documentation

## 2016-08-01 DIAGNOSIS — Z87891 Personal history of nicotine dependence: Secondary | ICD-10-CM | POA: Insufficient documentation

## 2016-08-01 LAB — GLUCOSE, CAPILLARY: Glucose-Capillary: 140 mg/dL — ABNORMAL HIGH (ref 65–99)

## 2016-08-01 LAB — COMPREHENSIVE METABOLIC PANEL
ALBUMIN: 3.8 g/dL (ref 3.5–5.0)
ALK PHOS: 49 U/L (ref 38–126)
AST: 18 U/L (ref 15–41)
Anion gap: 9 (ref 5–15)
BILIRUBIN TOTAL: 0.8 mg/dL (ref 0.3–1.2)
BUN: 12 mg/dL (ref 6–20)
CO2: 25 mmol/L (ref 22–32)
Calcium: 8.9 mg/dL (ref 8.9–10.3)
Chloride: 106 mmol/L (ref 101–111)
Creatinine, Ser: 0.95 mg/dL (ref 0.61–1.24)
GFR calc Af Amer: >60 mL/min (ref >60)
GLUCOSE: 131 mg/dL — AB (ref 65–99)
Potassium: 3.4 mmol/L — ABNORMAL LOW (ref 3.5–5.1)
Sodium: 140 mmol/L (ref 135–145)
TOTAL PROTEIN: 6.8 g/dL (ref 6.5–8.1)

## 2016-08-01 LAB — CBC
HEMATOCRIT: 39.1 % — AB (ref 40.0–52.0)
HEMOGLOBIN: 13.2 g/dL (ref 13.0–18.0)
MCH: 29.6 pg (ref 26.0–34.0)
MCHC: 33.7 g/dL (ref 32.0–36.0)
MCV: 87.7 fL (ref 80.0–100.0)
Platelets: 138 10*3/uL — ABNORMAL LOW (ref 150–440)
RBC: 4.46 MIL/uL (ref 4.40–5.90)
RDW: 13.9 % (ref 11.5–14.5)
WBC: 5.6 10*3/uL (ref 3.8–10.6)

## 2016-08-01 LAB — TROPONIN I: Troponin I: <0.03 ng/mL (ref <0.03)

## 2016-08-01 NOTE — ED Notes (Addendum)
Caregiver states pt had "dark red urine" this morning.

## 2016-08-01 NOTE — ED Notes (Addendum)
Pt states he is sleepy.  Caregiver says it is possibly related to the new seizure medication.  Pt falls asleep easily and has not been acting like himself.  Caregiver states his tremors and shaking is getting worse.

## 2016-08-01 NOTE — ED Notes (Addendum)
Caregiver states pt has Parkinson's

## 2016-08-01 NOTE — ED Notes (Signed)
Pt unable to void at this time. 

## 2016-08-01 NOTE — ED Provider Notes (Signed)
[facility] Emergency Department Provider Note ____________________________________________   I have reviewed the triage vital signs and the triage nursing note.  HISTORY  Chief Complaint Altered Mental Status   Historian Patient is a historian due to altered mental status/ongoing Parkinson's Wife provides the history  HPI Lawrence OATIS Sr. is a 83 y.o. male with a history of Parkinson's, being cared for at home by his wife of 27 years, brought in by wife for altered mental status which is described as sleepiness or not as alert as he typically is which has been progressing over a matter of several weeks. It sounds like he had a seizure which was felt to be related to Parkinson's. He did not have head imaging at that point time. He is currently having new medication lamotrigine increased [place] dosing at which Is they will be tapering off prior seizure medication.  She does not know what the prior seizure medication is, and the Epic chart history does not note any additional seizure medication.  Indicates is been excessively sleepy and not a week very much during the day over the past 5 weeks since the addition of the new medication.  Medications, she thinks it's possible it could be due to his Parkinson's disease getting worse over time, but she wants to make sure that we are not missing any other underlying cause of the altered mental status.  Decreased by mouth intake due to sleepiness.  Denies urinary symptoms although occasionally the urine from overnight in the urine was dark.    Past Medical History:  Diagnosis Date  . Arthritis   . Bradycardia   . Chronic kidney disease    enlarged prostrate  . Depression   . Dysrhythmia   . Hypertension   . Hypothyroid   . Hypothyroidism   . Pacemaker   . Vertigo     Patient Active Problem List   Diagnosis Date Noted  . Chest wall pain 11/29/2014  . Hypertension 10/11/2014  . Hyperlipidemia 10/11/2014   . Idiopathic Parkinson's disease ([place]) 05/02/2014  . Leg pain, right 05/02/2014  . Back pain, chronic 12/28/2013  . Clinical depression 08/29/2013  . Has a tremor 08/29/2013  . Mechanical and motor problems with internal organs 08/29/2013  . Disordered sleep 08/29/2013  . Amnesia 08/29/2013  . Fatigue 08/29/2013  . Fall 08/29/2013  . Dizziness 08/29/2013  . Difficulty in walking 08/29/2013  . Adult hypothyroidism 06/16/2013  . Acquired trigger finger 06/16/2013  . Arrhythmia, sinus node 06/16/2013  . HLD (hyperlipidemia) 06/16/2013  . Benign essential HTN 06/16/2013    Past Surgical History:  Procedure Laterality Date  . APPENDECTOMY    . CARDIAC CATHETERIZATION     09  . CATARACT EXTRACTION    . HERNIA REPAIR  20 years ago   umbiluical   . HERNIA REPAIR Left 07-25-13   inguinal  . INSERT / REPLACE / REMOVE PACEMAKER     dr Ilona Sorrel     09  . JOINT REPLACEMENT     lt knee  . JOINT REPLACEMENT     right hip  . KNEE SURGERY Left   . LUMBAR LAMINECTOMY/DECOMPRESSION MICRODISCECTOMY  08/06/2011   Procedure: LUMBAR LAMINECTOMY/DECOMPRESSION MICRODISCECTOMY 2 LEVELS;  Surgeon: Eustace Moore, MD;  Location: [facility];  Service: Neurosurgery;  Laterality: N/A;  Lumbar two three, lumbar three four decompressive laminectomy  . PARTIAL HIP ARTHROPLASTY Right 2014  . SHOULDER SURGERY Right   . thumbs     trigger  thumbs    Prior to Admission medications   Medication Sig Start Date End Date Taking? Authorizing Provider  aspirin EC 325 MG tablet Take 325 mg by mouth daily.    [provider]  bisacodyl (DULCOLAX) 5 MG EC tablet Take by mouth.    [provider]  carbidopa-levodopa (SINEMET CR) 50-200 MG per tablet Take 1 tablet by mouth 2 (two) times daily.    [provider]  cefUROXime (CEFTIN) 250 MG tablet Take 1 tablet (250 mg total) by mouth 2 (two) times daily with a meal. Patient not taking: Reported on 01/09/2015 12/19/14   Mohammed Kindle, MD  cefUROXime (CEFTIN) 250 MG tablet Take 1 tablet (250 mg total) by mouth 2 (two) times daily with a meal. Patient not taking: Reported on 02/12/2015 01/09/15   Mohammed Kindle, MD  cefUROXime (CEFTIN) 250 MG tablet Take 1 tablet (250 mg total) by mouth 2 (two) times daily with a meal. Patient not taking: Reported on 03/21/2015 02/27/15   Mohammed Kindle, MD  cholecalciferol (VITAMIN D) 1000 UNITS tablet Take 4,000 Units by mouth daily.    [provider]  citalopram (CELEXA) 10 MG tablet Take 10 mg by mouth daily.    [provider]  diclofenac sodium (VOLTAREN) 1 % GEL Apply topically.    [provider]  finasteride (PROSCAR) 5 MG tablet TAKE 1 TABLET BY MOUTH EVERY DAY 05/21/15   McGowan, Larene Beach A, PA-C  fluticasone (FLONASE) 50 MCG/ACT nasal spray SHAKE LIQUID AND USE 2 SPRAYS IN EACH NOSTRIL DAILY 11/29/15   Roselee Nova, MD  Inulin-Cholecalciferol 2.5-500 GM-UNIT CHEW Chew by mouth.    [provider]  lamoTRIgine (LAMICTAL) 25 MG tablet Wk1:1tab night wk2:1tab 2xday wk3:1tab AM 2tabsPM wk4:2tabs 2xday wk5:2tabs AM 3tabs PM wk6:3tabs 2xday wk7:3tabs AM 4tabs PM, wk8:4tab2xday 06/24/16   [provider]  levothyroxine (SYNTHROID, LEVOTHROID) 75 MCG tablet TAKE 1 TABLET(75 MCG) BY MOUTH DAILY BEFORE BREAKFAST 10/28/15   Roselee Nova, MD  lisinopril (PRINIVIL,ZESTRIL) 10 MG tablet Take 1 tablet (10 mg total) by mouth daily. 01/24/15   Roselee Nova, MD  loratadine (CLARITIN) 10 MG tablet Take 1 tablet (10 mg total) by mouth daily. 07/08/15   Roselee Nova, MD  meclizine (ANTIVERT) 25 MG tablet Take 25 mg by mouth 3 (three) times daily as needed. Reported on 03/21/2015    [provider]  Melatonin 5 MG TABS Take 1 tablet by mouth at bedtime. Reported on 03/21/2015    [provider]  metoprolol succinate (TOPROL-XL) 25 MG 24 hr tablet TAKE 1 TABLET BY MOUTH EVERY DAY 12/17/14   [provider]  Multiple  Vitamin (MULITIVITAMIN WITH MINERALS) TABS Take 1 tablet by mouth daily.    [provider]  naproxen sodium (ANAPROX) 220 MG tablet Take by mouth.    [provider]  Omega-3 Fatty Acids (FISH OIL) 500 MG CAPS Take 2 capsules by mouth daily.    [provider]  polyethylene glycol (MIRALAX / GLYCOLAX) packet Take 17 g by mouth at bedtime.    [provider]  pravastatin (PRAVACHOL) 20 MG tablet Take 1 tablet (20 mg total) by mouth daily. 10/11/14   Roselee Nova, MD  Tamsulosin HCl (FLOMAX) 0.4 MG CAPS Take 0.4 mg by mouth daily.    [provider]  traMADol (ULTRAM) 50 MG tablet Limit one half to one tab by mouth per day or twice per day if tolerated 03/21/15  Mohammed Kindle, MD  vitamin E 1000 UNIT capsule Take 1,000 Units by mouth daily.    [provider]    Allergies  Allergen Reactions  . No Known Allergies   . Adhesive [Tape] Itching and Other (See Comments)    blisters    Family History  Problem Relation Age of Onset  . Prostate cancer Brother   . Kidney cancer Neg Hx   . Bladder Cancer Neg Hx     Social History Social History  Substance Use Topics  . Smoking status: Former Smoker    Quit date: 08/04/1961  . Smokeless tobacco: Never Used  . Alcohol use No     Comment: socially    Review of Systems  Constitutional: Negative for fever. Eyes: Negative for visual changes. ENT: Negative for sore throat. Cardiovascular: Negative for chest pain. Respiratory: Negative for shortness of breath. Gastrointestinal: Negative for abdominal pain, vomiting and diarrhea. Genitourinary: Negative for dysuria. Musculoskeletal: Negative for back pain. Skin: Negative for rash. Neurological: Negative for headache. 10 point Review of Systems otherwise negative ____________________________________________   PHYSICAL EXAM:  VITAL SIGNS: ED Triage Vitals  Enc Vitals Group     BP 08/01/16 1122 111/64     Pulse Rate 08/01/16  1122 64     Resp 08/01/16 1122 18     Temp 08/01/16 1122 98.2 F (36.8 C)     Temp Source 08/01/16 1122 Oral     SpO2 08/01/16 1122 96 %     Weight 08/01/16 1126 170 lb (77.1 kg)     Height 08/01/16 1126 5\' 6"  (1.676 m)     Head Circumference --      Peak Flow --      Pain Score 08/01/16 1124 0     Pain Loc --      Pain Edu? --      Excl. in [place]? --      Constitutional: Alert To voice and cooperative, but easily goes back to sleep. Well appearing and in no distress. HEENT   Head: Normocephalic and atraumatic.      Eyes: Conjunctivae are normal. PERRL. Normal extraocular movements.      Ears:         Nose: No congestion/rhinnorhea.   Mouth/Throat: Mucous membranes are mildly dry.   Neck: No stridor. Cardiovascular/Chest: Normal rate, regular rhythm.  No murmurs, rubs, or gallops. Respiratory: Normal respiratory effort without tachypnea nor retractions. Breath sounds are clear and equal bilaterally. No wheezes/rales/rhonchi. Gastrointestinal: Soft. No distention, no guarding, no rebound. Nontender.    Genitourinary/rectal:Deferred Musculoskeletal: Nontender with normal range of motion in all extremities. No joint effusions.  No lower extremity tenderness.  No edema. Neurologic:  Sleepy but arouses to voice and talks a few words. Normal speech and language. No gross or focal neurologic deficits are appreciated. Skin:  Skin is warm, dry and intact. No rash noted. Psychiatric: No agitation.   ____________________________________________  LABS (pertinent positives/negatives)  Labs Reviewed  COMPREHENSIVE METABOLIC PANEL - Abnormal; Notable for the following:       Result Value   Potassium 3.4 (*)    Glucose, Bld 131 (*)    ALT <5 (*)    All other components within normal limits  CBC - Abnormal; Notable for the following:    HCT 39.1 (*)    Platelets 138 (*)    All other components within normal limits  GLUCOSE, CAPILLARY - Abnormal; Notable for the following:     Glucose-Capillary 140 (*)    All  other components within normal limits  TROPONIN I  URINALYSIS, COMPLETE (UACMP) WITH MICROSCOPIC  CBG MONITORING, ED    ____________________________________________    EKG I, Lisa Roca, MD, the attending physician have personally viewed and interpreted all ECGs.  60 bpm. Atrially paced rhythm. Left axis deviation. Nonspecific ST-T wave ____________________________________________  RADIOLOGY All Xrays were viewed by me. Imaging interpreted by Radiologist.  CT head without contrast:  IMPRESSION: Chronic white matter changes.  No acute abnormalities. __________________________________________  PROCEDURES  Procedure(s) performed: None  Critical Care performed: None  ____________________________________________   ED COURSE / ASSESSMENT AND PLAN  Pertinent labs & imaging results that were available during my care of the patient were reviewed by me and considered in my medical decision making (see chart for details).   Mr. Leonetti is somewhat sleepy but arousable. He has no focal neurologic findings. I am ultimately most suspicious that his worsening over about a 5 weeks may be related to underlying progressive Parkinson's, versus medication side effect with the new Lamictal dosing.  In any case, I will obtain laboratory studies, urine, as well as head CT.  Laboratory studies are overall reassuring. Head CT shows no acute finding.  Patient is essentially hot and has been unable to provide irritable here.  I don't have a high suspicion for urinary tract infection. Wife does not have a high suspicion for urinary tract infection. She does not want to continue to wait and she does not want to do a catheter with him. It is okay.  Again I am most suspicious for progressive Parkinson's symptoms versus medication side effect and if it isn't erythematous or emergency department follow-up with his treating physician for Parkinson's and  seizure.    CONSULTATIONS: None   Patient / Family / Caregiver informed of clinical course, medical decision-making process, and agree with plan.   I discussed return precautions, follow-up instructions, and discharge instructions with patient and/or family.  Discharge instructions:  After evaluation for altered mental status and sleepiness, his evaluation is reassuring here in the emergency department stay.  I am most suspicious about side effect of seizure medication versus Parkinson's causing these symptoms. Please discuss further care doctor and/or neurologist. Return emergency department immediately for any worsening symptoms including worsening confusion altered mental status, fever, or any other symptoms concerning to you.  ___________________________________________   FINAL CLINICAL IMPRESSION(S) / ED DIAGNOSES   Final diagnoses:  Sleepiness  Altered mental status, unspecified altered mental status type              Note: This dictation was prepared with Dragon dictation. Any transcriptional errors that result from this process are unintentional    Lisa Roca, MD 08/01/16 1415

## 2016-08-01 NOTE — ED Triage Notes (Signed)
Patient presents to the ED with confusion x 1 week with increasing lethargy.  Patient's caregiver states that patient stayed in the bed all day yesterday and had a lot of difficulty opening his eyes and waking up this morning.  Patient's caregiver states he is on the 5th week of a new medication for seizures that they have been titrating up.  Patient is alert and oriented x 4 at this time.

## 2016-08-01 NOTE — Discharge Instructions (Signed)
After evaluation for altered mental status and sleepiness, his evaluation is reassuring here in the emergency department stay.  I am most suspicious about side effect of seizure medication versus Parkinson's causing these symptoms. Please discuss further care doctor and/or neurologist. Return emergency department immediately for any worsening symptoms including worsening confusion altered mental status, fever, or any other symptoms concerning to you.

## 2016-08-10 DIAGNOSIS — M79604 Pain in right leg: Secondary | ICD-10-CM | POA: Diagnosis not present

## 2016-08-10 DIAGNOSIS — G479 Sleep disorder, unspecified: Secondary | ICD-10-CM | POA: Diagnosis not present

## 2016-08-10 DIAGNOSIS — M545 Low back pain: Secondary | ICD-10-CM | POA: Diagnosis not present

## 2016-08-10 DIAGNOSIS — R569 Unspecified convulsions: Secondary | ICD-10-CM | POA: Diagnosis not present

## 2016-08-10 DIAGNOSIS — R262 Difficulty in walking, not elsewhere classified: Secondary | ICD-10-CM | POA: Diagnosis not present

## 2016-08-10 DIAGNOSIS — R413 Other amnesia: Secondary | ICD-10-CM | POA: Diagnosis not present

## 2016-08-10 DIAGNOSIS — G8929 Other chronic pain: Secondary | ICD-10-CM | POA: Diagnosis not present

## 2016-09-08 ENCOUNTER — Emergency Department: Payer: PPO

## 2016-09-08 ENCOUNTER — Emergency Department
Admission: EM | Admit: 2016-09-08 | Discharge: 2016-09-09 | Disposition: A | Discharge status: Other Acute Inpatient Hospital | Payer: PPO | Attending: Emergency Medicine | Admitting: Emergency Medicine

## 2016-09-08 DIAGNOSIS — I129 Hypertensive chronic kidney disease with stage 1 through stage 4 chronic kidney disease, or unspecified chronic kidney disease: Secondary | ICD-10-CM | POA: Insufficient documentation

## 2016-09-08 DIAGNOSIS — F329 Major depressive disorder, single episode, unspecified: Secondary | ICD-10-CM | POA: Insufficient documentation

## 2016-09-08 DIAGNOSIS — R45851 Suicidal ideations: Secondary | ICD-10-CM | POA: Insufficient documentation

## 2016-09-08 DIAGNOSIS — R4182 Altered mental status, unspecified: Secondary | ICD-10-CM | POA: Insufficient documentation

## 2016-09-08 DIAGNOSIS — M48061 Spinal stenosis, lumbar region without neurogenic claudication: Secondary | ICD-10-CM

## 2016-09-08 DIAGNOSIS — M5416 Radiculopathy, lumbar region: Secondary | ICD-10-CM

## 2016-09-08 DIAGNOSIS — Z96641 Presence of right artificial hip joint: Secondary | ICD-10-CM | POA: Diagnosis not present

## 2016-09-08 DIAGNOSIS — Z87891 Personal history of nicotine dependence: Secondary | ICD-10-CM | POA: Diagnosis not present

## 2016-09-08 DIAGNOSIS — M47816 Spondylosis without myelopathy or radiculopathy, lumbar region: Secondary | ICD-10-CM

## 2016-09-08 DIAGNOSIS — G20 Parkinson's disease: Secondary | ICD-10-CM

## 2016-09-08 DIAGNOSIS — M5136 Other intervertebral disc degeneration, lumbar region: Secondary | ICD-10-CM

## 2016-09-08 DIAGNOSIS — E039 Hypothyroidism, unspecified: Secondary | ICD-10-CM | POA: Diagnosis not present

## 2016-09-08 DIAGNOSIS — Z950 Presence of cardiac pacemaker: Secondary | ICD-10-CM | POA: Insufficient documentation

## 2016-09-08 DIAGNOSIS — N189 Chronic kidney disease, unspecified: Secondary | ICD-10-CM | POA: Insufficient documentation

## 2016-09-08 HISTORY — DX: Parkinson's disease: G20

## 2016-09-08 HISTORY — DX: Parkinson's disease without dyskinesia, without mention of fluctuations: G20.A1

## 2016-09-08 LAB — URINE DRUG SCREEN, QUALITATIVE (ARMC ONLY)
Amphetamines, Ur Screen: NOT DETECTED
BARBITURATES, UR SCREEN: NOT DETECTED
BENZODIAZEPINE, UR SCRN: NOT DETECTED
CANNABINOID 50 NG, UR ~~LOC~~: NOT DETECTED
COCAINE METABOLITE, UR ~~LOC~~: NOT DETECTED
MDMA (Ecstasy)Ur Screen: NOT DETECTED
Methadone Scn, Ur: NOT DETECTED
OPIATE, UR SCREEN: NOT DETECTED
PHENCYCLIDINE (PCP) UR S: NOT DETECTED
Tricyclic, Ur Screen: NOT DETECTED

## 2016-09-08 LAB — COMPREHENSIVE METABOLIC PANEL
AST: 24 U/L (ref 15–41)
Albumin: 3.7 g/dL (ref 3.5–5.0)
Alkaline Phosphatase: 45 U/L (ref 38–126)
Anion gap: 5 (ref 5–15)
BUN: 11 mg/dL (ref 6–20)
CHLORIDE: 108 mmol/L (ref 101–111)
CO2: 27 mmol/L (ref 22–32)
CREATININE: 1.04 mg/dL (ref 0.61–1.24)
Calcium: 9 mg/dL (ref 8.9–10.3)
Glucose, Bld: 130 mg/dL — ABNORMAL HIGH (ref 65–99)
POTASSIUM: 3.9 mmol/L (ref 3.5–5.1)
SODIUM: 140 mmol/L (ref 135–145)
Total Bilirubin: 0.7 mg/dL (ref 0.3–1.2)
Total Protein: 6.6 g/dL (ref 6.5–8.1)

## 2016-09-08 LAB — CBC
HCT: 38.7 % — ABNORMAL LOW (ref 40.0–52.0)
Hemoglobin: 13.4 g/dL (ref 13.0–18.0)
MCH: 30 pg (ref 26.0–34.0)
MCHC: 34.5 g/dL (ref 32.0–36.0)
MCV: 86.9 fL (ref 80.0–100.0)
Platelets: 113 10*3/uL — ABNORMAL LOW (ref 150–440)
RBC: 4.46 MIL/uL (ref 4.40–5.90)
RDW: 15.2 % — AB (ref 11.5–14.5)
WBC: 5.5 10*3/uL (ref 3.8–10.6)

## 2016-09-08 LAB — URINALYSIS, COMPLETE (UACMP) WITH MICROSCOPIC
BILIRUBIN URINE: NEGATIVE
Bacteria, UA: NONE SEEN
GLUCOSE, UA: NEGATIVE mg/dL
Hgb urine dipstick: NEGATIVE
KETONES UR: 5 mg/dL — AB
LEUKOCYTES UA: NEGATIVE
Nitrite: NEGATIVE
PH: 5 (ref 5.0–8.0)
PROTEIN: NEGATIVE mg/dL
Specific Gravity, Urine: 1.016 (ref 1.005–1.030)

## 2016-09-08 LAB — TSH: TSH: 2.346 u[IU]/mL (ref 0.350–4.500)

## 2016-09-08 LAB — GLUCOSE, CAPILLARY: GLUCOSE-CAPILLARY: 116 mg/dL — AB (ref 65–99)

## 2016-09-08 LAB — TROPONIN I: Troponin I: <0.03 ng/mL (ref <0.03)

## 2016-09-08 LAB — ETHANOL: Alcohol, Ethyl (B): <5 mg/dL (ref <5)

## 2016-09-08 LAB — AMMONIA: AMMONIA: 18 umol/L (ref 9–35)

## 2016-09-08 MED ORDER — CARBIDOPA-LEVODOPA ER 50-200 MG PO TBCR
1.0000 | EXTENDED_RELEASE_TABLET | Freq: Four times a day (QID) | ORAL | Status: DC
  Administered 2016-09-08 – 2016-09-09 (×4): 1 via ORAL
  Filled 2016-09-08 (×7): qty 1

## 2016-09-08 MED ORDER — LAMOTRIGINE 100 MG PO TABS
100.0000 mg | ORAL_TABLET | Freq: Two times a day (BID) | ORAL | Status: DC
  Administered 2016-09-08 – 2016-09-09 (×2): 100 mg via ORAL
  Filled 2016-09-08 (×2): qty 1

## 2016-09-08 MED ORDER — LEVOTHYROXINE SODIUM 88 MCG PO TABS
88.0000 ug | ORAL_TABLET | Freq: Every day | ORAL | Status: DC
  Administered 2016-09-09: 88 ug via ORAL
  Filled 2016-09-08: qty 1

## 2016-09-08 MED ORDER — CITALOPRAM HYDROBROMIDE 20 MG PO TABS
20.0000 mg | ORAL_TABLET | Freq: Every day | ORAL | Status: DC
  Administered 2016-09-09: 20 mg via ORAL
  Filled 2016-09-08: qty 1

## 2016-09-08 MED ORDER — FINASTERIDE 5 MG PO TABS
5.0000 mg | ORAL_TABLET | Freq: Every day | ORAL | Status: DC
  Administered 2016-09-09: 5 mg via ORAL
  Filled 2016-09-08 (×2): qty 1

## 2016-09-08 MED ORDER — BISACODYL 5 MG PO TBEC
10.0000 mg | DELAYED_RELEASE_TABLET | Freq: Every day | ORAL | Status: DC | PRN
  Filled 2016-09-08: qty 2

## 2016-09-08 MED ORDER — QUETIAPINE FUMARATE 25 MG PO TABS
25.0000 mg | ORAL_TABLET | Freq: Every day | ORAL | Status: DC
Start: 2016-09-08 — End: 2016-09-09
  Administered 2016-09-08: 25 mg via ORAL
  Filled 2016-09-08: qty 1

## 2016-09-08 MED ORDER — SODIUM CHLORIDE 0.9 % IV BOLUS (SEPSIS)
1000.0000 mL | Freq: Once | INTRAVENOUS | Status: AC
  Administered 2016-09-08: 1000 mL via INTRAVENOUS

## 2016-09-08 MED ORDER — ASPIRIN EC 325 MG PO TBEC
325.0000 mg | DELAYED_RELEASE_TABLET | Freq: Every day | ORAL | Status: DC
  Administered 2016-09-09: 325 mg via ORAL
  Filled 2016-09-08: qty 1

## 2016-09-08 MED ORDER — LEVETIRACETAM 500 MG PO TABS
500.0000 mg | ORAL_TABLET | Freq: Two times a day (BID) | ORAL | Status: DC
  Administered 2016-09-08 – 2016-09-09 (×2): 500 mg via ORAL
  Filled 2016-09-08 (×2): qty 1

## 2016-09-08 MED ORDER — CARBIDOPA-LEVODOPA 25-100 MG PO TABS
1.0000 | ORAL_TABLET | Freq: Two times a day (BID) | ORAL | Status: DC
  Administered 2016-09-09: 1 via ORAL
  Filled 2016-09-08 (×3): qty 1

## 2016-09-08 NOTE — ED Notes (Signed)
Dr. Norman at bedside.  

## 2016-09-08 NOTE — BH Assessment (Signed)
Referral for Geriatric placement has been submitted to:   [facility] [phone])   [facility] [phone])   [facility] [phone])   [facility] [phone])   [facility] [phone])   [facility] [phone])   [facility] [phone]

## 2016-09-08 NOTE — ED Notes (Signed)
Mallie Mussel RN talked to pharmacist Shanon Brow about the Pt's order for levadopa and confirmed that is the right order and safe to give to the Pt.

## 2016-09-08 NOTE — ED Notes (Addendum)
Pt changed into behavioral clothing w/o incident and moved to room 22. tt

## 2016-09-08 NOTE — Consult Note (Addendum)
[facility] Consult   Reason for Consult:  Consult for 83 year old man brought to the hospital by family after he made suicidal statements and was attempting to find a firearm at home Referring Physician:  Burlene Arnt Patient Identification: Lawrence Coffee Sr. 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 Chest wall pain [R07.89] 11/29/2014  . Hypertension [I10] 10/11/2014  . Hyperlipidemia [E78.5] 10/11/2014  . Idiopathic Parkinson's disease ([place]) [G20] 05/02/2014  . Leg pain, right [M79.604] 05/02/2014  . Back pain, chronic [M54.9, G89.29] 12/28/2013  . Clinical depression [F32.9] 08/29/2013  . Has a tremor [R25.1] 08/29/2013  . Mechanical and motor problems with internal organs [R68.89] 08/29/2013  . Disordered sleep [G47.9] 08/29/2013  . Amnesia [R41.3] 08/29/2013  . Fatigue [R53.83] 08/29/2013  . Fall [W19.XXXA] 08/29/2013  . Dizziness [R42] 08/29/2013  . Difficulty in walking [R26.2] 08/29/2013  . Adult hypothyroidism [E03.9] 06/16/2013  . Acquired trigger finger [M65.30] 06/16/2013  . Arrhythmia, sinus node [I49.9] 06/16/2013  . HLD (hyperlipidemia) [E78.5] 06/16/2013  . Benign essential HTN [I10] 06/16/2013    Total Time spent with patient: 1 hour  Subjective:   Lawrence Coffee Sr. is a 83 y.o. male patient admitted with "I was just trying to find my pistol and I was frustrated".  HPI:  Patient interviewed chart reviewed. Case discussed with emergency room physicians and TTS. 83 year old man brought into the hospital out of concern for his behavior. Wife reports that the patient was fumbling around the house and trying to find his pistol. Looking in the places where he used to hide it. In the course of that she reports that he made statements about shooting himself. On interview today the  patient admits having said that but says that he had no intention of shooting himself. Instead he says that he was looking for the pistol because he knows that his wife is having male visitors sneaking into their bedroom because he can hear them. He has evidently been having auditory hallucinations with delusions and says he is going to deal with the problem by finding his gun. Patient has poor insight into this. His mood is stated as being okay. Sleep is erratic. Appetite okay. Denies suicidal thoughts. Patient is reporting what I am sure are hallucinations and paranoid delusional thoughts about it and have any insight. Patient has Parkinson's disease of relatively recent diagnosis and is on medications for it with some recent changes.  Social history: Patient is retired lives with his wife out in [place].  Medical history: Parkinson's disease. Patient also has a history of seizure disorder and is on anticonvulsant medicine. Some mention is made from the family that changes in antiseizure medicine may have resulted in some mental status changes.  Substance abuse history: Denies any history current or past of alcohol or drug abuse  Past Psychiatric History: No past psychiatric history. No history of psychiatric hospitalization. No history of suicide attempt. No history of predatory violence. Never apparently been diagnosed with depression or psychosis were treated with any mental health medicine  Risk to Self: Suicidal Ideation: No Suicidal Intent: No Is patient at risk for suicide?: No Suicidal Plan?: No Access to Means: No What has been your use of drugs/alcohol within the last 12 months?: Pt denies drug/alcohol use. Other Self Harm Risks: None Reported Intentional Self Injurious Behavior: None Risk to  Others: Homicidal Ideation: No Thoughts of Harm to Others: No Current Homicidal Intent: No Current Homicidal Plan: No Access to Homicidal Means: No History of harm to others?: No Assessment  of Violence: None Noted Does patient have access to weapons?: No (Pt has firearm but does not know where it is) Criminal Charges Pending?: No Does patient have a court date: No Prior Inpatient Therapy: Prior Inpatient Therapy: No Prior Outpatient Therapy: Prior Outpatient Therapy: No Does patient have an ACCT team?: No Does patient have Intensive In-House Services?  : No Does patient have [facility] services? : No Does patient have P4CC services?: No  Past Medical History:  Past Medical History:  Diagnosis Date  . Arthritis   . Bradycardia   . Chronic kidney disease    enlarged prostrate  . Depression   . Dysrhythmia   . Hypertension   . Hypothyroid   . Hypothyroidism   . Pacemaker   . Parkinson disease ([place])   . Vertigo     Past Surgical History:  Procedure Laterality Date  . APPENDECTOMY    . CARDIAC CATHETERIZATION     09  . CATARACT EXTRACTION    . HERNIA REPAIR  20 years ago   umbiluical   . HERNIA REPAIR Left 07-25-13   inguinal  . INSERT / REPLACE / REMOVE PACEMAKER     dr Ilona Sorrel     09  . JOINT REPLACEMENT     lt knee  . JOINT REPLACEMENT     right hip  . KNEE SURGERY Left   . LUMBAR LAMINECTOMY/DECOMPRESSION MICRODISCECTOMY  08/06/2011   Procedure: LUMBAR LAMINECTOMY/DECOMPRESSION MICRODISCECTOMY 2 LEVELS;  Surgeon: Eustace Moore, MD;  Location: [facility];  Service: Neurosurgery;  Laterality: N/A;  Lumbar two three, lumbar three four decompressive laminectomy  . PARTIAL HIP ARTHROPLASTY Right 2014  . SHOULDER SURGERY Right   . thumbs     trigger thumbs   Family History:  Family History  Problem Relation Age of Onset  . Prostate cancer Brother   . Kidney cancer Neg Hx   . Bladder Cancer Neg Hx    Family Psychiatric  History: Does not know of any Social History:  History  Alcohol Use No    Comment: socially     History  Drug Use No    Social History   Social History  . Marital status: Widowed    Spouse name: N/A  . Number of  children: N/A  . Years of education: N/A   Social History Main Topics  . Smoking status: Former Smoker    Quit date: 08/04/1961  . Smokeless tobacco: Never Used  . Alcohol use No     Comment: socially  . Drug use: No  . Sexual activity: Not Asked   Other Topics Concern  . None   Social History Narrative  . None   Additional Social History:    Allergies:   Allergies  Allergen Reactions  . No Known Allergies   . Adhesive [Tape] Itching and Other (See Comments)    blisters    Labs:  Results for orders placed or performed during the hospital encounter of 09/08/16 (from the past 48 hour(s))  Urinalysis, Complete w Microscopic     Status: Abnormal   Collection Time: 09/08/16 10:46 AM  Result Value Ref Range   Color, Urine YELLOW (A) YELLOW   APPearance CLEAR (A) CLEAR   Specific Gravity, Urine 1.016 1.005 - 1.030   pH 5.0 5.0 - 8.0  Glucose, UA NEGATIVE NEGATIVE mg/dL   Hgb urine dipstick NEGATIVE NEGATIVE   Bilirubin Urine NEGATIVE NEGATIVE   Ketones, ur 5 (A) NEGATIVE mg/dL   Protein, ur NEGATIVE NEGATIVE mg/dL   Nitrite NEGATIVE NEGATIVE   Leukocytes, UA NEGATIVE NEGATIVE   RBC / HPF 0-5 0 - 5 RBC/hpf   WBC, UA 0-5 0 - 5 WBC/hpf   Bacteria, UA NONE SEEN NONE SEEN   Squamous Epithelial / LPF 0-5 (A) NONE SEEN   Mucous PRESENT   Urine Drug Screen, Qualitative ([facility] only)     Status: None   Collection Time: 09/08/16 10:46 AM  Result Value Ref Range   Tricyclic, Ur Screen NONE DETECTED NONE DETECTED   Amphetamines, Ur Screen NONE DETECTED NONE DETECTED   MDMA (Ecstasy)Ur Screen NONE DETECTED NONE DETECTED   Cocaine Metabolite,Ur [place] NONE DETECTED NONE DETECTED   Opiate, Ur Screen NONE DETECTED NONE DETECTED   Phencyclidine (PCP) Ur S NONE DETECTED NONE DETECTED   Cannabinoid 50 Ng, Ur [place] NONE DETECTED NONE DETECTED   Barbiturates, Ur Screen NONE DETECTED NONE DETECTED   Benzodiazepine, Ur Scrn NONE DETECTED NONE DETECTED   Methadone Scn, Ur NONE DETECTED NONE  DETECTED    Comment: (NOTE) 469  Tricyclics, urine               Cutoff 1000 ng/mL 200  Amphetamines, urine             Cutoff 1000 ng/mL 300  MDMA (Ecstasy), urine           Cutoff 500 ng/mL 400  Cocaine Metabolite, urine       Cutoff 300 ng/mL 500  Opiate, urine                   Cutoff 300 ng/mL 600  Phencyclidine (PCP), urine      Cutoff 25 ng/mL 700  Cannabinoid, urine              Cutoff 50 ng/mL 800  Barbiturates, urine             Cutoff 200 ng/mL 900  Benzodiazepine, urine           Cutoff 200 ng/mL 1000 Methadone, urine                Cutoff 300 ng/mL 1100 1200 The urine drug screen provides only a preliminary, unconfirmed 1300 analytical test result and should not be used for non-medical 1400 purposes. Clinical consideration and professional judgment should 1500 be applied to any positive drug screen result due to possible 1600 interfering substances. A more specific alternate chemical method 1700 must be used in order to obtain a confirmed analytical result.  1800 Gas chromato graphy / mass spectrometry (GC/MS) is the preferred 1900 confirmatory method.   CBC     Status: Abnormal   Collection Time: 09/08/16 10:48 AM  Result Value Ref Range   WBC 5.5 3.8 - 10.6 K/uL   RBC 4.46 4.40 - 5.90 MIL/uL   Hemoglobin 13.4 13.0 - 18.0 g/dL   HCT 38.7 (L) 40.0 - 52.0 %   MCV 86.9 80.0 - 100.0 fL   MCH 30.0 26.0 - 34.0 pg   MCHC 34.5 32.0 - 36.0 g/dL   RDW 15.2 (H) 11.5 - 14.5 %   Platelets 113 (L) 150 - 440 K/uL  Comprehensive metabolic panel     Status: Abnormal   Collection Time: 09/08/16 10:48 AM  Result Value Ref Range   Sodium 140 135 - 145  mmol/L   Potassium 3.9 3.5 - 5.1 mmol/L   Chloride 108 101 - 111 mmol/L   CO2 27 22 - 32 mmol/L   Glucose, Bld 130 (H) 65 - 99 mg/dL   BUN 11 6 - 20 mg/dL   Creatinine, Ser 1.04 0.61 - 1.24 mg/dL   Calcium 9.0 8.9 - 10.3 mg/dL   Total Protein 6.6 6.5 - 8.1 g/dL   Albumin 3.7 3.5 - 5.0 g/dL   AST 24 15 - 41 U/L   ALT <5 (L) 17  - 63 U/L   Alkaline Phosphatase 45 38 - 126 U/L   Total Bilirubin 0.7 0.3 - 1.2 mg/dL   GFR calc non Af Amer >60 >60 mL/min   GFR calc Af Amer >60 >60 mL/min    Comment: (NOTE) The eGFR has been calculated using the CKD EPI equation. This calculation has not been validated in all clinical situations. eGFR's persistently <60 mL/min signify possible Chronic Kidney Disease.    Anion gap 5 5 - 15  Ammonia     Status: None   Collection Time: 09/08/16 10:48 AM  Result Value Ref Range   Ammonia 18 9 - 35 umol/L    Comment: HEMOLYSIS AT THIS LEVEL MAY AFFECT RESULT  Troponin I     Status: None   Collection Time: 09/08/16 10:48 AM  Result Value Ref Range   Troponin I <0.03 <0.03 ng/mL  Ethanol     Status: None   Collection Time: 09/08/16 10:48 AM  Result Value Ref Range   Alcohol, Ethyl (B) <5 <5 mg/dL    Comment:        LOWEST DETECTABLE LIMIT FOR SERUM ALCOHOL IS 5 mg/dL FOR MEDICAL PURPOSES ONLY   TSH     Status: None   Collection Time: 09/08/16 10:48 AM  Result Value Ref Range   TSH 2.346 0.350 - 4.500 uIU/mL    Comment: Performed by a 3rd Generation assay with a functional sensitivity of <=0.01 uIU/mL.  Glucose, capillary     Status: Abnormal   Collection Time: 09/08/16 11:23 AM  Result Value Ref Range   Glucose-Capillary 116 (H) 65 - 99 mg/dL    Current Facility-Administered Medications  Medication Dose Route Frequency Provider Last Rate Last Dose  . aspirin EC tablet 325 mg  325 mg Oral Daily Schuyler Amor, MD      . bisacodyl (DULCOLAX) EC tablet 10 mg  10 mg Oral Daily PRN Schuyler Amor, MD      . bupivacaine (PF) (MARCAINE) 0.25 % injection 30 mL  30 mL Other Once Mohammed Kindle, MD      . bupivacaine (PF) (MARCAINE) 0.25 % injection 30 mL  30 mL Other Once Mohammed Kindle, MD      . carbidopa-levodopa (SINEMET CR) 50-200 MG per tablet controlled release 1 tablet  1 tablet Oral QID Schuyler Amor, MD   1 tablet at 09/08/16 1717  . carbidopa-levodopa (SINEMET  IR) 25-100 MG per tablet immediate release 1 tablet  1 tablet Oral BID Schuyler Amor, MD      . ceFAZolin (ANCEF) IVPB 1 g/50 mL premix  1 g Intravenous Once Mohammed Kindle, MD      . ceFAZolin (ANCEF) IVPB 1 g/50 mL premix  1 g Intravenous Once Mohammed Kindle, MD      . citalopram (CELEXA) tablet 20 mg  20 mg Oral Daily Schuyler Amor, MD      . fentaNYL (SUBLIMAZE) injection 100 mcg  100 mcg  Intravenous Once Mohammed Kindle, MD      . finasteride (PROSCAR) tablet 5 mg  5 mg Oral Daily Schuyler Amor, MD      . lactated ringers infusion 1,000 mL  1,000 mL Intravenous Continuous Mohammed Kindle, MD      . lactated ringers infusion 1,000 mL  1,000 mL Intravenous Continuous Mohammed Kindle, MD      . lamoTRIgine (LAMICTAL) tablet 100 mg  100 mg Oral BID Schuyler Amor, MD      . levETIRAcetam (KEPPRA) tablet 500 mg  500 mg Oral BID Schuyler Amor, MD      . Derrill Memo ON 09/09/2016] levothyroxine (SYNTHROID, LEVOTHROID) tablet 88 mcg  88 mcg Oral QAC breakfast Schuyler Amor, MD      . lidocaine (PF) (XYLOCAINE) 1 % injection 10 mL  10 mL Subcutaneous Once Mohammed Kindle, MD      . midazolam (VERSED) 5 MG/5ML injection 5 mg  5 mg Intravenous Once Mohammed Kindle, MD      . orphenadrine (NORFLEX) injection 60 mg  60 mg Intramuscular Once Mohammed Kindle, MD      . orphenadrine (NORFLEX) injection 60 mg  60 mg Intramuscular Once Mohammed Kindle, MD      . triamcinolone acetonide (KENALOG-40) injection 40 mg  40 mg Other Once Mohammed Kindle, MD       Current Outpatient Prescriptions  Medication Sig Dispense Refill  . aspirin EC 325 MG tablet Take 325 mg by mouth daily.    . bisacodyl (DULCOLAX) 5 MG EC tablet Take by mouth.    . carbidopa-levodopa (SINEMET CR) 50-200 MG per tablet Take 1 tablet by mouth 4 (four) times daily.     . carbidopa-levodopa (SINEMET IR) 25-100 MG tablet Take 1 tablet by mouth 2 (two) times daily.  1  . citalopram (CELEXA) 10 MG tablet Take 20 mg by mouth daily.     .  diclofenac sodium (VOLTAREN) 1 % GEL Apply topically.    . finasteride (PROSCAR) 5 MG tablet TAKE 1 TABLET BY MOUTH EVERY DAY 90 tablet 0  . fluticasone (FLONASE) 50 MCG/ACT nasal spray SHAKE LIQUID AND USE 2 SPRAYS IN EACH NOSTRIL DAILY 16 g 2  . lamoTRIgine (LAMICTAL) 25 MG tablet Wk1:1tab night wk2:1tab 2xday wk3:1tab AM 2tabsPM wk4:2tabs 2xday wk5:2tabs AM 3tabs PM wk6:3tabs 2xday wk7:3tabs AM 4tabs PM, wk8:4tab2xday    . levETIRAcetam (KEPPRA) 500 MG tablet Take 500 mg by mouth 2 (two) times daily.  6  . levothyroxine (SYNTHROID, LEVOTHROID) 88 MCG tablet Take 88 mcg by mouth daily before breakfast.    . Melatonin 5 MG TABS Take 1 tablet by mouth at bedtime. Reported on 03/21/2015    . midodrine (PROAMATINE) 2.5 MG tablet Take 2.5 mg by mouth daily.  1  . Multiple Vitamin (MULITIVITAMIN WITH MINERALS) TABS Take 1 tablet by mouth daily.    . naproxen sodium (ANAPROX) 220 MG tablet Take by mouth.    . polyethylene glycol (MIRALAX / GLYCOLAX) packet Take 17 g by mouth at bedtime.    . pravastatin (PRAVACHOL) 20 MG tablet Take 1 tablet (20 mg total) by mouth daily. 90 tablet 0  . Tamsulosin HCl (FLOMAX) 0.4 MG CAPS Take 0.4 mg by mouth daily.    . traMADol (ULTRAM) 50 MG tablet Limit one half to one tab by mouth per day or twice per day if tolerated 40 tablet 0  . cefUROXime (CEFTIN) 250 MG tablet Take 1 tablet (250 mg total) by mouth 2 (two)  times daily with a meal. (Patient not taking: Reported on 01/09/2015) 14 tablet 0  . cefUROXime (CEFTIN) 250 MG tablet Take 1 tablet (250 mg total) by mouth 2 (two) times daily with a meal. (Patient not taking: Reported on 02/12/2015) 14 tablet 0  . cefUROXime (CEFTIN) 250 MG tablet Take 1 tablet (250 mg total) by mouth 2 (two) times daily with a meal. (Patient not taking: Reported on 03/21/2015) 14 tablet 0  . levothyroxine (SYNTHROID, LEVOTHROID) 75 MCG tablet TAKE 1 TABLET(75 MCG) BY MOUTH DAILY BEFORE BREAKFAST (Patient not taking: Reported on 09/08/2016)  30 tablet 0  . lisinopril (PRINIVIL,ZESTRIL) 10 MG tablet Take 1 tablet (10 mg total) by mouth daily. (Patient not taking: Reported on 09/08/2016) 90 tablet 0  . loratadine (CLARITIN) 10 MG tablet Take 1 tablet (10 mg total) by mouth daily. (Patient not taking: Reported on 09/08/2016) 30 tablet 0    Musculoskeletal: Strength & Muscle Tone: decreased Gait & Station: ataxic Patient leans: N/A  Psychiatric Specialty Exam: Physical Exam  Nursing note and vitals reviewed. Constitutional: He appears well-developed and well-nourished.  HENT:  Head: Normocephalic and atraumatic.  Eyes: Conjunctivae are normal. Pupils are equal, round, and reactive to light.  Neck: Normal range of motion.  Cardiovascular: Regular rhythm and normal heart sounds.   Respiratory: Effort normal.  GI: Soft.  Musculoskeletal: Normal range of motion.  Neurological: He is alert. He displays tremor. He exhibits abnormal muscle tone.  Skin: Skin is warm and dry.  Psychiatric: His affect is blunt. His speech is delayed and tangential. He is slowed. Thought content is paranoid and delusional. He expresses impulsivity. He expresses homicidal ideation. He exhibits abnormal recent memory.    Review of Systems  Constitutional: Negative.   HENT: Negative.   Eyes: Negative.   Respiratory: Negative.   Cardiovascular: Negative.   Gastrointestinal: Negative.   Musculoskeletal: Negative.   Skin: Negative.   Neurological: Positive for tremors and focal weakness.  Psychiatric/Behavioral: Positive for hallucinations and memory loss. Negative for depression, substance abuse and suicidal ideas. The patient is nervous/anxious and has insomnia.     Blood pressure (!) 152/81, pulse 74, temperature 98.1 F (36.7 C), temperature source Oral, resp. rate 13, height 5' 6"  (1.676 m), weight 88.5 kg (195 lb), SpO2 97 %.Body mass index is 31.47 kg/m.  General Appearance: Casual  Eye Contact:  Fair  Speech:  Slow  Volume:  Decreased   Mood:  Anxious  Affect:  Blunt  Thought Process:  Disorganized  Orientation:  Negative  Thought Content:  Illogical, Delusions, Hallucinations: Auditory and Paranoid Ideation  Suicidal Thoughts:  Yes.  without intent/plan  Homicidal Thoughts:  Yes.  with intent/plan  Memory:  Immediate;   Fair Recent;   Poor Remote;   Poor  Judgement:  Impaired  Insight:  Lacking  Psychomotor Activity:  Decreased  Concentration:  Concentration: Poor  Recall:  Poor  Fund of Knowledge:  Fair  Language:  Fair  Akathisia:  No  Handed:  Right  AIMS (if indicated):     Assets:  Financial Resources/Insurance Housing Resilience Social Support  ADL's:  Impaired  Cognition:  Impaired,  Mild  Sleep:        Treatment Plan Summary: Daily contact with patient to assess and evaluate symptoms and progress in treatment, Medication management and Plan 83 year old man who presents with what appears to be psychotic symptoms with auditory hallucinations, paranoid delusions, and most distressing way attempts to find a gun to deal with it. No insight. I  should say that he is not agitated or aggressive or hostile here in the emergency room. Clearly has some confusion and dementia. I recommend that we refer him for geriatric psychiatry evaluation. Continue the IVC. Case reviewed with TTS and emergency room physician.  Disposition: Recommend psychiatric Inpatient admission when medically cleared. Supportive therapy provided about ongoing stressors.  Alethia Berthold, MD 09/08/2016 6:31 PM

## 2016-09-08 NOTE — ED Notes (Signed)
Pt eyes open, talking and asking questions, updated on plan of care

## 2016-09-08 NOTE — ED Notes (Signed)
Pt resting in bed, ods at bedside

## 2016-09-08 NOTE — ED Notes (Signed)
ED BHU [place] Is the patient under IVC or is there intent for IVC: Yes.   Is the patient medically cleared: Yes.   Is there vacancy in the ED BHU: Yes.   Is the population mix appropriate for patient: Yes.   Is the patient awaiting placement in inpatient or outpatient setting: Yes.   Has the patient had a psychiatric consult: Yes.   Survey of unit performed for contraband, proper placement and condition of furniture, tampering with fixtures in bathroom, shower, and each patient room: Yes.  ; Findings: Environment is secure.  APPEARANCE/BEHAVIOR calm, cooperative and adequate rapport can be established NEURO ASSESSMENT Orientation: place and person Hallucinations: No.None noted (Hallucinations) Speech: Normal Gait: unsteady RESPIRATORY ASSESSMENT Normal expansion.  Clear to auscultation.  No rales, rhonchi, or wheezing., No chest wall tenderness., No kyphosis or scoliosis. CARDIOVASCULAR ASSESSMENT regular rate and rhythm, S1, S2 normal, no murmur, click, rub or gallop GASTROINTESTINAL ASSESSMENT soft, nontender, BS WNL, no r/g EXTREMITIES normal strength, tone, and muscle mass, no deformities, no erythema, induration, or nodules, no evidence of joint effusion, ROM of all joints is normal, no evidence of joint instability PLAN OF CARE Provide calm/safe environment. Vital signs assessed twice daily. ED BHU Assessment once each 12-hour shift. Collaborate with intake RN daily or as condition indicates. Assure the ED provider has rounded once each shift. Provide and encourage hygiene. Provide redirection as needed. Assess for escalating behavior; address immediately and inform ED provider.  Assess family dynamic and appropriateness for visitation as needed: No.; If necessary, describe findings: family is not present, therefore family dynamic were not assessed.  Educate the patient/family about BHU procedures/visitation: Yes.  ; If necessary, describe findings: Pt states that he  understands the rules.

## 2016-09-08 NOTE — ED Notes (Signed)
PT  IVC 

## 2016-09-08 NOTE — ED Notes (Signed)
Pt made aware of IVC status, pt transferred to CT with [employer] officer

## 2016-09-08 NOTE — ED Notes (Signed)
Patient assigned to appropriate care area. Patient oriented to unit/care area: Informed that, for their safety, care areas are designed for safety and monitored by security cameras at all times; and visiting hours explained to patient. Patient verbalizes understanding, and verbal contract for safety obtained. 

## 2016-09-08 NOTE — ED Notes (Signed)
PT  IVC  SEEN  BY  DR  CLAPACS  PENDING  PLACEMENT 

## 2016-09-08 NOTE — ED Notes (Signed)
BEHAVIORAL HEALTH ROUNDING Patient sleeping: No. Patient alert and oriented: yes Behavior appropriate: Yes.  ; If no, describe:  Nutrition and fluids offered: yes Toileting and hygiene offered: Yes  Sitter present: q15 minute observations and security  monitoring Law enforcement present: Yes  ODS  

## 2016-09-08 NOTE — ED Provider Notes (Signed)
[facility] Emergency Department Provider Note  ____________________________________________  Time seen: Approximately 11:03 AM  I have reviewed the triage vital signs and the nursing notes.   HISTORY  Chief Complaint Altered Mental Status  History is limited due the patient's altered mental status; the majority of the history is obtained from his wife.  HPI Lawrence Valdez Sr. is a 83 y.o. male with a history of Parkinson disease, seizure disorder, [facility] ED, HTN, hypothyroidism brought by his wife for altered mental status and suicidality. His wife notes that he has had visual and auditory hallucinations for several months, as well as occasional times when he tells her "I don't want to live like this. If I ever cannot take care of myself, I want to die."Over the past few nights, the patient's hallucinations have worsened, and he has been roaming around at night. Last night, his wife reports that she found him rummaging around a file cabinet where he used to keep his pistols and she was concerned that he was trying to commit suicide. This morning, the patient's wife reports that he had decreased responsiveness and was more somnolent than usual, keeping his eyes closed. The patient does take a large number of medications, but there have been no changes in his medications. No evidence for seizure although the patient's wife states that sometimes he has decreased responsiveness after seizures. The patient denies any pain.   Past Medical History:  Diagnosis Date  . Arthritis   . Bradycardia   . Chronic kidney disease    enlarged prostrate  . Depression   . Dysrhythmia   . Hypertension   . Hypothyroid   . Hypothyroidism   . Pacemaker   . Parkinson disease ([place])   . Vertigo     Patient Active Problem List   Diagnosis Date Noted  . Chest wall pain 11/29/2014  . Hypertension 10/11/2014  . Hyperlipidemia 10/11/2014  . Idiopathic Parkinson's disease ([place])  05/02/2014  . Leg pain, right 05/02/2014  . Back pain, chronic 12/28/2013  . Clinical depression 08/29/2013  . Has a tremor 08/29/2013  . Mechanical and motor problems with internal organs 08/29/2013  . Disordered sleep 08/29/2013  . Amnesia 08/29/2013  . Fatigue 08/29/2013  . Fall 08/29/2013  . Dizziness 08/29/2013  . Difficulty in walking 08/29/2013  . Adult hypothyroidism 06/16/2013  . Acquired trigger finger 06/16/2013  . Arrhythmia, sinus node 06/16/2013  . HLD (hyperlipidemia) 06/16/2013  . Benign essential HTN 06/16/2013    Past Surgical History:  Procedure Laterality Date  . APPENDECTOMY    . CARDIAC CATHETERIZATION     09  . CATARACT EXTRACTION    . HERNIA REPAIR  20 years ago   umbiluical   . HERNIA REPAIR Left 07-25-13   inguinal  . INSERT / REPLACE / REMOVE PACEMAKER     dr Ilona Sorrel     09  . JOINT REPLACEMENT     lt knee  . JOINT REPLACEMENT     right hip  . KNEE SURGERY Left   . LUMBAR LAMINECTOMY/DECOMPRESSION MICRODISCECTOMY  08/06/2011   Procedure: LUMBAR LAMINECTOMY/DECOMPRESSION MICRODISCECTOMY 2 LEVELS;  Surgeon: Eustace Moore, MD;  Location: [place] [facility];  Service: Neurosurgery;  Laterality: N/A;  Lumbar two three, lumbar three four decompressive laminectomy  . PARTIAL HIP ARTHROPLASTY Right 2014  . SHOULDER SURGERY Right   . thumbs     trigger thumbs    Current Outpatient Rx  . Order #: [id number] Class: Historical Med  .  Order #: [id number] Class: Historical Med  . Order #: [id number] Class: Historical Med  . Order #: [id number] Class: Historical Med  . Order #: [id number] Class: Historical Med  . Order #: [id number] Class: Historical Med  . Order #: [id number] Class: Normal  . Order #: [id number] Class: Normal  . Order #: [id number] Class: Historical Med  . Order #: [id number] Class: Historical Med  . Order #: [id number] Class: Historical Med  . Order #: [id number] Class: Historical Med  . Order #: [id number] Class: Historical Med  . Order #: [id number] Class:  Historical Med  . Order #: [id number] Class: Historical Med  . Order #: [id number] Class: Historical Med  . Order #: [id number] Class: Normal  . Order #: [id number] Class: Historical Med  . Order #: [id number] Class: Print  . Order #: [id number] Class: Normal  . Order #: [id number] Class: Normal  . Order #: [id number] Class: Normal  . Order #: [id number] Class: Normal  . Order #: [id number] Class: Normal  . Order #: [id number] Class: Normal    Allergies No known allergies and Adhesive [tape]  Family History  Problem Relation Age of Onset  . Prostate cancer Brother   . Kidney cancer Neg Hx   . Bladder Cancer Neg Hx     Social History Social History  Substance Use Topics  . Smoking status: Former Smoker    Quit date: 08/04/1961  . Smokeless tobacco: Never Used  . Alcohol use No     Comment: socially    Review of Systems Constitutional: No fever/chills. No trauma. No lightheadedness or syncope. Positive progressively increasing symptoms of Parkinson's. Eyes: No known visual changes. ENT: No sore throat. No congestion or rhinorrhea. Cardiovascular: Denies chest pain. Denies palpitations. Respiratory: Denies shortness of breath.  No cough. Gastrointestinal: No abdominal pain.  No nausea, no vomiting.  No diarrhea.  No constipation. Genitourinary: Negative for dysuria. Musculoskeletal: Negative for back pain. Skin: Negative for rash. Neurological: Negative for headaches. No focal numbness, tingling or weakness. Positive altered mental status. Psychiatric:Possible SI; patient is brought here because his wife found him looking for a gun, but he shakes his neck head no when asked if he is suicidal. As of visual and auditory hallucinations.    ____________________________________________   PHYSICAL EXAM:  VITAL SIGNS: ED Triage Vitals  Enc Vitals Group     BP 09/08/16 1025 (!) 147/78     Pulse Rate 09/08/16 1025 66     Resp 09/08/16 1025 16     Temp 09/08/16 1025 98.1 F (36.7 C)     Temp  Source 09/08/16 1025 Oral     SpO2 09/08/16 1025 98 %     Weight 09/08/16 1026 195 lb (88.5 kg)     Height 09/08/16 1026 5\' 6"  (1.676 m)     Head Circumference --      Peak Flow --      Pain Score --      Pain Loc --      Pain Edu? --      Excl. in [place]? --     Constitutional: The patient has his eyes forcibly closed while laying comfortably in the stretcher. He does open them when asked to, and he occasionally answer some questions but not all of them. GCS is 15. He is able to follow basic commands. Eyes: Conjunctivae are normal.  EOMI. PERRLA. No scleral icterus. Head: Atraumatic. No raccoon eyes or Battle sign. Nose: No congestion/rhinnorhea. Mouth/Throat: Mucous membranes are mildly dry.  Neck: No stridor.  Supple.  No JVD. No meningismus. Cardiovascular: Normal rate, regular rhythm. No murmurs, rubs or gallops.  Respiratory:  Normal respiratory effort.  No accessory muscle use or retractions. Lungs CTAB.  No wheezes, rales or ronchi. Gastrointestinal: Soft, nontender and nondistended.  No guarding or rebound.  No peritoneal signs. Musculoskeletal: No LE edema. No ttp in the calves or palpable cords.  Negative Homan's sign. Neurologic:  the patient has his eyes closed, but is able to open them with verbal command. Knows it is 2018 but thinks it is December. Face is symmetric but does have the flaccid appearance that is typical parkinsonism. EOMI and PERRLA. Patient does speak, his speech is clear. The patient moves all 4 extremities without any difficulty. He does have a rolling pin tremor in the right hand. Skin:  Skin is warm, dry and intact. No rash noted. Psychiatric: Unable to completely assess as the patient does not answer many questions, but he does nod his head no when asked if he is suicidal.  ____________________________________________   LABS (all labs ordered are listed, but only abnormal results are displayed)  Labs Reviewed  CBC - Abnormal; Notable for the following:        Result Value   HCT 38.7 (*)    RDW 15.2 (*)    Platelets 113 (*)    All other components within normal limits  COMPREHENSIVE METABOLIC PANEL - Abnormal; Notable for the following:    Glucose, Bld 130 (*)    ALT <5 (*)    All other components within normal limits  URINALYSIS, COMPLETE (UACMP) WITH MICROSCOPIC - Abnormal; Notable for the following:    Color, Urine YELLOW (*)    APPearance CLEAR (*)    Ketones, ur 5 (*)    Squamous Epithelial / LPF 0-5 (*)    All other components within normal limits  GLUCOSE, CAPILLARY - Abnormal; Notable for the following:    Glucose-Capillary 116 (*)    All other components within normal limits  AMMONIA  TROPONIN I  ETHANOL  TSH  URINE DRUG SCREEN, QUALITATIVE ([facility] ONLY)  CBG MONITORING, ED   ____________________________________________  EKG  ED ECG REPORT I, Eula Listen, the attending physician, personally viewed and interpreted this ECG.   Date: 09/08/2016  EKG Time: 1040  Rate: 63  Rhythm: paced   ____________________________________________  RADIOLOGY  Ct Head Wo Contrast  Result Date: 09/08/2016 CLINICAL DATA:  Altered mental status. EXAM: CT HEAD WITHOUT CONTRAST TECHNIQUE: Contiguous axial images were obtained from the base of the skull through the vertex without intravenous contrast. COMPARISON:  08/01/2016 FINDINGS: Brain: Diffuse cerebral atrophy with extensive low density throughout the white matter. These findings are unchanged. Old insult in the left basal ganglia. No evidence for acute hemorrhage, mass lesion, midline shift, hydrocephalus or large infarct. Vascular: No hyperdense vessel or unexpected calcification. Skull: Normal. Negative for fracture or focal lesion. Sinuses/Orbits: Minimal fluid in the left mastoid air cells. Other: None. IMPRESSION: No acute intracranial abnormality. Stable atrophy and chronic white matter changes. Findings likely represent chronic small vessel ischemic changes.  Electronically Signed   By: Markus Daft M.D.   On: 09/08/2016 11:43    ____________________________________________   PROCEDURES  Procedure(s) performed: None  Procedures  Critical Care performed: No ____________________________________________   INITIAL IMPRESSION / ASSESSMENT AND PLAN / ED COURSE  Pertinent labs & imaging results that were available during my care of the patient were reviewed by me and considered in my medical decision making (see chart for details).  83 y.o. male with a history of parkinsonism, seizure disorder, arrhythmia status post pacemaker presenting for altered mental  status, possible suicidality. For his suicidality risk, we'll get involuntary commitment paperwork, but will not initiate psychiatric evaluation here as the patient will need medical admission for his altered mental status. For his altered mental status, I'll get a CT of the head to evaluate for any acute intracranial process. We'll also rule out infection with a UA. The patient has not had any cough and lungs are clear with normal oxygenation, so pneumonia is very unlikely. We'll also get basic labs. Consider polypharmacy, or overdose. I anticipate admission for this patient.  ----------------------------------------- 12:24 PM on 09/08/2016 -----------------------------------------  At this time, the patient has no acute medical findings. He is awake, states he feels back to his normal baseline, and is acting appropriately. He is answering all questions appropriately. It is unclear why he was somnolent upon arrival, but at this time there is no evidence of an acute medical emergency condition. His ammonia is normal. His I to normal his blood sugar is normal, his blood counts are reassuring, his TSH, troponin and alcohol levels are also reassuring, he does not have a UTI, and his CT scan does not show any acute pathology. I am still concerned about his suicidality, and we will continue to monitor him  for psychiatric evaluation. At this time, the patient is medically cleared for psychiatric disposition.  ____________________________________________  FINAL CLINICAL IMPRESSION(S) / ED DIAGNOSES  Final diagnoses:  Altered mental status, unspecified altered mental status type  Depression with suicidal ideation         NEW MEDICATIONS STARTED DURING THIS VISIT:  New Prescriptions   No medications on file      Eula Listen, MD 09/08/16 1225

## 2016-09-08 NOTE — ED Triage Notes (Addendum)
Pt is here with his wife. She states the pt is normally a/ox3. States for the past 2 days he has been up at night with hallucinations, pt is sitting in triage with eyes closed.. Wife also states he has been expressing SI since dx with parkinsons, states recent med change for parkinsons..pt is not speaking at present and unable to get pt to express suicide or answer questions.

## 2016-09-08 NOTE — BH Assessment (Signed)
Tele Assessment Note   Lawrence HANDLER Sr. is an 83 y.o. male initially presenting to ED with wife with c/o altered mental status and AVH.Marland Kitchen Pt presents as pleasant and cooperative. Pt has h/o parkinson's. Per pt, wife initially contacted EMS  Due to believing pt wanted to commit suicide. Pt states he was looking in  A drawer for his firearm with the intention of "getting rid of it". When pt's wife asked why he needed it, pt stated "to shoot myself I guess". Pt states he said so jokingly. Pt denies SI/intent/plan. Pt denies h/o suicide attempts. Pt states the firearm was not in the drawer and he does not know where it is. Pt denies HI/thoughts of harming others. Pt reports difficulty with memory. Pt reports AVH. Pt reports looking at the curtains in his home and seeing them blow in the wind when they were actually still. Pt also reports hearing a man's voice in his wife's room. Pt identifies his wife as a strong support. 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    Diagnosis: MDD  Past Medical History:  Past Medical History:  Diagnosis Date  . Arthritis   . Bradycardia   . Chronic kidney disease    enlarged prostrate  . Depression   . Dysrhythmia   . Hypertension   . Hypothyroid   . Hypothyroidism   . Pacemaker   . Parkinson disease ([place])   . Vertigo     Past Surgical History:  Procedure Laterality Date  . APPENDECTOMY    . CARDIAC CATHETERIZATION     09  . CATARACT EXTRACTION    . HERNIA REPAIR  20 years ago   umbiluical   . HERNIA REPAIR Left 07-25-13   inguinal  . INSERT / REPLACE / REMOVE PACEMAKER     dr Ilona Sorrel     09  . JOINT REPLACEMENT     lt knee  . JOINT REPLACEMENT     right hip  . KNEE SURGERY Left   . LUMBAR LAMINECTOMY/DECOMPRESSION MICRODISCECTOMY  08/06/2011   Procedure: LUMBAR LAMINECTOMY/DECOMPRESSION MICRODISCECTOMY 2 LEVELS;  Surgeon: Eustace Moore, MD;  Location: [place] [facility];  Service: Neurosurgery;  Laterality: N/A;  Lumbar two three,  lumbar three four decompressive laminectomy  . PARTIAL HIP ARTHROPLASTY Right 2014  . SHOULDER SURGERY Right   . thumbs     trigger thumbs    Family History:  Family History  Problem Relation Age of Onset  . Prostate cancer Brother   . Kidney cancer Neg Hx   . Bladder Cancer Neg Hx     Social History:  reports that he quit smoking about 55 years ago. He has never used smokeless tobacco. He reports that he does not drink alcohol or use drugs.  Additional Social History:  Alcohol / Drug Use Pain Medications: Pt denies abuse. Prescriptions: Pt denies abuse. Over the Counter: Pt denies abuse. History of alcohol / drug use?: No history of alcohol / drug abuse  CIWA: CIWA-Ar BP: (!) 152/81 Pulse Rate: 74 COWS:    PATIENT STRENGTHS: (choose at least two) Average or above average intelligence Communication skills General fund of knowledge Supportive family/friends  Allergies:  Allergies  Allergen Reactions  . No Known Allergies   . Adhesive [Tape] Itching and Other (See Comments)    blisters    Home Medications:  (Not in a hospital admission)  OB/GYN Status:  No LMP for male patient.  General Assessment Data [facility] of Assessment: [facility]  ED TTS Assessment: In system Is this a Tele or Face-to-Face Assessment?: Face-to-Face Is this an Initial Assessment or a Re-assessment for this encounter?: Initial Assessment Marital status: Married [place] name: N/A Is patient pregnant?: No Pregnancy Status: No Living Arrangements: Spouse/significant other Can pt return to current living arrangement?: Yes Admission Status: Involuntary Is patient capable of signing voluntary admission?: No (IVC) Referral Source: Self/Family/Friend Insurance type: Equities trader     Crisis Care Plan Living Arrangements: Spouse/significant other Name of Psychiatrist: None Name of Therapist: None  Education Status Is patient currently in school?: No Highest grade of school patient has  completed: GED  Risk to self with the past 6 months Suicidal Ideation: No Has patient been a risk to self within the past 6 months prior to admission? : No Suicidal Intent: No Has patient had any suicidal intent within the past 6 months prior to admission? : No Is patient at risk for suicide?: No Suicidal Plan?: No Has patient had any suicidal plan within the past 6 months prior to admission? : No Access to Means: No What has been your use of drugs/alcohol within the last 12 months?: Pt denies drug/alcohol use. Previous Attempts/Gestures: No Other Self Harm Risks: None Reported Intentional Self Injurious Behavior: None Family Suicide History: No Recent stressful life event(s):  (Pt denies) Persecutory voices/beliefs?: No Depression: No Depression Symptoms:  (sad) Substance abuse history and/or treatment for substance abuse?: No Suicide prevention information given to non-admitted patients: Not applicable  Risk to Others within the past 6 months Homicidal Ideation: No Does patient have any lifetime risk of violence toward others beyond the six months prior to admission? : No Thoughts of Harm to Others: No Current Homicidal Intent: No Current Homicidal Plan: No Access to Homicidal Means: No History of harm to others?: No Assessment of Violence: None Noted Does patient have access to weapons?: No (Pt has firearm but does not know where it is) Criminal Charges Pending?: No Does patient have a court date: No Is patient on probation?: No  Psychosis Hallucinations: Auditory, Visual (heard man talking in home, curtains moving in wind) Delusions: None noted  Mental Status Report Appearance/Hygiene: In scrubs Eye Contact: Good Motor Activity: Tremors Speech: Logical/coherent Level of Consciousness: Alert Mood: Pleasant Affect: Other (Comment) (Mood Congruent) Anxiety Level: Minimal Thought Processes: Coherent, Relevant Judgement: Unimpaired Orientation: Person, Place, Time,  Situation Obsessive Compulsive Thoughts/Behaviors: None  Cognitive Functioning Concentration: Normal Memory: Remote Impaired, Recent Intact IQ: Average Insight: Good Appetite: Fair Weight Loss: 75 (1.5) Weight Gain: 0 Sleep: Decreased Total Hours of Sleep: 6  ADLScreening [facility] Assessment Services) Patient's cognitive ability adequate to safely complete daily activities?: Yes Patient able to express need for assistance with ADLs?: Yes Independently performs ADLs?: No  Prior Inpatient Therapy Prior Inpatient Therapy: No  Prior Outpatient Therapy Prior Outpatient Therapy: No Does patient have an ACCT team?: No Does patient have Intensive In-House Services?  : No Does patient have [facility] services? : No Does patient have P4CC services?: No  ADL Screening (condition at time of admission) Patient's cognitive ability adequate to safely complete daily activities?: Yes Is the patient deaf or have difficulty hearing?: No Does the patient have difficulty seeing, even when wearing glasses/contacts?: No Does the patient have difficulty concentrating, remembering, or making decisions?: Yes Patient able to express need for assistance with ADLs?: Yes Does the patient have difficulty dressing or bathing?: Yes Independently performs ADLs?: No Communication: Independent Dressing (OT): Needs assistance Is this a change from baseline?: Pre-admission baseline Grooming:  Needs assistance Is this a change from baseline?: Pre-admission baseline Feeding: Needs assistance Is this a change from baseline?: Pre-admission baseline Bathing: Needs assistance Is this a change from baseline?: Pre-admission baseline Toileting: Needs assistance Is this a change from baseline?: Pre-admission baseline In/Out Bed: Independent with device (comment) Gilford Rile) Walks in Home: Independent with device (comment) Gilford Rile) Does the patient have difficulty walking or climbing stairs?: Yes Weakness of Legs:  None Weakness of Arms/Hands: None  Home Assistive Devices/Equipment Home Assistive Devices/Equipment: Environmental consultant (specify type)  Therapy Consults (therapy consults require a physician order) PT Evaluation Needed: No OT Evalulation Needed: No SLP Evaluation Needed: No Abuse/Neglect Assessment (Assessment to be complete while patient is alone) Physical Abuse: Denies Verbal Abuse: Denies Sexual Abuse: Denies Exploitation of patient/patient's resources: Denies Self-Neglect: Denies Values / Beliefs Cultural Requests During Hospitalization: None Spiritual Requests During Hospitalization: None Consults Spiritual Care Consult Needed: No Advance Directives (For Healthcare) Does Patient Have a Medical Advance Directive?: No Would patient like information on creating a medical advance directive?: No - Patient declined    Additional Information 1:1 In Past 12 Months?: No CIRT Risk: No Elopement Risk: No Does patient have medical clearance?: Yes     Disposition:  Disposition Initial Assessment Completed for this Encounter: Yes Disposition of Patient: Other dispositions Other disposition(s): Other (Comment) (Pending Psych MD Consult)  Gailyn Crook J [place] 09/08/2016 3:36 PM

## 2016-09-08 NOTE — ED Notes (Signed)
Wife at bedside, per wife pt has been coming into her room in the middle of the night saying he is hearing voices of people hiding in her room, per wife pt was looking through a file cabinet last night where he thought there was a gun, pt has hx of parkinsons, wife states she controls his medication, pt lying in bed with eyes closed during assessment, pt squeezes hands upon command, will not answer questions

## 2016-09-08 NOTE — ED Notes (Signed)
Pt given supper tray but ask this tech to take it away. Supper tray was placed on sink. Pt given warm blanket, brightest light cut out and door closed by pt request.

## 2016-09-08 NOTE — ED Notes (Signed)
Pt returned from [place], officer at bedside

## 2016-09-08 NOTE — ED Notes (Signed)
Report given to Amy RN, pt to move to quad per Dr. Mariea Clonts, pt dressed out by [facility]

## 2016-09-09 DIAGNOSIS — F22 Delusional disorders: Secondary | ICD-10-CM | POA: Diagnosis not present

## 2016-09-09 DIAGNOSIS — G40909 Epilepsy, unspecified, not intractable, without status epilepticus: Secondary | ICD-10-CM | POA: Diagnosis not present

## 2016-09-09 DIAGNOSIS — R45851 Suicidal ideations: Secondary | ICD-10-CM | POA: Diagnosis not present

## 2016-09-09 DIAGNOSIS — G20 Parkinson's disease: Secondary | ICD-10-CM | POA: Diagnosis not present

## 2016-09-09 DIAGNOSIS — T43595A Adverse effect of other antipsychotics and neuroleptics, initial encounter: Secondary | ICD-10-CM | POA: Diagnosis not present

## 2016-09-09 DIAGNOSIS — I10 Essential (primary) hypertension: Secondary | ICD-10-CM | POA: Diagnosis not present

## 2016-09-09 DIAGNOSIS — Z87891 Personal history of nicotine dependence: Secondary | ICD-10-CM | POA: Diagnosis not present

## 2016-09-09 DIAGNOSIS — G8929 Other chronic pain: Secondary | ICD-10-CM | POA: Diagnosis not present

## 2016-09-09 DIAGNOSIS — Y92009 Unspecified place in unspecified non-institutional (private) residence as the place of occurrence of the external cause: Secondary | ICD-10-CM | POA: Diagnosis not present

## 2016-09-09 DIAGNOSIS — Z96652 Presence of left artificial knee joint: Secondary | ICD-10-CM | POA: Diagnosis not present

## 2016-09-09 DIAGNOSIS — R45850 Homicidal ideations: Secondary | ICD-10-CM | POA: Diagnosis not present

## 2016-09-09 DIAGNOSIS — F329 Major depressive disorder, single episode, unspecified: Secondary | ICD-10-CM | POA: Diagnosis not present

## 2016-09-09 DIAGNOSIS — E785 Hyperlipidemia, unspecified: Secondary | ICD-10-CM | POA: Diagnosis not present

## 2016-09-09 DIAGNOSIS — Z7982 Long term (current) use of aspirin: Secondary | ICD-10-CM | POA: Diagnosis not present

## 2016-09-09 DIAGNOSIS — F09 Unspecified mental disorder due to known physiological condition: Secondary | ICD-10-CM | POA: Diagnosis not present

## 2016-09-09 NOTE — BH Assessment (Signed)
Pt placement referral currently being reviewed by Rosana Hoes (Amy). Amy inquired about administered fentanyl. Clinician referred representative to pt RN @ [phone].

## 2016-09-09 NOTE — ED Notes (Signed)
Pt sleeping. Breakfast tray placed at bedside sink.

## 2016-09-09 NOTE — ED Notes (Signed)
This tech checked [place] locker room for pt belongings, none found. RN notified and will check with wife to see if belongings were taken home from room 3 where pt was dressed out at by tech, Konrad Dolores.

## 2016-09-09 NOTE — BH Assessment (Signed)
Patient has been accepted to [facility].  Patient assigned to room 21 Accepting physician is Dr. Lucilla Edin.  Call report to [phone].  Representative was [place].  ER Staff is aware of it Holley Raring, ER Sect.; Dr. Alfred Levins, ER MD & Amber Patient's Nurse)

## 2016-09-09 NOTE — BH Assessment (Signed)
Clinician contacted pt's wife [name] [phone]) to inform her of pt's acceptance to [facility]. Pt's wife is adamant that she will not allow pt to be placed that far away from this area. Wife states she will be unable to travel the distance to visit pt. Clinician validated wife's hesitations while explaining the inpatient referral process. Clinician also explained IVC process and that pt would not be able to be "signed out" of ED as wife stated she would do.   Clinician contacted Dr. Weber Cooks to inform him of wife's concerns. Dr.Clapacs continues to feel inpatient treatment is needed and instructed clinicians to continue with pt transfer to [facility].  Pt's spouse informed. Spouse provided with supportive counseling and Rosana Hoes contact information.

## 2016-09-10 DIAGNOSIS — F23 Brief psychotic disorder: Secondary | ICD-10-CM | POA: Diagnosis not present

## 2016-09-11 DIAGNOSIS — F23 Brief psychotic disorder: Secondary | ICD-10-CM | POA: Diagnosis not present

## 2016-09-12 DIAGNOSIS — F23 Brief psychotic disorder: Secondary | ICD-10-CM | POA: Diagnosis not present

## 2016-09-13 DIAGNOSIS — F23 Brief psychotic disorder: Secondary | ICD-10-CM | POA: Diagnosis not present

## 2016-09-14 DIAGNOSIS — F23 Brief psychotic disorder: Secondary | ICD-10-CM | POA: Diagnosis not present

## 2016-09-15 DIAGNOSIS — F23 Brief psychotic disorder: Secondary | ICD-10-CM | POA: Diagnosis not present

## 2016-09-16 DIAGNOSIS — F23 Brief psychotic disorder: Secondary | ICD-10-CM | POA: Diagnosis not present

## 2016-09-28 DIAGNOSIS — G20 Parkinson's disease: Secondary | ICD-10-CM | POA: Diagnosis not present

## 2016-09-28 DIAGNOSIS — R251 Tremor, unspecified: Secondary | ICD-10-CM | POA: Diagnosis not present

## 2016-09-28 DIAGNOSIS — R262 Difficulty in walking, not elsewhere classified: Secondary | ICD-10-CM | POA: Diagnosis not present

## 2016-09-28 DIAGNOSIS — R42 Dizziness and giddiness: Secondary | ICD-10-CM | POA: Diagnosis not present

## 2016-09-28 DIAGNOSIS — Z9181 History of falling: Secondary | ICD-10-CM | POA: Diagnosis not present

## 2016-09-28 DIAGNOSIS — R569 Unspecified convulsions: Secondary | ICD-10-CM | POA: Diagnosis not present

## 2016-09-29 DIAGNOSIS — Z9181 History of falling: Secondary | ICD-10-CM | POA: Insufficient documentation

## 2016-10-02 DIAGNOSIS — Z87891 Personal history of nicotine dependence: Secondary | ICD-10-CM | POA: Diagnosis not present

## 2016-10-02 DIAGNOSIS — M6281 Muscle weakness (generalized): Secondary | ICD-10-CM | POA: Diagnosis not present

## 2016-10-02 DIAGNOSIS — G40909 Epilepsy, unspecified, not intractable, without status epilepticus: Secondary | ICD-10-CM | POA: Diagnosis not present

## 2016-10-02 DIAGNOSIS — G20 Parkinson's disease: Secondary | ICD-10-CM | POA: Diagnosis not present

## 2016-10-02 DIAGNOSIS — R262 Difficulty in walking, not elsewhere classified: Secondary | ICD-10-CM | POA: Diagnosis not present

## 2016-10-02 DIAGNOSIS — R413 Other amnesia: Secondary | ICD-10-CM | POA: Diagnosis not present

## 2016-10-02 DIAGNOSIS — F329 Major depressive disorder, single episode, unspecified: Secondary | ICD-10-CM | POA: Diagnosis not present

## 2016-10-20 DIAGNOSIS — E039 Hypothyroidism, unspecified: Secondary | ICD-10-CM | POA: Diagnosis not present

## 2016-10-20 DIAGNOSIS — F3341 Major depressive disorder, recurrent, in partial remission: Secondary | ICD-10-CM | POA: Diagnosis not present

## 2016-10-20 DIAGNOSIS — G40909 Epilepsy, unspecified, not intractable, without status epilepticus: Secondary | ICD-10-CM | POA: Diagnosis not present

## 2016-10-20 DIAGNOSIS — R4182 Altered mental status, unspecified: Secondary | ICD-10-CM | POA: Diagnosis not present

## 2016-10-20 DIAGNOSIS — E7800 Pure hypercholesterolemia, unspecified: Secondary | ICD-10-CM | POA: Diagnosis not present

## 2016-10-21 DIAGNOSIS — Z87891 Personal history of nicotine dependence: Secondary | ICD-10-CM | POA: Diagnosis not present

## 2016-10-21 DIAGNOSIS — R262 Difficulty in walking, not elsewhere classified: Secondary | ICD-10-CM | POA: Diagnosis not present

## 2016-10-21 DIAGNOSIS — G20 Parkinson's disease: Secondary | ICD-10-CM | POA: Diagnosis not present

## 2016-10-21 DIAGNOSIS — M6281 Muscle weakness (generalized): Secondary | ICD-10-CM | POA: Diagnosis not present

## 2016-10-21 DIAGNOSIS — R413 Other amnesia: Secondary | ICD-10-CM | POA: Diagnosis not present

## 2016-10-21 DIAGNOSIS — F329 Major depressive disorder, single episode, unspecified: Secondary | ICD-10-CM | POA: Diagnosis not present

## 2016-10-21 DIAGNOSIS — G40909 Epilepsy, unspecified, not intractable, without status epilepticus: Secondary | ICD-10-CM | POA: Diagnosis not present

## 2016-11-03 DIAGNOSIS — I495 Sick sinus syndrome: Secondary | ICD-10-CM | POA: Diagnosis not present

## 2016-11-11 DIAGNOSIS — R42 Dizziness and giddiness: Secondary | ICD-10-CM | POA: Diagnosis not present

## 2016-11-11 DIAGNOSIS — R569 Unspecified convulsions: Secondary | ICD-10-CM | POA: Diagnosis not present

## 2016-11-11 DIAGNOSIS — Z9181 History of falling: Secondary | ICD-10-CM | POA: Diagnosis not present

## 2016-11-11 DIAGNOSIS — R413 Other amnesia: Secondary | ICD-10-CM | POA: Diagnosis not present

## 2016-11-11 DIAGNOSIS — G479 Sleep disorder, unspecified: Secondary | ICD-10-CM | POA: Diagnosis not present

## 2016-11-11 DIAGNOSIS — G20 Parkinson's disease: Secondary | ICD-10-CM | POA: Diagnosis not present

## 2016-11-11 DIAGNOSIS — G8929 Other chronic pain: Secondary | ICD-10-CM | POA: Diagnosis not present

## 2016-11-11 DIAGNOSIS — R262 Difficulty in walking, not elsewhere classified: Secondary | ICD-10-CM | POA: Diagnosis not present

## 2016-11-11 DIAGNOSIS — F4321 Adjustment disorder with depressed mood: Secondary | ICD-10-CM | POA: Diagnosis not present

## 2016-11-11 DIAGNOSIS — R251 Tremor, unspecified: Secondary | ICD-10-CM | POA: Diagnosis not present

## 2016-11-11 DIAGNOSIS — M545 Low back pain: Secondary | ICD-10-CM | POA: Diagnosis not present

## 2017-01-01 DIAGNOSIS — R42 Dizziness and giddiness: Secondary | ICD-10-CM | POA: Diagnosis not present

## 2017-01-01 DIAGNOSIS — I495 Sick sinus syndrome: Secondary | ICD-10-CM | POA: Diagnosis not present

## 2017-01-01 DIAGNOSIS — Z8679 Personal history of other diseases of the circulatory system: Secondary | ICD-10-CM | POA: Diagnosis not present

## 2017-01-01 DIAGNOSIS — I10 Essential (primary) hypertension: Secondary | ICD-10-CM | POA: Diagnosis not present

## 2017-01-01 DIAGNOSIS — E7800 Pure hypercholesterolemia, unspecified: Secondary | ICD-10-CM | POA: Diagnosis not present

## 2017-01-13 DIAGNOSIS — E7800 Pure hypercholesterolemia, unspecified: Secondary | ICD-10-CM | POA: Diagnosis not present

## 2017-01-13 DIAGNOSIS — E039 Hypothyroidism, unspecified: Secondary | ICD-10-CM | POA: Diagnosis not present

## 2017-01-20 DIAGNOSIS — E7800 Pure hypercholesterolemia, unspecified: Secondary | ICD-10-CM | POA: Diagnosis not present

## 2017-01-20 DIAGNOSIS — E039 Hypothyroidism, unspecified: Secondary | ICD-10-CM | POA: Diagnosis not present

## 2017-01-20 DIAGNOSIS — I959 Hypotension, unspecified: Secondary | ICD-10-CM | POA: Diagnosis not present

## 2017-01-20 DIAGNOSIS — E876 Hypokalemia: Secondary | ICD-10-CM | POA: Diagnosis not present

## 2017-01-20 DIAGNOSIS — Z23 Encounter for immunization: Secondary | ICD-10-CM | POA: Diagnosis not present

## 2017-01-20 DIAGNOSIS — F3341 Major depressive disorder, recurrent, in partial remission: Secondary | ICD-10-CM | POA: Diagnosis not present

## 2017-02-02 DIAGNOSIS — F5105 Insomnia due to other mental disorder: Secondary | ICD-10-CM | POA: Diagnosis not present

## 2017-02-02 DIAGNOSIS — F331 Major depressive disorder, recurrent, moderate: Secondary | ICD-10-CM | POA: Diagnosis not present

## 2017-02-02 DIAGNOSIS — F060 Psychotic disorder with hallucinations due to known physiological condition: Secondary | ICD-10-CM | POA: Diagnosis not present

## 2017-02-09 ENCOUNTER — Ambulatory Visit: Payer: PPO | Admitting: Podiatry

## 2017-02-17 ENCOUNTER — Other Ambulatory Visit: Payer: Self-pay | Admitting: *Deleted

## 2017-02-17 DIAGNOSIS — R569 Unspecified convulsions: Secondary | ICD-10-CM | POA: Diagnosis not present

## 2017-02-17 DIAGNOSIS — G20 Parkinson's disease: Secondary | ICD-10-CM | POA: Diagnosis not present

## 2017-02-17 DIAGNOSIS — L819 Disorder of pigmentation, unspecified: Secondary | ICD-10-CM | POA: Diagnosis not present

## 2017-02-17 NOTE — Patient Outreach (Signed)
[facility] Care Management  02/17/2017  Lawrence STAN Sr. [birth year] [id number]  MD office referral; Cordelia Poche unable to care for patient who is unable to walk. Patient has dx Parkinson's disease. Spouse is primary contact.   Telephone call attempt x 1; left HIPPA compliant voice mail requesting return call.   Plan: Will follow up.  Sherrin Daisy, RN BSN [place] Management Coordinator [facility] Care Management  [phone]

## 2017-02-18 ENCOUNTER — Ambulatory Visit: Payer: Self-pay | Admitting: *Deleted

## 2017-02-18 ENCOUNTER — Emergency Department
Admission: EM | Admit: 2017-02-18 | Discharge: 2017-02-18 | Disposition: A | Discharge status: Home / Self Care | Payer: PPO | Attending: Emergency Medicine | Admitting: Emergency Medicine

## 2017-02-18 ENCOUNTER — Encounter: Payer: Self-pay | Admitting: Emergency Medicine

## 2017-02-18 DIAGNOSIS — I129 Hypertensive chronic kidney disease with stage 1 through stage 4 chronic kidney disease, or unspecified chronic kidney disease: Secondary | ICD-10-CM | POA: Diagnosis not present

## 2017-02-18 DIAGNOSIS — Z79899 Other long term (current) drug therapy: Secondary | ICD-10-CM | POA: Diagnosis not present

## 2017-02-18 DIAGNOSIS — L089 Local infection of the skin and subcutaneous tissue, unspecified: Secondary | ICD-10-CM | POA: Diagnosis not present

## 2017-02-18 DIAGNOSIS — A499 Bacterial infection, unspecified: Secondary | ICD-10-CM | POA: Diagnosis not present

## 2017-02-18 DIAGNOSIS — G20 Parkinson's disease: Secondary | ICD-10-CM | POA: Diagnosis not present

## 2017-02-18 DIAGNOSIS — Z87891 Personal history of nicotine dependence: Secondary | ICD-10-CM | POA: Insufficient documentation

## 2017-02-18 DIAGNOSIS — B9689 Other specified bacterial agents as the cause of diseases classified elsewhere: Secondary | ICD-10-CM | POA: Diagnosis not present

## 2017-02-18 DIAGNOSIS — H9201 Otalgia, right ear: Secondary | ICD-10-CM | POA: Diagnosis present

## 2017-02-18 DIAGNOSIS — Z950 Presence of cardiac pacemaker: Secondary | ICD-10-CM | POA: Diagnosis not present

## 2017-02-18 DIAGNOSIS — E039 Hypothyroidism, unspecified: Secondary | ICD-10-CM | POA: Diagnosis not present

## 2017-02-18 DIAGNOSIS — N189 Chronic kidney disease, unspecified: Secondary | ICD-10-CM | POA: Diagnosis not present

## 2017-02-18 DIAGNOSIS — Z7982 Long term (current) use of aspirin: Secondary | ICD-10-CM | POA: Insufficient documentation

## 2017-02-18 MED ORDER — CEPHALEXIN 500 MG PO CAPS: 500.0000 mg | ORAL_CAPSULE | Freq: Four times a day (QID) | ORAL | 0 refills | Status: AC

## 2017-02-18 MED ORDER — MUPIROCIN 2 % EX OINT: TOPICAL_OINTMENT | CUTANEOUS | 0 refills | Status: DC

## 2017-02-18 NOTE — ED Triage Notes (Signed)
Patient presents to ED via POV from home with c/o right ear pain. 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 When asked who diagnosed it she reports "no one but if you look at it you can tell". Patient reports pain only when its touched. Wife reports noticing it 3 days ago. Denies unexplained weight loss or loss of hearing to affected ear.

## 2017-02-18 NOTE — Discharge Instructions (Signed)
Apply ointment and take medication as directed. Avoid scratching /poking at lesion.

## 2017-02-18 NOTE — ED Provider Notes (Signed)
[facility] Emergency Department Provider Note   ____________________________________________   First MD Initiated Contact with Patient 02/18/17 1153     (approximate)  I have reviewed Lawrence triage vital signs and Lawrence nursing notes.   HISTORY  Chief Complaint Otalgia    Valdez AHAAN ZOBRIST Sr. is Lawrence 83 y.o. male patient complaining of right ear pain. Wife states noticed Lawrence pimple which start growing 3 days ago. Patient state"Bump"is red and swollen. Erythematous. Patient denies fever, hearing loss, or drainage from Lawrence lesion. Patient states Lawrence patient has Parkinson with tremors and repetitive scratching of his skin in and around Lawrence ear. Past Medical History:  Diagnosis Date  . Arthritis   . Bradycardia   . Chronic kidney disease    enlarged prostrate  . Depression   . Dysrhythmia   . Hypertension   . Hypothyroid   . Hypothyroidism   . Pacemaker   . Parkinson disease ([place])   . Vertigo     Patient Active Problem List   Diagnosis Date Noted  . 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 Chest wall pain 11/29/2014  . Hypertension 10/11/2014  . Hyperlipidemia 10/11/2014  . Idiopathic Parkinson's disease ([place]) 05/02/2014  . Leg pain, right 05/02/2014  . Back pain, chronic 12/28/2013  . Clinical depression 08/29/2013  . Has Lawrence tremor 08/29/2013  . Mechanical and motor problems with internal organs 08/29/2013  . Disordered sleep 08/29/2013  . Amnesia 08/29/2013  . Fatigue 08/29/2013  . Fall 08/29/2013  . Dizziness 08/29/2013  . Difficulty in walking 08/29/2013  . Adult hypothyroidism 06/16/2013  . Acquired trigger finger 06/16/2013  . Arrhythmia, sinus node 06/16/2013  . HLD (hyperlipidemia) 06/16/2013  . Benign essential HTN 06/16/2013    Past Surgical History:  Procedure Laterality Date  . APPENDECTOMY    . CARDIAC CATHETERIZATION     09  . CATARACT EXTRACTION    . HERNIA REPAIR  20 years ago   umbiluical   . HERNIA  REPAIR Left 07-25-13   inguinal  . INSERT / REPLACE / REMOVE PACEMAKER     dr Ilona Sorrel     09  . JOINT REPLACEMENT     lt knee  . JOINT REPLACEMENT     right hip  . KNEE SURGERY Left   . LUMBAR LAMINECTOMY/DECOMPRESSION MICRODISCECTOMY  08/06/2011   Procedure: LUMBAR LAMINECTOMY/DECOMPRESSION MICRODISCECTOMY 2 LEVELS;  Surgeon: Eustace Moore, MD;  Location: [place] [facility];  Service: Neurosurgery;  Laterality: N/Lawrence;  Lumbar two three, lumbar three four decompressive laminectomy  . PARTIAL HIP ARTHROPLASTY Right 2014  . SHOULDER SURGERY Right   . thumbs     trigger thumbs    Prior to Admission medications   Medication Sig Start Date End Date Taking? Authorizing Provider  aspirin EC 325 MG tablet Take 325 mg by mouth daily.    [provider]  bisacodyl (DULCOLAX) 5 MG EC tablet Take by mouth.    [provider]  carbidopa-levodopa (SINEMET CR) 50-200 MG per tablet Take 1 tablet by mouth 4 (four) Valdez daily.     [provider]  carbidopa-levodopa (SINEMET IR) 25-100 MG tablet Take 1 tablet by mouth 2 (two) Valdez daily. 07/08/16   [provider]  cefUROXime (CEFTIN) 250 MG tablet Take 1 tablet (250 mg total) by mouth 2 (two) Valdez daily with Lawrence meal. Patient not taking: Reported on 01/09/2015 12/19/14   Mohammed Kindle, MD  cefUROXime (CEFTIN) 250 MG tablet Take 1  tablet (250 mg total) by mouth 2 (two) Valdez daily with Lawrence meal. Patient not taking: Reported on 02/12/2015 01/09/15   Mohammed Kindle, MD  cefUROXime (CEFTIN) 250 MG tablet Take 1 tablet (250 mg total) by mouth 2 (two) Valdez daily with Lawrence meal. Patient not taking: Reported on 03/21/2015 02/27/15   Mohammed Kindle, MD  cephALEXin (KEFLEX) 500 MG capsule Take 1 capsule (500 mg total) by mouth 4 (four) Valdez daily for 10 days. 02/18/17 02/28/17  Sable Feil, PA-C  citalopram (CELEXA) 10 MG tablet Take 20 mg by mouth daily.     [provider]  diclofenac sodium (VOLTAREN) 1 % GEL Apply  topically.    [provider]  finasteride (PROSCAR) 5 MG tablet TAKE 1 TABLET BY MOUTH EVERY DAY 05/21/15   McGowan, Larene Beach A, PA-C  fluticasone (FLONASE) 50 MCG/ACT nasal spray SHAKE LIQUID AND USE 2 SPRAYS IN EACH NOSTRIL DAILY 11/29/15   Roselee Nova, MD  lamoTRIgine (LAMICTAL) 25 MG tablet Wk1:1tab night wk2:1tab 2xday wk3:1tab AM 2tabsPM wk4:2tabs 2xday wk5:2tabs AM 3tabs PM wk6:3tabs 2xday wk7:3tabs AM 4tabs PM, wk8:4tab2xday 06/24/16   [provider]  levETIRAcetam (KEPPRA) 500 MG tablet Take 500 mg by mouth 2 (two) Valdez daily. 07/08/16   [provider]  levothyroxine (SYNTHROID, LEVOTHROID) 75 MCG tablet TAKE 1 TABLET(75 MCG) BY MOUTH DAILY BEFORE BREAKFAST Patient not taking: Reported on 09/08/2016 10/28/15   Roselee Nova, MD  levothyroxine (SYNTHROID, LEVOTHROID) 88 MCG tablet Take 88 mcg by mouth daily before breakfast.    [provider]  lisinopril (PRINIVIL,ZESTRIL) 10 MG tablet Take 1 tablet (10 mg total) by mouth daily. Patient not taking: Reported on 09/08/2016 01/24/15   Roselee Nova, MD  loratadine (CLARITIN) 10 MG tablet Take 1 tablet (10 mg total) by mouth daily. Patient not taking: Reported on 09/08/2016 07/08/15   Roselee Nova, MD  Melatonin 5 MG TABS Take 1 tablet by mouth at bedtime. Reported on 03/21/2015    [provider]  midodrine (PROAMATINE) 2.5 MG tablet Take 2.5 mg by mouth daily. 08/20/16   [provider]  Multiple Vitamin (MULITIVITAMIN WITH MINERALS) TABS Take 1 tablet by mouth daily.    [provider]  mupirocin ointment (BACTROBAN) 2 % Apply to affected area 3 Valdez daily 02/18/17 02/18/18  Sable Feil, PA-C  naproxen sodium (ANAPROX) 220 MG tablet Take by mouth.    [provider]  polyethylene glycol (MIRALAX / GLYCOLAX) packet Take 17 g by mouth at bedtime.    [provider]  pravastatin (PRAVACHOL) 20 MG tablet Take 1 tablet (20 mg total) by mouth daily.  10/11/14   Roselee Nova, MD  Tamsulosin HCl (FLOMAX) 0.4 MG CAPS Take 0.4 mg by mouth daily.    [provider]  traMADol Veatrice Bourbon) 50 MG tablet Limit one half to one tab by mouth per day or twice per day if tolerated 03/21/15   Mohammed Kindle, MD    Allergies No known allergies and Adhesive [tape]  Family History  Problem Relation Age of Onset  . Prostate cancer Brother   . Kidney cancer Neg Hx   . Bladder Cancer Neg Hx     Social History Social History   Tobacco Use  . Smoking status: Former Smoker    Last attempt to quit: 08/04/1961    Years since quitting: 55.5  . Smokeless tobacco: Never Used  Substance Use Topics  . Alcohol use: No  Comment: socially  . Drug use: No    Review of Systems  Constitutional: No fever/chills Eyes: No visual changes. ENT: No sore throat. Right ear pain Cardiovascular: Denies chest pain. Respiratory: Denies shortness of breath. Gastrointestinal: No abdominal pain.  No nausea, no vomiting.  No diarrhea.  No constipation. Genitourinary: Negative for dysuria. Musculoskeletal: Negative for back pain. Skin: Negative for rash. Neurological: Negative for headaches, focal weakness or numbness. Parkinson Endocrine:Depression, hypertension, hypothyroidism.   ____________________________________________   PHYSICAL EXAM:  VITAL SIGNS: ED Triage Vitals  Enc Vitals Group     BP 02/18/17 1113 102/66     Pulse Rate 02/18/17 1113 67     Resp 02/18/17 1113 15     Temp 02/18/17 1113 98.1 F (36.7 C)     Temp Source 02/18/17 1113 Oral     SpO2 02/18/17 1113 96 %     Weight 02/18/17 1114 170 lb (77.1 kg)     Height 02/18/17 1114 5\' 6"  (1.676 m)     Head Circumference --      Peak Flow --      Pain Score --      Pain Loc --      Pain Edu? --      Excl. in [place]? --     Constitutional: Alert and oriented. Well appearing and in no acute distress. Neck: No stridor.  Hematological/Lymphatic/Immunilogical: No cervical  lymphadenopathy. Cardiovascular: Normal rate, regular rhythm. Grossly normal heart sounds.  Good peripheral circulation. Respiratory: Normal respiratory effort.  No retractions. Lungs CTAB. Neurologic:  Normal speech and language. No gross focal neurologic deficits are appreciated. No gait instability. Skin:  Skin is warm, dry and intact. Erythematous and edematous papular lesion which is nonfluctuant inside of right ear. Psychiatric: Mood and affect are normal. Speech and behavior are normal.  ____________________________________________   LABS (all labs ordered are listed, Lawrence only abnormal results are displayed)  Labs Reviewed - No data to display ____________________________________________  EKG   ____________________________________________  RADIOLOGY  No results found.  ____________________________________________   PROCEDURES  Procedure(s) performed: None  Procedures  Critical Care performed: No  ____________________________________________   INITIAL IMPRESSION / ASSESSMENT AND PLAN / ED COURSE  As part of my medical decision making, I reviewed Lawrence following data within Lawrence electronic MEDICAL RECORD NUMBER    Localized infected pimple right ear. Patient was given discharge care instructions. Advised to take medication as directed. Advised to follow-up for ENT clinic by calling for an appointment for definitive evaluation.      ____________________________________________   FINAL CLINICAL IMPRESSION(S) / ED DIAGNOSES  Final diagnoses:  Localized bacterial skin infection     ED Discharge Orders        Ordered    cephALEXin (KEFLEX) 500 MG capsule  4 Valdez daily     02/18/17 1206    mupirocin ointment (BACTROBAN) 2 %     02/18/17 1206       Note:  This document was prepared using Dragon voice recognition software and may include unintentional dictation errors.    Sable Feil, PA-C 02/18/17 1215    Lavonia Drafts, MD 02/18/17 1310

## 2017-02-19 ENCOUNTER — Ambulatory Visit: Payer: Self-pay | Admitting: *Deleted

## 2017-02-19 DIAGNOSIS — F5105 Insomnia due to other mental disorder: Secondary | ICD-10-CM | POA: Diagnosis not present

## 2017-02-19 DIAGNOSIS — F331 Major depressive disorder, recurrent, moderate: Secondary | ICD-10-CM | POA: Diagnosis not present

## 2017-02-19 DIAGNOSIS — F060 Psychotic disorder with hallucinations due to known physiological condition: Secondary | ICD-10-CM | POA: Diagnosis not present

## 2017-02-22 ENCOUNTER — Other Ambulatory Visit: Payer: Self-pay | Admitting: *Deleted

## 2017-02-22 NOTE — Patient Outreach (Addendum)
  [facility] [facility]) Care Management  02/22/2017  Lawrence VONDRAK Sr. 08/07/1931 [id number]  MD office referral; Cordelia Poche unable to care for patient who is unable to walk. Patient has dx Parkinson's disease. Spouse is primary contact.   Return call from patient's spouse who advises that she is patient's healthcare power of attorney and care giver. States patient has Parkinson's disease and is unable to care for himself. States he does have some psycho-social issues also. Also has balance issues, can't pivot and history of Stroke.  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 States she manages his medications & takes him to MD appointments & is competent managing his chronic condition. States her current plans are to keep patient at home and care for him as long as she is able.   States currently she has no one to back her up to help her care for him. States no family available to help.. Voices she needs respite care. States she needs equipment to help better care for him but unable to purchase due to limited funds. States he needs new hospital bed. Currently has rolling walker, wheelchair. ramp and shower chair. Voices funds are limited. States they have not applied for Medicaid. States she is unable to leave patient alone so has been unable to go to social services office.    Spouse/caregiver agrees to referral to [place] Worker for community resources-respite care, ? financial assistance for hospital bed & Handicap attachments, ? Assistance with  long term care planning when appropriate.   Plan: Refer to Clinical Social Worker for complex case management. Telephonic signing off.     Sherrin Daisy, RN BSN [place] Management Coordinator [facility] Care Management  [phone]

## 2017-02-23 ENCOUNTER — Ambulatory Visit (INDEPENDENT_AMBULATORY_CARE_PROVIDER_SITE_OTHER): Payer: PPO | Admitting: Podiatry

## 2017-02-23 ENCOUNTER — Ambulatory Visit (INDEPENDENT_AMBULATORY_CARE_PROVIDER_SITE_OTHER): Payer: PPO

## 2017-02-23 ENCOUNTER — Encounter: Payer: Self-pay | Admitting: Podiatry

## 2017-02-23 DIAGNOSIS — M25473 Effusion, unspecified ankle: Secondary | ICD-10-CM | POA: Diagnosis not present

## 2017-02-23 DIAGNOSIS — R2681 Unsteadiness on feet: Secondary | ICD-10-CM | POA: Diagnosis not present

## 2017-02-23 DIAGNOSIS — G20 Parkinson's disease: Secondary | ICD-10-CM

## 2017-02-23 DIAGNOSIS — R600 Localized edema: Secondary | ICD-10-CM

## 2017-02-23 DIAGNOSIS — M659 Synovitis and tenosynovitis, unspecified: Secondary | ICD-10-CM

## 2017-02-23 MED ORDER — TRAMADOL HCL 50 MG PO TABS: 50.0000 mg | ORAL_TABLET | Freq: Three times a day (TID) | ORAL | 0 refills | Status: DC | PRN

## 2017-02-23 NOTE — Progress Notes (Signed)
   HPI: 83 year old male presents the office today as a new patient for evaluation of bilateral lower extremity swelling with pain.  Patient states that it is painful during ambulation.  Patient states that he does have a history of Parkinson's disease.  This pain in the ankles and swelling has been ongoing for several months now.  Pain is most significant when walking.  No alleviating factors other than rest and nonweightbearing.  Instability of gait.  The patient is concerned that he will trip and fall at any moment.  This is likely complicated by his Parkinson's disease.  He has a hard time pivoting on his left lower extremity.  The patient also complains of he presents today with his wife for further treatment and evaluation  Past Medical History:  Diagnosis Date  . Arthritis   . Bradycardia   . Chronic kidney disease    enlarged prostrate  . Depression   . Dysrhythmia   . Hypertension   . Hypothyroid   . Hypothyroidism   . Pacemaker   . Parkinson disease ([place])   . Vertigo      Physical Exam: General: The patient is alert and oriented x3 in no acute distress.  Dermatology: Skin is warm, dry and supple bilateral lower extremities. Negative for open lesions or macerations.  Vascular: Palpable pedal pulses bilaterally. No edema or erythema noted. Capillary refill within normal limits.   Pitting edema noted throughout the foot and ankle structures to bilateral lower extremities extending just proximal to the ankle joints.  Neurological: Neurovascular status intact.  Epicritic and protective threshold grossly intact bilaterally.   Musculoskeletal Exam: Range of motion within normal limits to all pedal and ankle joints bilateral. Muscle strength 5/5 in all groups bilateral.  There is some pain on palpation noted to the anterior medial and lateral aspect of the patient's left ankle joint consistent with an ankle joint synovitis  Radiographic Exam:  Normal osseous mineralization. Joint  spaces preserved. No fracture/dislocation/boney destruction.    Assessment: -Parkinson disease -Synovitis of left ankle -Gait instability -bilateral lower extremity edema   Plan of Care:  -Patient was evaluated today.  X-rays reviewed today. -Injection of 0.5 cc Celestone Soluspan injected in the left ankle joint -Recommend the patient wear bilateral lower extremity compression knee-high stockings, over-the-counter purchased at the pharmacy -Appointment with Liliane Channel for balance bracing.  This will be medically necessary and should benefit the patient significantly with ambulation and gait instability -Return to clinic as needed  Edrick Kins, DPM [facility]  Dr. Edrick Kins, DPM    [address], [place] [address]                Office [phone]  Fax [phone]

## 2017-02-26 ENCOUNTER — Other Ambulatory Visit: Payer: Self-pay | Admitting: *Deleted

## 2017-02-26 DIAGNOSIS — Z8673 Personal history of transient ischemic attack (TIA), and cerebral infarction without residual deficits: Secondary | ICD-10-CM | POA: Diagnosis not present

## 2017-02-26 DIAGNOSIS — M1990 Unspecified osteoarthritis, unspecified site: Secondary | ICD-10-CM | POA: Diagnosis not present

## 2017-02-26 DIAGNOSIS — Z87891 Personal history of nicotine dependence: Secondary | ICD-10-CM | POA: Diagnosis not present

## 2017-02-26 DIAGNOSIS — Z950 Presence of cardiac pacemaker: Secondary | ICD-10-CM | POA: Diagnosis not present

## 2017-02-26 DIAGNOSIS — I951 Orthostatic hypotension: Secondary | ICD-10-CM | POA: Diagnosis not present

## 2017-02-26 DIAGNOSIS — Z7952 Long term (current) use of systemic steroids: Secondary | ICD-10-CM | POA: Diagnosis not present

## 2017-02-26 DIAGNOSIS — Z96652 Presence of left artificial knee joint: Secondary | ICD-10-CM | POA: Diagnosis not present

## 2017-02-26 DIAGNOSIS — R32 Unspecified urinary incontinence: Secondary | ICD-10-CM | POA: Diagnosis not present

## 2017-02-26 DIAGNOSIS — F329 Major depressive disorder, single episode, unspecified: Secondary | ICD-10-CM | POA: Diagnosis not present

## 2017-02-26 DIAGNOSIS — Z9181 History of falling: Secondary | ICD-10-CM | POA: Diagnosis not present

## 2017-02-26 DIAGNOSIS — G20 Parkinson's disease: Secondary | ICD-10-CM | POA: Diagnosis not present

## 2017-02-26 DIAGNOSIS — I10 Essential (primary) hypertension: Secondary | ICD-10-CM | POA: Diagnosis not present

## 2017-02-26 DIAGNOSIS — Z7982 Long term (current) use of aspirin: Secondary | ICD-10-CM | POA: Diagnosis not present

## 2017-02-26 NOTE — Patient Outreach (Signed)
[facility]) Care Management  02/26/2017  Lawrence LIPPY Sr. [birth year] [id number]   Patient referred to this [facility] social worker to assist with community resources for a new hospital bed, medicaid, and respite care. This Education officer, museum spoke with patient's spouse, Lawrence Valdez who stated that the [facility] health nurse was at the home at and requested a call back. She briefly discussed needing help with a new hospital bed as she cannot afford the monthly co-pay of $50.00. She also requested resources for assistance with remodeling their bathroom to make giving patient a shower more accessible.   Plan: This social worker will plan to call patient's spouse within the next week to complete social work assessment.   Sheralyn Boatman [facility] Care Management [phone]

## 2017-03-02 ENCOUNTER — Other Ambulatory Visit: Payer: Self-pay | Admitting: *Deleted

## 2017-03-02 NOTE — Patient Outreach (Signed)
[facility]) Care Management  03/02/2017  Lawrence Valdez Sr. 1931/08/04 [id number]   Phone call to patient  to assess for community resource needs. Spoke with patient's spouse initially who gave verbal permission to speak to his spouse. Per patient;s spouse, she is overwhelmed with patient's care and needs assistance. She also discussed needing assistance with getting their bathroom modified in order to safely give patient a bath.  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 Per patient, the have limited family and friend support. This social worker emphasized self care and provided the following reosurces to assist with her respite care needs and bathroom modification. Contact information provided for the case management program through the [place] to assist with respite care. Patient's spouse encouraged to call [phone] to self refer. Patient's spouse also given the contact number for Independent living -contact Lawrence Valdez [phone]- to request assistance with her bathroom modification. Per patient's spouse, she has started the process of applying for VA benefits in June of 2018 but never followed through. Per patient, she will start the process again after the first of the year. Patient's spouse questioned this social worker's role, and it was explained that this Education officer, museum would assist by linking her to resources but she would need to make the initial calls. This Education officer, museum encouraged her to call if she ran into problems after the initial calls were made. Per patient, she will call after the first of the year.This social worker recommended that she call as soon as possible. This Education officer, museum provided patient's spouse with encouragement to follow up on resources provided and commended her on  the care provided to spouse.    Plan: This  Education officer, museum to follow up with patient's spouse regarding resources provided and assistance needed within 3 weeks.   Lawrence Valdez [facility] Care Management [phone]

## 2017-03-08 DIAGNOSIS — I10 Essential (primary) hypertension: Secondary | ICD-10-CM | POA: Diagnosis not present

## 2017-03-11 ENCOUNTER — Emergency Department: Payer: PPO

## 2017-03-11 ENCOUNTER — Other Ambulatory Visit: Payer: Self-pay

## 2017-03-11 ENCOUNTER — Emergency Department
Admission: EM | Admit: 2017-03-11 | Discharge: 2017-03-12 | Disposition: A | Discharge status: Home / Self Care | Payer: PPO | Attending: Emergency Medicine | Admitting: Emergency Medicine

## 2017-03-11 DIAGNOSIS — Z87891 Personal history of nicotine dependence: Secondary | ICD-10-CM | POA: Insufficient documentation

## 2017-03-11 DIAGNOSIS — Z791 Long term (current) use of non-steroidal anti-inflammatories (NSAID): Secondary | ICD-10-CM | POA: Diagnosis not present

## 2017-03-11 DIAGNOSIS — M6281 Muscle weakness (generalized): Secondary | ICD-10-CM | POA: Insufficient documentation

## 2017-03-11 DIAGNOSIS — E876 Hypokalemia: Secondary | ICD-10-CM | POA: Insufficient documentation

## 2017-03-11 DIAGNOSIS — E785 Hyperlipidemia, unspecified: Secondary | ICD-10-CM

## 2017-03-11 DIAGNOSIS — N189 Chronic kidney disease, unspecified: Secondary | ICD-10-CM | POA: Insufficient documentation

## 2017-03-11 DIAGNOSIS — Z79899 Other long term (current) drug therapy: Secondary | ICD-10-CM | POA: Diagnosis not present

## 2017-03-11 DIAGNOSIS — Z7982 Long term (current) use of aspirin: Secondary | ICD-10-CM | POA: Diagnosis not present

## 2017-03-11 DIAGNOSIS — I10 Essential (primary) hypertension: Secondary | ICD-10-CM

## 2017-03-11 DIAGNOSIS — I129 Hypertensive chronic kidney disease with stage 1 through stage 4 chronic kidney disease, or unspecified chronic kidney disease: Secondary | ICD-10-CM | POA: Insufficient documentation

## 2017-03-11 DIAGNOSIS — G20 Parkinson's disease: Secondary | ICD-10-CM | POA: Insufficient documentation

## 2017-03-11 DIAGNOSIS — E039 Hypothyroidism, unspecified: Secondary | ICD-10-CM | POA: Diagnosis not present

## 2017-03-11 DIAGNOSIS — G8929 Other chronic pain: Secondary | ICD-10-CM | POA: Diagnosis not present

## 2017-03-11 DIAGNOSIS — I6200 Nontraumatic subdural hemorrhage, unspecified: Secondary | ICD-10-CM | POA: Diagnosis not present

## 2017-03-11 DIAGNOSIS — R51 Headache: Secondary | ICD-10-CM | POA: Insufficient documentation

## 2017-03-11 DIAGNOSIS — R531 Weakness: Secondary | ICD-10-CM | POA: Diagnosis not present

## 2017-03-11 DIAGNOSIS — R519 Headache, unspecified: Secondary | ICD-10-CM

## 2017-03-11 DIAGNOSIS — Z950 Presence of cardiac pacemaker: Secondary | ICD-10-CM | POA: Diagnosis not present

## 2017-03-11 DIAGNOSIS — I618 Other nontraumatic intracerebral hemorrhage: Secondary | ICD-10-CM | POA: Diagnosis not present

## 2017-03-11 LAB — BASIC METABOLIC PANEL
Anion gap: 8 (ref 5–15)
BUN: 12 mg/dL (ref 6–20)
CHLORIDE: 101 mmol/L (ref 101–111)
CO2: 31 mmol/L (ref 22–32)
CREATININE: 0.87 mg/dL (ref 0.61–1.24)
Calcium: 8.8 mg/dL — ABNORMAL LOW (ref 8.9–10.3)
GFR calc non Af Amer: >60 mL/min (ref >60)
Glucose, Bld: 131 mg/dL — ABNORMAL HIGH (ref 65–99)
Potassium: 2.7 mmol/L — CL (ref 3.5–5.1)
SODIUM: 140 mmol/L (ref 135–145)

## 2017-03-11 LAB — URINALYSIS, COMPLETE (UACMP) WITH MICROSCOPIC
Bacteria, UA: NONE SEEN
Bilirubin Urine: NEGATIVE
GLUCOSE, UA: NEGATIVE mg/dL
HGB URINE DIPSTICK: NEGATIVE
Ketones, ur: 5 mg/dL — AB
Leukocytes, UA: NEGATIVE
NITRITE: NEGATIVE
PROTEIN: 30 mg/dL — AB
SPECIFIC GRAVITY, URINE: 1.012 (ref 1.005–1.030)
pH: 7 (ref 5.0–8.0)

## 2017-03-11 LAB — CBC
HCT: 39.1 % — ABNORMAL LOW (ref 40.0–52.0)
HEMOGLOBIN: 13.3 g/dL (ref 13.0–18.0)
MCH: 31.4 pg (ref 26.0–34.0)
MCHC: 34.1 g/dL (ref 32.0–36.0)
MCV: 92 fL (ref 80.0–100.0)
Platelets: 108 10*3/uL — ABNORMAL LOW (ref 150–440)
RBC: 4.25 MIL/uL — AB (ref 4.40–5.90)
RDW: 14.1 % (ref 11.5–14.5)
WBC: 5.1 10*3/uL (ref 3.8–10.6)

## 2017-03-11 MED ORDER — LABETALOL HCL 5 MG/ML IV SOLN
10.0000 mg | Freq: Once | INTRAVENOUS | Status: AC
  Administered 2017-03-11: 10 mg via INTRAVENOUS
  Filled 2017-03-11: qty 4

## 2017-03-11 MED ORDER — SODIUM CHLORIDE 0.9 % IV BOLUS (SEPSIS)
500.0000 mL | Freq: Once | INTRAVENOUS | Status: AC
  Administered 2017-03-11: 500 mL via INTRAVENOUS

## 2017-03-11 MED ORDER — POTASSIUM CHLORIDE 10 MEQ/100ML IV SOLN
10.0000 meq | Freq: Once | INTRAVENOUS | Status: AC
  Administered 2017-03-11: 10 meq via INTRAVENOUS
  Filled 2017-03-11 (×2): qty 100

## 2017-03-11 MED ORDER — LISINOPRIL 10 MG PO TABS
10.0000 mg | ORAL_TABLET | Freq: Once | ORAL | Status: AC
  Administered 2017-03-11: 10 mg via ORAL
  Filled 2017-03-11: qty 1

## 2017-03-11 MED ORDER — POTASSIUM CHLORIDE CRYS ER 20 MEQ PO TBCR
40.0000 meq | EXTENDED_RELEASE_TABLET | Freq: Once | ORAL | Status: AC
  Administered 2017-03-11: 40 meq via ORAL
  Filled 2017-03-11: qty 2

## 2017-03-11 MED ORDER — TRAMADOL HCL 50 MG PO TABS
50.0000 mg | ORAL_TABLET | Freq: Every day | ORAL | Status: DC
  Administered 2017-03-11: 50 mg via ORAL
  Filled 2017-03-11: qty 1

## 2017-03-11 NOTE — ED Provider Notes (Addendum)
[facility] Emergency Department Provider Note ____________________________________________   I have reviewed the triage vital signs and the triage nursing note.  HISTORY  Chief Complaint Headache   Historian Patient and daughter with whom he lives and she takes care of him HPI Lawrence DELANGE Sr. is a 83 y.o. male with a history of Parkinson and prior stroke, presents due to headache for a week or 2, at times worse than others, currently 2 out of 10.  Nothing seems to make it worse or better.  No history of chronic headaches.  No vision changes.  No slurred speech or altered mental status.  No focal neurologic deficits reported.  Denies vision changes.  He has had some minor coughing.  No chest pain.  Patient is also had generalized weakness worse over the past week or so.  It has been harder for the daughter to actually stand him up on his own and help him get around.  She is highly committed to keeping him at home and is not interested in rehab at this point in time.  She is mostly interested in making sure he does not have a underlying reversible cause for his generalized fatigue and headache.    Past Medical History:  Diagnosis Date  . Arthritis   . Bradycardia   . Chronic kidney disease    enlarged prostrate  . Depression   . Dysrhythmia   . Hypertension   . Hypothyroid   . Hypothyroidism   . Pacemaker   . Parkinson disease ([place])   . Vertigo     Patient Active Problem List   Diagnosis Date Noted  . At risk for falls 09/29/2016  . 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 Seizure disorder ([place]) 05/05/2016  . Chronic bilateral low back pain without sciatica 05/05/2016  . Hx of thrombocytopenia 11/07/2015  . Normocytic anemia 07/17/2015  . History of sick sinus syndrome 04/16/2015  . Chest wall pain 11/29/2014  . Hypertension 10/11/2014  . Hyperlipidemia 10/11/2014  . Idiopathic Parkinson's disease ([place]) 05/02/2014  . Leg  pain, right 05/02/2014  . Back pain, chronic 12/28/2013  . Clinical depression 08/29/2013  . Has a tremor 08/29/2013  . Mechanical and motor problems with internal organs 08/29/2013  . Disordered sleep 08/29/2013  . Amnesia 08/29/2013  . Fatigue 08/29/2013  . Fall 08/29/2013  . Dizziness 08/29/2013  . Difficulty in walking 08/29/2013  . Recurrent major depressive disorder, in partial remission ([place]) 08/29/2013  . Adult hypothyroidism 06/16/2013  . Acquired trigger finger 06/16/2013  . Arrhythmia, sinus node 06/16/2013  . HLD (hyperlipidemia) 06/16/2013  . Benign essential HTN 06/16/2013  . Pure hypercholesterolemia 06/16/2013    Past Surgical History:  Procedure Laterality Date  . APPENDECTOMY    . CARDIAC CATHETERIZATION     09  . CATARACT EXTRACTION    . HERNIA REPAIR  20 years ago   umbiluical   . HERNIA REPAIR Left 07-25-13   inguinal  . INSERT / REPLACE / REMOVE PACEMAKER     dr Ilona Sorrel     09  . JOINT REPLACEMENT     lt knee  . JOINT REPLACEMENT     right hip  . KNEE SURGERY Left   . LUMBAR LAMINECTOMY/DECOMPRESSION MICRODISCECTOMY  08/06/2011   Procedure: LUMBAR LAMINECTOMY/DECOMPRESSION MICRODISCECTOMY 2 LEVELS;  Surgeon: Eustace Moore, MD;  Location: [facility];  Service: Neurosurgery;  Laterality: N/A;  Lumbar two three, lumbar three four decompressive laminectomy  . PARTIAL  HIP ARTHROPLASTY Right 2014  . SHOULDER SURGERY Right   . thumbs     trigger thumbs    Prior to Admission medications   Medication Sig Start Date End Date Taking? Authorizing Provider  aspirin EC 325 MG tablet Take 325 mg by mouth daily.   Yes [provider]  carbidopa-levodopa (SINEMET CR) 50-200 MG per tablet Take 1 tablet by mouth 3 (three) times daily.    Yes [provider]  citalopram (CELEXA) 20 MG tablet Take 20 mg by mouth daily.    Yes [provider]  finasteride (PROSCAR) 5 MG tablet TAKE 1 TABLET BY MOUTH EVERY DAY 05/21/15  Yes McGowan,  Larene Beach A, PA-C  fludrocortisone (FLORINEF) 0.1 MG tablet Take 0.05 mg by mouth 2 (two) times daily.   Yes [provider]  lamoTRIgine (LAMICTAL) 150 MG tablet Take 1 tablet by mouth twice daily 06/24/16  Yes [provider]  levothyroxine (SYNTHROID, LEVOTHROID) 88 MCG tablet Take 88 mcg by mouth daily before breakfast.   Yes [provider]  lisinopril (PRINIVIL,ZESTRIL) 10 MG tablet Take 1 tablet (10 mg total) by mouth daily. 01/24/15  Yes Roselee Nova, MD  Multiple Vitamin (MULITIVITAMIN WITH MINERALS) TABS Take 1 tablet by mouth daily.   Yes [provider]  pravastatin (PRAVACHOL) 20 MG tablet Take 1 tablet (20 mg total) by mouth daily. 10/11/14  Yes Roselee Nova, MD  risperiDONE (RISPERDAL) 2 MG tablet Take 2 mg by mouth at bedtime.   Yes [provider]  bisacodyl (DULCOLAX) 5 MG EC tablet Take by mouth.    [provider]  diclofenac sodium (VOLTAREN) 1 % GEL Apply topically.    [provider]  fluticasone (FLONASE) 50 MCG/ACT nasal spray SHAKE LIQUID AND USE 2 SPRAYS IN EACH NOSTRIL DAILY 11/29/15   Roselee Nova, MD  levothyroxine (SYNTHROID, LEVOTHROID) 75 MCG tablet TAKE 1 TABLET(75 MCG) BY MOUTH DAILY BEFORE BREAKFAST Patient not taking: Reported on 03/11/2017 10/28/15   Roselee Nova, MD  loratadine (CLARITIN) 10 MG tablet Take 1 tablet (10 mg total) by mouth daily. Patient not taking: Reported on 09/08/2016 07/08/15   Roselee Nova, MD  Melatonin 5 MG TABS Take 1 tablet by mouth at bedtime. Reported on 03/21/2015    [provider]  naproxen sodium (ANAPROX) 220 MG tablet Take by mouth.    [provider]  traMADol (ULTRAM) 50 MG tablet Limit one half to one tab by mouth per day or twice per day if tolerated Patient not taking: Reported on 03/11/2017 03/21/15   Mohammed Kindle, MD  traMADol (ULTRAM) 50 MG tablet Take 1 tablet (50 mg total) by mouth every 8 (eight) hours as needed. 02/23/17    Edrick Kins, DPM    Allergies  Allergen Reactions  . No Known Allergies   . Adhesive [Tape] Itching and Other (See Comments)    blisters    Family History  Problem Relation Age of Onset  . Prostate cancer Brother   . Kidney cancer Neg Hx   . Bladder Cancer Neg Hx     Social History Social History   Tobacco Use  . Smoking status: Former Smoker    Last attempt to quit: 08/04/1961    Years since quitting: 55.6  . Smokeless tobacco: Never Used  Substance Use Topics  . Alcohol use: No    Comment: socially  . Drug use: No    Review of Systems  Constitutional:  Negative for any recent fevers. Eyes: Negative for visual changes. ENT: Negative for sore throat. Cardiovascular: Negative for chest pain. Respiratory: Negative for shortness of breath. Gastrointestinal: Negative for abdominal pain, vomiting and diarrhea. Genitourinary: Negative for dysuria. Musculoskeletal: Negative for back pain. Skin: Negative for rash. Neurological: Positive for headache, mild and global.  ____________________________________________   PHYSICAL EXAM:  VITAL SIGNS: ED Triage Vitals  Enc Vitals Group     BP 03/11/17 1133 (!) 170/89     Pulse Rate 03/11/17 1133 68     Resp 03/11/17 1133 16     Temp 03/11/17 1133 98.2 F (36.8 C)     Temp Source 03/11/17 1133 Oral     SpO2 03/11/17 1133 95 %     Weight 03/11/17 1133 170 lb (77.1 kg)     Height 03/11/17 1133 5\' 6"  (1.676 m)     Head Circumference --      Peak Flow --      Pain Score 03/11/17 1132 5     Pain Loc --      Pain Edu? --      Excl. in [place]? --      Constitutional: Alert and oriented. Well appearing and in no distress. HEENT   Head: Normocephalic and atraumatic.      Eyes: Conjunctivae are normal. Pupils equal and round.       Ears:         Nose: No congestion/rhinnorhea.   Mouth/Throat: Mucous membranes are moist.   Neck: No stridor. Cardiovascular/Chest: Normal rate, regular rhythm.  No murmurs, rubs,  or gallops. Respiratory: Normal respiratory effort without tachypnea nor retractions. Breath sounds are clear and equal bilaterally. No wheezes/rales/rhonchi. Gastrointestinal: Soft. No distention, no guarding, no rebound. Nontender.    Genitourinary/rectal:Deferred Musculoskeletal: Nontender with normal range of motion in all extremities. No joint effusions.  No lower extremity tenderness.  No edema. Neurologic: No facial droop.  No slurred speech.  Cranial nerves II through X intact.  Normal speech and language. No gross or focal neurologic deficits are appreciated.  Somewhat generalized weakness, but no focal weakness or numbness in the 4 extremities.  No sensory changes. Skin:  Skin is warm, dry and intact. No rash noted. Psychiatric: Mood and affect are normal. Speech and behavior are normal. Patient exhibits appropriate insight and judgment.   ____________________________________________  LABS (pertinent positives/negatives) I, Lisa Roca, MD the attending physician have reviewed the labs noted below.  Labs Reviewed  CBC - Abnormal; Notable for the following components:      Result Value   RBC 4.25 (*)    HCT 39.1 (*)    Platelets 108 (*)    All other components within normal limits  BASIC METABOLIC PANEL - Abnormal; Notable for the following components:   Potassium 2.7 (*)    Glucose, Bld 131 (*)    Calcium 8.8 (*)    All other components within normal limits  URINALYSIS, COMPLETE (UACMP) WITH MICROSCOPIC - Abnormal; Notable for the following components:   Color, Urine YELLOW (*)    APPearance CLEAR (*)    Ketones, ur 5 (*)    Protein, ur 30 (*)    Squamous Epithelial / LPF 0-5 (*)    All other components within normal limits    ____________________________________________    EKG I, Lisa Roca, MD, the attending physician have personally viewed and interpreted all ECGs.  69 bpm.  Undetermined rhythm, likely sinus.  Narrow QRS.  Left axis deviation.  Nonspecific ST  and  T wave ____________________________________________  RADIOLOGY All Xrays were viewed by me.  Imaging interpreted by Radiologist, and I, Lisa Roca, MD the attending physician have reviewed the radiologist interpretation noted below.  CT head without contrast:    CLINICAL DATA: Headache for 2 days. History of stroke and Parkinson's disease.  EXAM: CT HEAD WITHOUT CONTRAST  TECHNIQUE: Contiguous axial images were obtained from the base of the skull through the vertex without intravenous contrast.  COMPARISON: 09/08/2016  FINDINGS: Brain: There is an approximately 1.7 cm area of acute/subacute intraparenchymal hemorrhage in the right parietal lobe with small subarachnoid extension. There is no evidence of mass effect. Stable ex vacuo dilatation of the ventricles due to brain parenchymal volume loss and marked periventricular microangiopathy. Stable left lentiform nucleus lacunar infarct.  Vascular: Calcific atherosclerotic disease at the skullbase.  Skull: Normal. Negative for fracture or focal lesion.  Sinuses/Orbits: No acute finding.  Other: None.  IMPRESSION: Acute/ subacute intraparenchymal hemorrhage in the right parietal lobe, with small subarachnoid extension. No evidence of significant mass effect. This may represent hemorrhagic infarct or de novo intraparenchymal hemorrhage.  Marked brain parenchymal atrophy and deep white matter microangiopathy.  These results were called by telephone at the time of interpretation on 03/11/2017 at 1:05 pm to Dr. Lisa Roca , who verbally acknowledged these results.      __________________________________________  PROCEDURES  Procedure(s) performed: None  Critical Care performed: None   ____________________________________________  ED COURSE / ASSESSMENT AND PLAN  Pertinent labs & imaging results that were available during my care of the patient were reviewed by me and considered in my medical decision  making (see chart for details).    Patient does seem a little bit fatigued in general, but he is alert and clear without acute distress.  He has no focal neurologic deficits.  His headache is mild in general and has been there for at least a week if not longer.  CT scan does show intraparenchymal bleed with subarachnoid extension.  Without focal neurologic deficit, acute versus subacute read, I discussed with [place] neurosurgeon, Dr. Izora Ribas who will come see the patient in the ED.  On laboratory studies patient found to have hypokalemia.  Dr. Cari Caraway recommended 6-hour repeat head CT, suspects more likely subacute bleed, patient really without any focal symptoms related to this.  He recommends off aspirin, okay for discharge or rehab placement.  He is recommending 71-month follow-up for MRI.  Daughter and patient are on board for consideration of short-term acute care rehab, consulted PT and social work.  Patient care will be transferred to Dr. Jimmye Norman at shift change 3 PM.  Patient disposition upon CT results scheduled for 6 hours, 6:30 PM this evening, and social work/PT eval.     DIFFERENTIAL DIAGNOSIS: Including but not limited to TIA, vertigo, electrolyte disturbance, urinary tract infection, viral syndrome, deconditioning, dehydration, cardiac etiologies, etc.  CONSULTATIONS:   Dr. Cari Caraway, neurosurgery, to see patient in the ED. school therapy and social work consultation to work on short-term acute care rehab placement.   Patient / Family / Caregiver informed of clinical course, medical decision-making process, and agree with plan.   Addended to include, plan will be for patient to be observed in the hospital overnight for nursing home placement, unable to be completed tonight in the ER due to after hours repeat head CT.  Patient will have repeat head CT around 630.  I spoke with hospitalist, after CT confirms no uncontrolled or worsening bleeding, patient would be able  to  be observed/admitted to hospital.  ___________________________________________   FINAL CLINICAL IMPRESSION(S) / ED DIAGNOSES   Final diagnoses:  Nonintractable headache, unspecified chronicity pattern, unspecified headache type  Generalized weakness  Hypokalemia      ___________________________________________        Note: This dictation was prepared with Dragon dictation. Any transcriptional errors that result from this process are unintentional    Lisa Roca, MD 03/11/17 1441    Lisa Roca, MD 03/11/17 [phone]

## 2017-03-11 NOTE — ED Notes (Signed)
Wife attempting to obtain urine sample.

## 2017-03-11 NOTE — ED Notes (Signed)
Patient ate 75% of meal.

## 2017-03-11 NOTE — ED Notes (Signed)
Patient transported to CT 

## 2017-03-11 NOTE — ED Provider Notes (Addendum)
Patient's repeat CT head is relatively unchanged.  Patient is asymptomatic at this time.  I discussed with the hospitalist who has declined admitting him unless he is comfort measures.  We will board him in the ER and have social work see him in the morning.   Earleen Newport, MD 03/11/17 Lanetta Inch    Earleen Newport, MD 03/11/17 [phone]

## 2017-03-11 NOTE — Consult Note (Signed)
Referring Physician:  No referring provider defined for this encounter.  Primary Physician:  Dion Body, MD  Chief Complaint:  Intraparenchymal hemorrhage  History of Present Illness: 03/11/2017 Lawrence Coffee Sr. is a 83 y.o. male who presents with the chief complaint of unsteadiness, not acting himself.  He was unable to stand today.    He lives with his wife, who is present today.  He began having seizures in June 2018, and has been going downhill functionally since June.  His wife is doing most of his ADLs at this point.   He came to the ER today with symptoms of a headache for the past week, as well as some fatigue. He denies nausea, vomiting, or signs or symptoms of seizure.  Nothing provoked his headache - no trauma.   The symptoms are causing a significant impact on the patient's life.   Review of Systems:  A 10 point review of systems is negative, except for the pertinent positives and negatives detailed in the HPI.  Past Medical History: Past Medical History:  Diagnosis Date  . Arthritis   . Bradycardia   . Chronic kidney disease    enlarged prostrate  . Depression   . Dysrhythmia   . Hypertension   . Hypothyroid   . Hypothyroidism   . Pacemaker   . Parkinson disease ([place])   . Vertigo     Past Surgical History: Past Surgical History:  Procedure Laterality Date  . APPENDECTOMY    . CARDIAC CATHETERIZATION     09  . CATARACT EXTRACTION    . HERNIA REPAIR  20 years ago   umbiluical   . HERNIA REPAIR Left 07-25-13   inguinal  . INSERT / REPLACE / REMOVE PACEMAKER     dr Ilona Sorrel     09  . JOINT REPLACEMENT     lt knee  . JOINT REPLACEMENT     right hip  . KNEE SURGERY Left   . LUMBAR LAMINECTOMY/DECOMPRESSION MICRODISCECTOMY  08/06/2011   Procedure: LUMBAR LAMINECTOMY/DECOMPRESSION MICRODISCECTOMY 2 LEVELS;  Surgeon: Eustace Moore, MD;  Location: [facility];  Service: Neurosurgery;  Laterality: N/A;  Lumbar two three, lumbar three  four decompressive laminectomy  . PARTIAL HIP ARTHROPLASTY Right 2014  . SHOULDER SURGERY Right   . thumbs     trigger thumbs    Allergies: Allergies as of 03/11/2017 - Review Complete 03/11/2017  Allergen Reaction Noted  . No known allergies  07/04/2014  . Adhesive [tape] Itching and Other (See Comments) 10/01/2011    Medications:  Current Facility-Administered Medications:  .  bupivacaine (PF) (MARCAINE) 0.25 % injection 30 mL, 30 mL, Other, Once, Mohammed Kindle, MD .  bupivacaine (PF) (MARCAINE) 0.25 % injection 30 mL, 30 mL, Other, Once, Mohammed Kindle, MD .  ceFAZolin (ANCEF) IVPB 1 g/50 mL premix, 1 g, Intravenous, Once, Mohammed Kindle, MD .  ceFAZolin (ANCEF) IVPB 1 g/50 mL premix, 1 g, Intravenous, Once, Mohammed Kindle, MD .  fentaNYL (SUBLIMAZE) injection 100 mcg, 100 mcg, Intravenous, Once, Mohammed Kindle, MD .  lactated ringers infusion 1,000 mL, 1,000 mL, Intravenous, Continuous, Mohammed Kindle, MD .  lactated ringers infusion 1,000 mL, 1,000 mL, Intravenous, Continuous, Mohammed Kindle, MD .  lidocaine (PF) (XYLOCAINE) 1 % injection 10 mL, 10 mL, Subcutaneous, Once, Mohammed Kindle, MD .  midazolam (VERSED) 5 MG/5ML injection 5 mg, 5 mg, Intravenous, Once, Mohammed Kindle, MD .  orphenadrine (NORFLEX) injection 60 mg, 60 mg, Intramuscular, Once, Mohammed Kindle, MD .  orphenadrine (NORFLEX) injection 60 mg, 60 mg, Intramuscular, Once, Mohammed Kindle, MD .  potassium chloride 10 mEq in 100 mL IVPB, 10 mEq, Intravenous, Once, Lisa Roca, MD, Last Rate: 100 mL/hr at 03/11/17 1345, 10 mEq at 03/11/17 1345 .  triamcinolone acetonide (KENALOG-40) injection 40 mg, 40 mg, Other, Once, Mohammed Kindle, MD  Current Outpatient Medications:  .  aspirin EC 325 MG tablet, Take 325 mg by mouth daily., Disp: , Rfl:  .  carbidopa-levodopa (SINEMET CR) 50-200 MG per tablet, Take 1 tablet by mouth 3 (three) times daily. , Disp: , Rfl:  .  citalopram (CELEXA) 20 MG tablet, Take 20 mg by  mouth daily. , Disp: , Rfl:  .  finasteride (PROSCAR) 5 MG tablet, TAKE 1 TABLET BY MOUTH EVERY DAY, Disp: 90 tablet, Rfl: 0 .  fludrocortisone (FLORINEF) 0.1 MG tablet, Take 0.05 mg by mouth 2 (two) times daily., Disp: , Rfl:  .  lamoTRIgine (LAMICTAL) 150 MG tablet, Take 1 tablet by mouth twice daily, Disp: , Rfl:  .  levothyroxine (SYNTHROID, LEVOTHROID) 88 MCG tablet, Take 88 mcg by mouth daily before breakfast., Disp: , Rfl:  .  lisinopril (PRINIVIL,ZESTRIL) 10 MG tablet, Take 1 tablet (10 mg total) by mouth daily., Disp: 90 tablet, Rfl: 0 .  Multiple Vitamin (MULITIVITAMIN WITH MINERALS) TABS, Take 1 tablet by mouth daily., Disp: , Rfl:  .  pravastatin (PRAVACHOL) 20 MG tablet, Take 1 tablet (20 mg total) by mouth daily., Disp: 90 tablet, Rfl: 0 .  risperiDONE (RISPERDAL) 2 MG tablet, Take 2 mg by mouth at bedtime., Disp: , Rfl:  .  bisacodyl (DULCOLAX) 5 MG EC tablet, Take by mouth., Disp: , Rfl:  .  diclofenac sodium (VOLTAREN) 1 % GEL, Apply topically., Disp: , Rfl:  .  fluticasone (FLONASE) 50 MCG/ACT nasal spray, SHAKE LIQUID AND USE 2 SPRAYS IN EACH NOSTRIL DAILY, Disp: 16 g, Rfl: 2 .  levothyroxine (SYNTHROID, LEVOTHROID) 75 MCG tablet, TAKE 1 TABLET(75 MCG) BY MOUTH DAILY BEFORE BREAKFAST (Patient not taking: Reported on 03/11/2017), Disp: 30 tablet, Rfl: 0 .  loratadine (CLARITIN) 10 MG tablet, Take 1 tablet (10 mg total) by mouth daily. (Patient not taking: Reported on 09/08/2016), Disp: 30 tablet, Rfl: 0 .  Melatonin 5 MG TABS, Take 1 tablet by mouth at bedtime. Reported on 03/21/2015, Disp: , Rfl:  .  naproxen sodium (ANAPROX) 220 MG tablet, Take by mouth., Disp: , Rfl:  .  traMADol (ULTRAM) 50 MG tablet, Limit one half to one tab by mouth per day or twice per day if tolerated (Patient not taking: Reported on 03/11/2017), Disp: 40 tablet, Rfl: 0 .  traMADol (ULTRAM) 50 MG tablet, Take 1 tablet (50 mg total) by mouth every 8 (eight) hours as needed., Disp: 30 tablet, Rfl:  0   Social History: Social History   Tobacco Use  . Smoking status: Former Smoker    Last attempt to quit: 08/04/1961    Years since quitting: 55.6  . Smokeless tobacco: Never Used  Substance Use Topics  . Alcohol use: No    Comment: socially  . Drug use: No    Family Medical History: Family History  Problem Relation Age of Onset  . Prostate cancer Brother   . Kidney cancer Neg Hx   . Bladder Cancer Neg Hx     Physical Examination: Vitals:   03/11/17 1133 03/11/17 1314  BP: (!) 170/89 (!) 161/90  Pulse: 68 66  Resp: 16 16  Temp: 98.2 F (36.8 C)  SpO2: 95% 95%     General: Patient is well developed, well nourished, calm, collected, and in no apparent distress.  Psychiatric: Patient is non-anxious.  Head:  Pupils equal, round, and reactive to light.  ENT:  Oral mucosa appears well hydrated.  Neck:   Supple.  Full range of motion.  Respiratory: Patient is breathing without any difficulty.  Extremities: No edema.  Vascular: Palpable pulses in dorsal pedal vessels.  Skin:   On exposed skin, there are no abnormal skin lesions.  NEUROLOGICAL:  General: In no acute distress.   Awake, alert, oriented to person, place, and time.  Speech is clear and fluent.  Pupils equal round and reactive to light.  Facial tone is symmetric.  Tongue protrusion is midline.  There is no pronator drift.  Strength: Side Biceps Triceps Deltoid Interossei Grip Wrist Ext. Wrist Flex.  R 5 5 5 5 5 5 5   L 5 5 5 5 5 5 5    Side Iliopsoas Quads Hamstring PF DF EHL  R 5 5 5 5 5 5   L 5 5 5 5 5 5    Reflexes are 1+ and symmetric at the biceps, triceps, brachioradialis, patella and achilles.   Clonus is not present.  Toes are down-going.  Gait is untested due to imbalance.  Hoffman's is absent.  Imaging: CT head 03/11/17 IMPRESSION: Acute/ subacute intraparenchymal hemorrhage in the right parietal lobe, with small subarachnoid extension. No evidence of significant mass effect. This  may represent hemorrhagic infarct or de novo intraparenchymal hemorrhage.  Marked brain parenchymal atrophy and deep white matter microangiopathy.  These results were called by telephone at the time of interpretation on 03/11/2017 at 1:05 pm to Dr. Lisa Roca , who verbally acknowledged these results.   Electronically Signed   By: Fidela Salisbury M.D.   On: 03/11/2017 13:08  I have personally reviewed the images and agree with the above interpretation.  Assessment and Plan: Mr. Sharman is a pleasant 83 y.o. male with R parietal intraparenchymal hemorrhage on aspirin without any surrending edema. The differential for this includes hypertensive hemorrhage, hemorrhage conversion of stroke, hemorrhagic vascular lesion, or hemorrhagic neoplastic disease.  I would favor one of the first two.  I would recommend repeat head CT in 6 hours.  If this is stable, he can be discharged from the ER from a neurosurgical perspective.  I would hold his aspirin for 4 weeks, and also obtain MRI brain with and without contrast in 2-3 months.  I have recommended consideration of admission for functional decline and safety evaluation.  He and the ER will discuss.  Takita Riecke K. Izora Ribas MD, [place] [facility]

## 2017-03-11 NOTE — Progress Notes (Signed)
Discussed with Dr. Jimmye Norman patient ongoing hypertension and that per social worker patient will have to be admitted as observation to start process of placement to nursing facility.

## 2017-03-11 NOTE — ED Triage Notes (Signed)
Patient brought in via [facility] for complaints of headache for two days. Patient has history of parkinson's and is cared for by wife.  Patient wife reports given patient two alieve by mouth yesterday that did relieve headache however patient woke up this morning with complaints of headache again.  Wife reached out to Dr. Rebeca Allegra office about complaints and they recommended patient be seen in ED.

## 2017-03-11 NOTE — Clinical Social Work Note (Signed)
CSW consulted for possible placement of patient. Upon CSW reviewing patient's record and speaking with patient's nurse, patient's wife was initially not wanting to consider rehab placement but upon further discussion with the ED physician, she is now considering rehab. CSW spoke with patient's nurse and stated that patient has private insurance and will require a PT consult for recommendations. CSW spoke with Crystal at patient's insurance: [employer] and gave her a heads up regarding the potential need for rehab. Shortly thereafter, patient's nurse called CSW again to inform that she spoke with PT and PT will not be able to work with patient until the second CT has returned. The second CT is not to be done until late this evening. The nurse stated that the patient will remain over night. CSW will follow up in the morning regarding what the disposition will need to be and after PT is able to work with patient. Shela Leff MSW,LCSW [phone]

## 2017-03-12 ENCOUNTER — Other Ambulatory Visit: Payer: Self-pay | Admitting: *Deleted

## 2017-03-12 DIAGNOSIS — E876 Hypokalemia: Secondary | ICD-10-CM | POA: Diagnosis not present

## 2017-03-12 DIAGNOSIS — Z79899 Other long term (current) drug therapy: Secondary | ICD-10-CM | POA: Diagnosis not present

## 2017-03-12 DIAGNOSIS — G3183 Dementia with Lewy bodies: Secondary | ICD-10-CM | POA: Diagnosis not present

## 2017-03-12 DIAGNOSIS — Z87891 Personal history of nicotine dependence: Secondary | ICD-10-CM | POA: Diagnosis not present

## 2017-03-12 DIAGNOSIS — E785 Hyperlipidemia, unspecified: Secondary | ICD-10-CM | POA: Diagnosis not present

## 2017-03-12 DIAGNOSIS — R262 Difficulty in walking, not elsewhere classified: Secondary | ICD-10-CM | POA: Diagnosis not present

## 2017-03-12 DIAGNOSIS — R55 Syncope and collapse: Secondary | ICD-10-CM | POA: Diagnosis not present

## 2017-03-12 DIAGNOSIS — W19XXXA Unspecified fall, initial encounter: Secondary | ICD-10-CM | POA: Diagnosis not present

## 2017-03-12 DIAGNOSIS — I129 Hypertensive chronic kidney disease with stage 1 through stage 4 chronic kidney disease, or unspecified chronic kidney disease: Secondary | ICD-10-CM | POA: Diagnosis not present

## 2017-03-12 DIAGNOSIS — R41841 Cognitive communication deficit: Secondary | ICD-10-CM | POA: Diagnosis not present

## 2017-03-12 DIAGNOSIS — R4182 Altered mental status, unspecified: Secondary | ICD-10-CM | POA: Diagnosis not present

## 2017-03-12 DIAGNOSIS — M6281 Muscle weakness (generalized): Secondary | ICD-10-CM | POA: Diagnosis not present

## 2017-03-12 DIAGNOSIS — R4020 Unspecified coma: Secondary | ICD-10-CM | POA: Diagnosis not present

## 2017-03-12 DIAGNOSIS — Z950 Presence of cardiac pacemaker: Secondary | ICD-10-CM | POA: Diagnosis not present

## 2017-03-12 DIAGNOSIS — Z9181 History of falling: Secondary | ICD-10-CM | POA: Diagnosis not present

## 2017-03-12 DIAGNOSIS — Z7982 Long term (current) use of aspirin: Secondary | ICD-10-CM | POA: Diagnosis not present

## 2017-03-12 DIAGNOSIS — R499 Unspecified voice and resonance disorder: Secondary | ICD-10-CM | POA: Diagnosis not present

## 2017-03-12 DIAGNOSIS — R569 Unspecified convulsions: Secondary | ICD-10-CM | POA: Diagnosis not present

## 2017-03-12 DIAGNOSIS — E039 Hypothyroidism, unspecified: Secondary | ICD-10-CM | POA: Diagnosis not present

## 2017-03-12 DIAGNOSIS — Y999 Unspecified external cause status: Secondary | ICD-10-CM | POA: Diagnosis not present

## 2017-03-12 DIAGNOSIS — S0003XA Contusion of scalp, initial encounter: Secondary | ICD-10-CM | POA: Diagnosis not present

## 2017-03-12 DIAGNOSIS — Y9389 Activity, other specified: Secondary | ICD-10-CM | POA: Diagnosis not present

## 2017-03-12 DIAGNOSIS — S199XXA Unspecified injury of neck, initial encounter: Secondary | ICD-10-CM | POA: Diagnosis not present

## 2017-03-12 DIAGNOSIS — S0990XA Unspecified injury of head, initial encounter: Secondary | ICD-10-CM | POA: Diagnosis not present

## 2017-03-12 DIAGNOSIS — N189 Chronic kidney disease, unspecified: Secondary | ICD-10-CM | POA: Diagnosis not present

## 2017-03-12 DIAGNOSIS — G20 Parkinson's disease: Secondary | ICD-10-CM | POA: Diagnosis not present

## 2017-03-12 DIAGNOSIS — W01198A Fall on same level from slipping, tripping and stumbling with subsequent striking against other object, initial encounter: Secondary | ICD-10-CM | POA: Diagnosis not present

## 2017-03-12 DIAGNOSIS — I10 Essential (primary) hypertension: Secondary | ICD-10-CM | POA: Diagnosis not present

## 2017-03-12 DIAGNOSIS — R51 Headache: Secondary | ICD-10-CM | POA: Diagnosis not present

## 2017-03-12 DIAGNOSIS — F339 Major depressive disorder, recurrent, unspecified: Secondary | ICD-10-CM | POA: Diagnosis not present

## 2017-03-12 DIAGNOSIS — R531 Weakness: Secondary | ICD-10-CM | POA: Diagnosis not present

## 2017-03-12 DIAGNOSIS — R1310 Dysphagia, unspecified: Secondary | ICD-10-CM | POA: Diagnosis not present

## 2017-03-12 DIAGNOSIS — Y92129 Unspecified place in nursing home as the place of occurrence of the external cause: Secondary | ICD-10-CM | POA: Diagnosis not present

## 2017-03-12 DIAGNOSIS — F331 Major depressive disorder, recurrent, moderate: Secondary | ICD-10-CM | POA: Diagnosis not present

## 2017-03-12 DIAGNOSIS — R279 Unspecified lack of coordination: Secondary | ICD-10-CM | POA: Diagnosis not present

## 2017-03-12 DIAGNOSIS — I495 Sick sinus syndrome: Secondary | ICD-10-CM | POA: Diagnosis not present

## 2017-03-12 MED ORDER — PRAVASTATIN SODIUM 20 MG PO TABS
20.0000 mg | ORAL_TABLET | Freq: Every day | ORAL | Status: DC
Start: 2017-03-13 — End: 2017-03-12

## 2017-03-12 MED ORDER — ADULT MULTIVITAMIN W/MINERALS CH: 1.0000 | ORAL_TABLET | Freq: Every day | ORAL | Status: DC

## 2017-03-12 MED ORDER — CARBIDOPA-LEVODOPA ER 50-200 MG PO TBCR
1.0000 | EXTENDED_RELEASE_TABLET | Freq: Three times a day (TID) | ORAL | Status: DC
  Filled 2017-03-12 (×2): qty 1

## 2017-03-12 MED ORDER — LISINOPRIL 10 MG PO TABS: 10.0000 mg | ORAL_TABLET | Freq: Every day | ORAL | Status: DC

## 2017-03-12 MED ORDER — FINASTERIDE 5 MG PO TABS: 5.0000 mg | ORAL_TABLET | Freq: Every day | ORAL | Status: DC

## 2017-03-12 MED ORDER — CARBIDOPA-LEVODOPA ER 50-200 MG PO TBCR: 1.0000 | EXTENDED_RELEASE_TABLET | Freq: Three times a day (TID) | ORAL | 0 refills | Status: AC

## 2017-03-12 MED ORDER — CARBIDOPA-LEVODOPA ER 50-200 MG PO TBCR
1.0000 | EXTENDED_RELEASE_TABLET | Freq: Three times a day (TID) | ORAL | Status: DC
  Administered 2017-03-12: 1 via ORAL
  Filled 2017-03-12 (×2): qty 1

## 2017-03-12 MED ORDER — TRAMADOL HCL 50 MG PO TABS
50.0000 mg | ORAL_TABLET | Freq: Once | ORAL | Status: AC
  Administered 2017-03-12: 50 mg via ORAL
  Filled 2017-03-12: qty 1

## 2017-03-12 MED ORDER — LORATADINE 10 MG PO TABS: 10.0000 mg | ORAL_TABLET | Freq: Every day | ORAL | Status: DC

## 2017-03-12 MED ORDER — PRAVASTATIN SODIUM 20 MG PO TABS
20.0000 mg | ORAL_TABLET | Freq: Every day | ORAL | Status: DC
  Filled 2017-03-12: qty 1

## 2017-03-12 MED ORDER — LEVOTHYROXINE SODIUM 88 MCG PO TABS: 88.0000 ug | ORAL_TABLET | Freq: Every day | ORAL | Status: DC

## 2017-03-12 MED ORDER — TRAMADOL HCL 50 MG PO TABS: 50.0000 mg | ORAL_TABLET | Freq: Three times a day (TID) | ORAL | 0 refills | Status: DC | PRN

## 2017-03-12 MED ORDER — LABETALOL HCL 5 MG/ML IV SOLN
10.0000 mg | Freq: Once | INTRAVENOUS | Status: AC
  Administered 2017-03-12: 10 mg via INTRAVENOUS
  Filled 2017-03-12: qty 4

## 2017-03-12 MED ORDER — TRAMADOL HCL 50 MG PO TABS: 50.0000 mg | ORAL_TABLET | ORAL | Status: DC | PRN

## 2017-03-12 MED ORDER — CITALOPRAM HYDROBROMIDE 20 MG PO TABS: 20.0000 mg | ORAL_TABLET | Freq: Every day | ORAL | Status: DC

## 2017-03-12 MED ORDER — RISPERIDONE 2 MG PO TABS: 2.0000 mg | ORAL_TABLET | Freq: Every day | ORAL | 0 refills | Status: DC

## 2017-03-12 MED ORDER — LEVOTHYROXINE SODIUM 75 MCG PO TABS: 75.0000 ug | ORAL_TABLET | Freq: Every day | ORAL | 0 refills | Status: DC

## 2017-03-12 MED ORDER — RISPERIDONE 1 MG PO TABS: 2.0000 mg | ORAL_TABLET | Freq: Every day | ORAL | Status: DC

## 2017-03-12 MED ORDER — LISINOPRIL 10 MG PO TABS: 10.0000 mg | ORAL_TABLET | Freq: Every day | ORAL | 0 refills | Status: DC

## 2017-03-12 MED ORDER — PRAVASTATIN SODIUM 20 MG PO TABS
20.0000 mg | ORAL_TABLET | Freq: Every day | ORAL | Status: DC
Start: 2017-03-12 — End: 2017-03-12

## 2017-03-12 MED ORDER — PRAVASTATIN SODIUM 20 MG PO TABS: 20.0000 mg | ORAL_TABLET | Freq: Every day | ORAL | 0 refills | Status: DC

## 2017-03-12 MED ORDER — TRAMADOL HCL 50 MG PO TABS: 50.0000 mg | ORAL_TABLET | Freq: Three times a day (TID) | ORAL | Status: DC | PRN

## 2017-03-12 MED ORDER — LAMOTRIGINE 25 MG PO TABS: 150.0000 mg | ORAL_TABLET | Freq: Two times a day (BID) | ORAL | Status: DC

## 2017-03-12 MED ORDER — LAMOTRIGINE 150 MG PO TABS: 150.0000 mg | ORAL_TABLET | Freq: Two times a day (BID) | ORAL | 0 refills | Status: DC

## 2017-03-12 MED ORDER — FINASTERIDE 5 MG PO TABS: 5.0000 mg | ORAL_TABLET | Freq: Every day | ORAL | 0 refills | Status: AC

## 2017-03-12 MED ORDER — ASPIRIN EC 325 MG PO TBEC: 325.0000 mg | DELAYED_RELEASE_TABLET | Freq: Every day | ORAL | Status: DC

## 2017-03-12 MED ORDER — LEVOTHYROXINE SODIUM 50 MCG PO TABS: 75.0000 ug | ORAL_TABLET | Freq: Every day | ORAL | Status: DC

## 2017-03-12 MED ORDER — BISACODYL 5 MG PO TBEC: 5.0000 mg | DELAYED_RELEASE_TABLET | Freq: Every day | ORAL | 0 refills | Status: DC | PRN

## 2017-03-12 MED ORDER — CARBIDOPA-LEVODOPA ER 50-200 MG PO TBCR: 1.0000 | EXTENDED_RELEASE_TABLET | Freq: Three times a day (TID) | ORAL | Status: DC

## 2017-03-12 MED ORDER — CITALOPRAM HYDROBROMIDE 20 MG PO TABS: 20.0000 mg | ORAL_TABLET | Freq: Every day | ORAL | 0 refills | Status: DC

## 2017-03-12 NOTE — Progress Notes (Signed)
Called [facility] spoke to [place] bed offer accepted  Call report number [id number]   room e11  Patient to be transported by EMS ED secretary notified  Enis Slipper LCSW [phone]

## 2017-03-12 NOTE — Patient Outreach (Addendum)
[facility]) Care Management  03/12/2017  Lawrence DONATH Sr. Nov 24, 1931 [id number]   Phone call to patient's spouse to confirm that patient will be dischraging to the [facility] for rehab today. Per patient's spouse, she is feeling overwhelmed and stressed and would like assistance with coordinating community resources for patient once he discharges home.   Plan: This Education officer, museum will plan to follow up with patient and his spouse while in rehab.   Sheralyn Boatman [facility] Care Management [phone]

## 2017-03-12 NOTE — Progress Notes (Addendum)
LCSW met with patient and his wife. Completed assessment and Fl2 and obtained passr number. Provided wife HCPOA with SNF booklet and she has made a few selections of SNF's, information sent to [facility] but awaiting PT consult and will up load that as soon as its written to all SNF.   Will fax for  HTA  Information authorization and hopefully patient will be placed today in SNF. Spoke to [employer] LCSW [phone]

## 2017-03-12 NOTE — ED Notes (Signed)
RN called report to Dala Dock, RN'  Pts family reports she feels as though she could transport pt herself and was requesting to do so. [facility] RN reported she did not believe this to be the best plan of action with his history and plan of care. This RN will consult with CSW.

## 2017-03-12 NOTE — Progress Notes (Signed)
LCSW spoke to Chrystal at [facility] and she will review patient information and call back in a while once reviewed.  [employer] LCSW [phone]

## 2017-03-12 NOTE — Clinical Social Work Note (Addendum)
Clinical Social Work Assessment  Patient Details  Name: Lawrence Valdez. MRN: [id number] Date of Birth: 1931/04/19  Date of referral:  03/12/17               Reason for consult:  Facility Placement                Permission sought to share information with:  Family Supports, Customer service manager Permission granted to share information::  Yes, Verbal Permission Granted  Name::     [name] [phone] Son Norma Ignasiak [phone]  Agency::  All facilities  Relationship::     Contact Information:     Housing/Transportation Living arrangements for the past 2 months:  Single Family Home Source of Information:  Patient Patient Interpreter Needed:  None Criminal Activity/Legal Involvement Pertinent to Current Situation/Hospitalization:  No - Comment as needed Significant Relationships:  Adult Children, Spouse Lives with:  Spouse, Pets Do you feel safe going back to the place where you live?    Need for family participation in patient care:  Yes (Comment)  Care giving concerns: Wife would like him to placed in SNF for STR called and left message   Social Worker assessment / plan:  LCSW introduced myself to patient and he gave verbal consent to speak to his wife and SNF facilities and his son. He is oriented x4 and is married for 15 years and has great family support. He has H[employer] insurance and is agreeable to STR at [facility]- Awaiting PT consult. Patient uses a walker and has wheelchair at home and has home health come in every other day to assist with showering, his wife assists him with dressing and toileting. He is continent but does wear diapers at night. He reports he does not have the best appetite lately and feels weakCharles L Cindric Valdez. is a 83 y.o. male who presents with the chief complaint of unsteadiness, not acting himself.  He was unable to stand today.  He lives with his wife, who is present today.  He began having seizures in June 2018, and has been going downhill  functionally since June.  His wife is doing most of his ADLs at this point.  He came to the ER today with symptoms of a headache for the past week, as well as some fatigue. He denies nausea, vomiting, or signs or symptoms of seizure.  Nothing provoked his headache - no trauma.Marland Kitchen Awaiting PT consult  Employment status:  Retired Forensic scientist:  Agricultural engineer) PT Recommendations:  [place] / Referral to community resources:     Patient/Family's Response to care: patient understands STR will help him with his strength  Patient/Family's Understanding of and Emotional Response to Diagnosis, Current Treatment, and Prognosis:  Good understanding  Emotional Assessment Appearance:  Appears stated age Attitude/Demeanor/Rapport:  (Good sense of humour pleasant) Affect (typically observed):  Accepting, Calm Orientation:  Oriented to Self, Oriented to Place, Oriented to  Time, Oriented to Situation Alcohol / Substance use:  Not Applicable Psych involvement (Current and /or in the community):  No (Comment)  Discharge Needs  Concerns to be addressed:  No discharge needs identified Readmission within the last 30 days:  No Current discharge risk:  None Barriers to Discharge:  No Barriers Identified   Joana Reamer, LCSW 03/12/2017, 9:04 AM

## 2017-03-12 NOTE — Progress Notes (Signed)
LCSW spoke to [facility] and they have accepted bed offer at [facility].  Chantavia Bazzle LCSW

## 2017-03-12 NOTE — Evaluation (Signed)
Physical Therapy Evaluation Patient Details Name: Lawrence CAMPS Sr. MRN: [id number] DOB: March 21, 1932 Today's Date: 03/12/2017   History of Present Illness  presented to ER secondary to worsening headache, generalized fatigue; noted with intraparenchymal bleed with subarachnoid extension.  Repeat CT (6 hours) unchanged, stable.  Clinical Impression  Upon evaluation, patient alert and oriented to basic information; mild confusion to date, time.  Bilat UE/LE generally weak and deconditioned; posterior lean/weight shift with all sitting/standing activities.  Currently requiring min/mod assist for all sit/stand, basic transfers and gait attempts (30') with RW.  Short, shuffling and very festinating gait performance with multiple freezing episodes.  L lateral lean in standing requiring constant min/mod assist from therapist for correction to midline.  Very high risk for recurrent falls. Would benefit from skilled PT to address above deficits and promote optimal return to PLOF; recommend transition to STR upon discharge from acute hospitalization.     Follow Up Recommendations SNF    Equipment Recommendations       Recommendations for Other Services       Precautions / Restrictions Precautions Precautions: Fall Restrictions Weight Bearing Restrictions: No      Mobility  Bed Mobility Overal bed mobility: Needs Assistance Bed Mobility: Supine to Sit     Supine to sit: Min assist        Transfers Overall transfer level: Needs assistance Equipment used: Rolling walker (2 wheeled) Transfers: Sit to/from Stand Sit to Stand: Min assist;Mod assist         General transfer comment: assist for lift off and initial stabilization  Ambulation/Gait Ambulation/Gait assistance: Min assist;Mod assist Ambulation Distance (Feet): 30 Feet Assistive device: Rolling walker (2 wheeled)       General Gait Details: very short, shuffling steps with forward flexed posture; very festinating  wtih multiple freezing episodes.  Poor balance, very high risk for recurrent falls  Stairs            Wheelchair Mobility    Modified Rankin (Stroke Patients Only)       Balance Overall balance assessment: Needs assistance Sitting-balance support: No upper extremity supported;Feet supported Sitting balance-Leahy Scale: Fair Sitting balance - Comments: lists posteriorally with fatigue or divided attention, min assist to correct Postural control: Posterior lean Standing balance support: Bilateral upper extremity supported Standing balance-Leahy Scale: Poor                               Pertinent Vitals/Pain Pain Assessment: No/denies pain    Home Living Family/patient expects to be discharged to:: Private residence Living Arrangements: Spouse/significant other Available Help at Discharge: Family Type of Home: House Home Access: Ramped entrance     Home Layout: One level        Prior Function Level of Independence: Needs assistance         Comments: Sup for limited, household mobility with RW; assist as needed for ADLs.  Utilizes manual WC as primary mobility when wife not home.  Does endorse multiple fall history, progressive weakness in recent weeks     Hand Dominance        Extremity/Trunk Assessment   Upper Extremity Assessment Upper Extremity Assessment: Overall WFL for tasks assessed    Lower Extremity Assessment Lower Extremity Assessment: Generalized weakness(grossly 4-/5 throughout)    Cervical / Trunk Assessment Cervical / Trunk Assessment: (decreased postural extension throughout cervical, thoracic and lumbar spine; very forward head)  Communication   Communication: (speech slightly mumbled, dysphonic)  Cognition Arousal/Alertness: Awake/alert Behavior During Therapy: WFL for tasks assessed/performed Overall Cognitive Status: History of cognitive impairments - at baseline                                  General Comments: mild confusion to time, date      General Comments      Exercises Other Exercises Other Exercises: Sit/stand x3 with RW, min/mod assist Other Exercises: Forward/backward stepping, min/mod assist-very short, shuffling steps; 3-point, gait pattern   Assessment/Plan    PT Assessment Patient needs continued PT services  PT Problem List Decreased strength;Decreased range of motion;Decreased activity tolerance;Decreased balance;Decreased mobility;Decreased coordination;Decreased cognition;Decreased knowledge of use of DME;Decreased safety awareness;Decreased knowledge of precautions       PT Treatment Interventions DME instruction;Gait training;Functional mobility training;Therapeutic activities;Therapeutic exercise;Balance training;Neuromuscular re-education;Stair training;Cognitive remediation;Patient/family education    PT Goals (Current goals can be found in the Care Plan section)  Acute Rehab PT Goals Patient Stated Goal: to go to rehab to get stronger PT Goal Formulation: With patient/family Time For Goal Achievement: 03/26/17 Potential to Achieve Goals: Good    Frequency Min 2X/week   Barriers to discharge        Co-evaluation               AM-PAC PT "6 Clicks" Daily Activity  Outcome Measure Difficulty turning over in bed (including adjusting bedclothes, sheets and blankets)?: Unable Difficulty moving from lying on back to sitting on the side of the bed? : Unable Difficulty sitting down on and standing up from a chair with arms (e.g., wheelchair, bedside commode, etc,.)?: Unable Help needed moving to and from a bed to chair (including a wheelchair)?: A Lot Help needed walking in hospital room?: A Lot Help needed climbing 3-5 steps with a railing? : A Lot 6 Click Score: 9    End of Session Equipment Utilized During Treatment: Gait belt Activity Tolerance: Patient tolerated treatment well Patient left: in bed;with call bell/phone within  reach;with family/visitor present Nurse Communication: Mobility status PT Visit Diagnosis: Muscle weakness (generalized) (M62.81);Difficulty in walking, not elsewhere classified (R26.2);History of falling (Z91.81)    Time: 9798-9211 PT Time Calculation (min) (ACUTE ONLY): 31 min   Charges:   PT Evaluation $PT Eval Moderate Complexity: 1 Mod PT Treatments $Therapeutic Activity: 8-22 mins   PT G Codes:   PT G-Codes **NOT FOR INPATIENT CLASS** Functional Assessment Tool Used: AM-PAC 6 Clicks Basic Mobility Functional Limitation: Mobility: Walking and moving around Mobility: Walking and Moving Around Current Status (H4174): At least 40 percent but less than 60 percent impaired, limited or restricted Mobility: Walking and Moving Around Goal Status [phone]): At least 1 percent but less than 20 percent impaired, limited or restricted   Alyene Predmore H. Owens Shark, PT, DPT, NCS 03/12/17, 10:53 AM [phone]

## 2017-03-12 NOTE — ED Notes (Signed)
Pt assisted into car by this RN and wife. PT was not easy to transfer but with two assists it was manageable and wife reports feeling safe to transfer pt. EMS offered again and refused again.

## 2017-03-12 NOTE — ED Notes (Signed)
Pt resting quietly in bed. Lights dimmed per request. Call bell at bedside.

## 2017-03-12 NOTE — ED Notes (Addendum)
RN called [facility] back to update that pt does not meet criteria for EMS transport. RN requested [facility] provide alternate form of transport. RN reports to this RN that the bed has not been approved yet by insurance and that the pt could not be transferred until that occurred. This RN left a return number and [facility] reports they will call this RN back as soon as they have approval.   Family updated.

## 2017-03-12 NOTE — ED Notes (Signed)
Pt found to be sliding down in the bed. Assisted back up into the bed by this RN and Caryl Pina, Therapist, sports. Pt brief changed. Social workers at bedside. Breakfast set up at beside and patient encouraged to eat breakfast.

## 2017-03-12 NOTE — ED Notes (Signed)
PT at bedside for assessment.

## 2017-03-12 NOTE — ED Notes (Signed)
Pt changed and IV removed. PT updated and informed he has a room and with be discharged as soon as wife returns to ED. Pt in NAD at this time.

## 2017-03-12 NOTE — NC FL2 (Signed)
[place] LEVEL OF CARE SCREENING TOOL     IDENTIFICATION  Patient Name: Lawrence MARKOS Sr. Birthdate: 1932-01-10 Sex: male Admission Date (Current Location): 03/11/2017  [place] and [place] Number:  Engineering geologist and Address:  [facility], [address] [place], [place] [address]      Provider Number: [id number]  Attending Physician Name and Address:  No att. providers found  Relative Name and Phone Number:   HCPOA - Nehemiah Mcfarren    Current Level of Care: Hospital Recommended Level of Care: [place] Prior Approval Number:    Date Approved/Denied:   PASRR Number:  [id number]  Discharge Plan: SNF    Current Diagnoses: Patient Active Problem List   Diagnosis Date Noted  . At risk for falls 09/29/2016  . 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 Seizure disorder ([place]) 05/05/2016  . Chronic bilateral low back pain without sciatica 05/05/2016  . Hx of thrombocytopenia 11/07/2015  . Normocytic anemia 07/17/2015  . History of sick sinus syndrome 04/16/2015  . Chest wall pain 11/29/2014  . Hypertension 10/11/2014  . Hyperlipidemia 10/11/2014  . Idiopathic Parkinson's disease ([place]) 05/02/2014  . Leg pain, right 05/02/2014  . Back pain, chronic 12/28/2013  . Clinical depression 08/29/2013  . Has a tremor 08/29/2013  . Mechanical and motor problems with internal organs 08/29/2013  . Disordered sleep 08/29/2013  . Amnesia 08/29/2013  . Fatigue 08/29/2013  . Fall 08/29/2013  . Dizziness 08/29/2013  . Difficulty in walking 08/29/2013  . Recurrent major depressive disorder, in partial remission ([place]) 08/29/2013  . Adult hypothyroidism 06/16/2013  . Acquired trigger finger 06/16/2013  . Arrhythmia, sinus node 06/16/2013  . HLD (hyperlipidemia) 06/16/2013  . Benign essential HTN 06/16/2013  . Pure hypercholesterolemia 06/16/2013    Orientation RESPIRATION BLADDER Height & Weight      Self, Time, Situation, Place  Normal Continent Weight: 170 lb (77.1 kg) Height:  5\' 6"  (167.6 cm)  BEHAVIORAL SYMPTOMS/MOOD NEUROLOGICAL BOWEL NUTRITION STATUS      Continent Diet(Normal)  AMBULATORY STATUS COMMUNICATION OF NEEDS Skin   Limited Assist Verbally Normal                       Personal Care Assistance Level of Assistance  Bathing, Dressing, Total care, Feeding Bathing Assistance: Limited assistance Feeding assistance: Independent Dressing Assistance: Limited assistance Total Care Assistance: Limited assistance   Functional Limitations Info  Sight, Hearing, Speech Sight Info: Adequate Hearing Info: Adequate Speech Info: Adequate    SPECIAL CARE FACTORS FREQUENCY  PT (By licensed PT), OT (By licensed OT)     PT Frequency: x5 OT Frequency: x5            Contractures Contractures Info: Not present    Additional Factors Info  Code Status, Allergies Code Status Info: Full Allergies Info: Tape           Current Medications (03/12/2017):  This is the current hospital active medication list Current Facility-Administered Medications  Medication Dose Route Frequency Provider Last Rate Last Dose  . bupivacaine (PF) (MARCAINE) 0.25 % injection 30 mL  30 mL Other Once Mohammed Kindle, MD      . bupivacaine (PF) (MARCAINE) 0.25 % injection 30 mL  30 mL Other Once Mohammed Kindle, MD      . ceFAZolin (ANCEF) IVPB 1 g/50 mL premix  1 g Intravenous Once Mohammed Kindle, MD      . ceFAZolin (ANCEF) IVPB 1  g/50 mL premix  1 g Intravenous Once Mohammed Kindle, MD      . fentaNYL (SUBLIMAZE) injection 100 mcg  100 mcg Intravenous Once Mohammed Kindle, MD      . lactated ringers infusion 1,000 mL  1,000 mL Intravenous Continuous Mohammed Kindle, MD      . lactated ringers infusion 1,000 mL  1,000 mL Intravenous Continuous Mohammed Kindle, MD      . lidocaine (PF) (XYLOCAINE) 1 % injection 10 mL  10 mL Subcutaneous Once Mohammed Kindle, MD      . midazolam (VERSED) 5 MG/5ML  injection 5 mg  5 mg Intravenous Once Mohammed Kindle, MD      . orphenadrine (NORFLEX) injection 60 mg  60 mg Intramuscular Once Mohammed Kindle, MD      . orphenadrine (NORFLEX) injection 60 mg  60 mg Intramuscular Once Mohammed Kindle, MD      . traMADol Veatrice Bourbon) tablet 50 mg  50 mg Oral QHS Earleen Newport, MD   50 mg at 03/11/17 2315  . triamcinolone acetonide (KENALOG-40) injection 40 mg  40 mg Other Once Mohammed Kindle, MD       Current Outpatient Medications  Medication Sig Dispense Refill  . aspirin EC 325 MG tablet Take 325 mg by mouth daily.    . carbidopa-levodopa (SINEMET CR) 50-200 MG per tablet Take 1 tablet by mouth 3 (three) times daily.     . citalopram (CELEXA) 20 MG tablet Take 20 mg by mouth daily.     . finasteride (PROSCAR) 5 MG tablet TAKE 1 TABLET BY MOUTH EVERY DAY 90 tablet 0  . fludrocortisone (FLORINEF) 0.1 MG tablet Take 0.05 mg by mouth 2 (two) times daily.    Marland Kitchen lamoTRIgine (LAMICTAL) 150 MG tablet Take 1 tablet by mouth twice daily    . levothyroxine (SYNTHROID, LEVOTHROID) 88 MCG tablet Take 88 mcg by mouth daily before breakfast.    . lisinopril (PRINIVIL,ZESTRIL) 10 MG tablet Take 1 tablet (10 mg total) by mouth daily. 90 tablet 0  . Multiple Vitamin (MULITIVITAMIN WITH MINERALS) TABS Take 1 tablet by mouth daily.    . pravastatin (PRAVACHOL) 20 MG tablet Take 1 tablet (20 mg total) by mouth daily. 90 tablet 0  . risperiDONE (RISPERDAL) 2 MG tablet Take 2 mg by mouth at bedtime.    . bisacodyl (DULCOLAX) 5 MG EC tablet Take by mouth.    . diclofenac sodium (VOLTAREN) 1 % GEL Apply topically.    . fluticasone (FLONASE) 50 MCG/ACT nasal spray SHAKE LIQUID AND USE 2 SPRAYS IN EACH NOSTRIL DAILY 16 g 2  . levothyroxine (SYNTHROID, LEVOTHROID) 75 MCG tablet TAKE 1 TABLET(75 MCG) BY MOUTH DAILY BEFORE BREAKFAST (Patient not taking: Reported on 03/11/2017) 30 tablet 0  . loratadine (CLARITIN) 10 MG tablet Take 1 tablet (10 mg total) by mouth daily. (Patient not  taking: Reported on 09/08/2016) 30 tablet 0  . Melatonin 5 MG TABS Take 1 tablet by mouth at bedtime. Reported on 03/21/2015    . naproxen sodium (ANAPROX) 220 MG tablet Take by mouth.    . traMADol (ULTRAM) 50 MG tablet Limit one half to one tab by mouth per day or twice per day if tolerated (Patient not taking: Reported on 03/11/2017) 40 tablet 0  . traMADol (ULTRAM) 50 MG tablet Take 1 tablet (50 mg total) by mouth every 8 (eight) hours as needed. 30 tablet 0     Discharge Medications: Please see discharge summary for a list of discharge medications.  Relevant Imaging Results:  Relevant Lab Results:   Additional Information [id number]  Joana Reamer, [place]

## 2017-03-12 NOTE — ED Notes (Signed)
Pt up to bathroom with walker and 1 person assistance. Pt had BM. Pt given bed bath, linens changed. Pt in clean gown and socks. Wife at bedside.

## 2017-03-12 NOTE — ED Notes (Signed)
Pt wife at bedside. Assisting patient to eat. Pt brief changed.

## 2017-03-12 NOTE — ED Notes (Signed)
Report to Shannon, RN  

## 2017-03-12 NOTE — Progress Notes (Signed)
LCSW received call from [place] from [place] Number [phone] . Called and spoke to [place] and she is the DON at the [facility] and she requested scripts for the patient and Fl2 signed. Spoke to EDP and EDS and they will put together patient d/c package.  Patient to be transported by his wife to facility.  [employer] LCSW [phone]

## 2017-03-12 NOTE — ED Notes (Signed)
Spoke with EDP regarding elevated BP. Orders placed by EDP.

## 2017-03-12 NOTE — Progress Notes (Signed)
LCSW called [employer] all documents were faxed  1 hour ago awaiting authorization number. Spoke to [employer] at [place] and patient cant be transported until authorization number in. We do not know how long that will take.  EDRN and ED secretary advised.  [employer] LCSW [phone]

## 2017-03-12 NOTE — ED Notes (Signed)
Pt ate half of biscuit, grapes and 4 oz apple juice.

## 2017-03-12 NOTE — ED Notes (Addendum)
Family out to bedside; inquiring about medication for headache. Will ask EDP.

## 2017-03-12 NOTE — ED Notes (Signed)
RN had called report earlier in the day and RN had verbalized no need to call report again that staff was expecting patient. CSW had been informed that bed was approved and number had been assigned.

## 2017-03-12 NOTE — ED Notes (Signed)
Pt offered and given his breakfast, states he does not want.

## 2017-03-13 DIAGNOSIS — G20 Parkinson's disease: Secondary | ICD-10-CM | POA: Diagnosis not present

## 2017-03-13 DIAGNOSIS — F339 Major depressive disorder, recurrent, unspecified: Secondary | ICD-10-CM | POA: Diagnosis not present

## 2017-03-13 DIAGNOSIS — I495 Sick sinus syndrome: Secondary | ICD-10-CM | POA: Diagnosis not present

## 2017-03-17 ENCOUNTER — Other Ambulatory Visit: Payer: PPO | Admitting: Orthotics

## 2017-03-24 ENCOUNTER — Ambulatory Visit: Payer: Self-pay | Admitting: *Deleted

## 2017-03-26 ENCOUNTER — Other Ambulatory Visit: Payer: Self-pay | Admitting: *Deleted

## 2017-03-26 NOTE — Patient Outreach (Signed)
[facility]) Care Management  [place]  03/26/2017   Lawrence KUN Sr. 1931-05-04 [id number]  Subjective: Patient is a 84 year old male currently in rehab following a ED visit with symptoms of a headache and fatigue. Patient's wife of 2 years (been together 18 years) at the bedside. Patient very quiet during the entire visit, however was able to  state that he did not like the rehab and wanted to return home as soon as possible. "This place feels like a prison"Per  Patient's wife, "he had a bad morning,  he did not participate in PT today. Patient's wife states that she is his primary caregiver. She describes minimal outside support.  She is working on getting her bathroom more accessible. Her son in law is wiling to assist with putting  in a new floor and handicapped shower. She further reports that patient served in the [place] war and she is asking for assistance with applying for Aid and Attendance to assist with his care needs through [facility].  Patient's spouse, also states that she cannot afford the co-pay for a hospital bed and is requesting community resources to assist with getting a new hospital bed.   Objective:   Encounter Medications:  Outpatient Encounter Medications as of 03/26/2017  Medication Sig Note  . bisacodyl (DULCOLAX) 5 MG EC tablet Take 1 tablet (5 mg total) by mouth daily as needed for moderate constipation.   . carbidopa-levodopa (SINEMET CR) 50-200 MG tablet Take 1 tablet by mouth 3 (three) times daily.   . citalopram (CELEXA) 20 MG tablet Take 1 tablet (20 mg total) by mouth daily.   . diclofenac sodium (VOLTAREN) 1 % GEL Apply topically. 07/06/2016: PRN  . finasteride (PROSCAR) 5 MG tablet Take 1 tablet (5 mg total) by mouth daily.   . fludrocortisone (FLORINEF) 0.1 MG tablet Take 0.05 mg by mouth 2 (two) times daily.   . fluticasone (FLONASE) 50 MCG/ACT nasal spray SHAKE LIQUID AND USE 2 SPRAYS IN EACH NOSTRIL DAILY 07/06/2016: PRN   . lamoTRIgine (LAMICTAL) 150 MG tablet Take 1 tablet (150 mg total) by mouth 2 (two) times daily.   Marland Kitchen levothyroxine (SYNTHROID, LEVOTHROID) 75 MCG tablet Take 1 tablet (75 mcg total) by mouth daily before breakfast.   . levothyroxine (SYNTHROID, LEVOTHROID) 88 MCG tablet Take 88 mcg by mouth daily before breakfast.   . lisinopril (PRINIVIL,ZESTRIL) 10 MG tablet Take 1 tablet (10 mg total) by mouth daily.   . Multiple Vitamin (MULITIVITAMIN WITH MINERALS) TABS Take 1 tablet by mouth daily.   . pravastatin (PRAVACHOL) 20 MG tablet Take 1 tablet (20 mg total) by mouth daily.   . risperiDONE (RISPERDAL) 2 MG tablet Take 1 tablet (2 mg total) by mouth at bedtime.   . traMADol (ULTRAM) 50 MG tablet Limit one half to one tab by mouth per day or twice per day if tolerated 07/06/2016: PRN  . traMADol (ULTRAM) 50 MG tablet Take 1 tablet (50 mg total) by mouth 3 (three) times daily as needed for moderate pain.   Marland Kitchen aspirin EC 325 MG tablet Take 325 mg by mouth daily.   Marland Kitchen loratadine (CLARITIN) 10 MG tablet Take 1 tablet (10 mg total) by mouth daily. (Patient not taking: Reported on 09/08/2016)   . Melatonin 5 MG TABS Take 1 tablet by mouth at bedtime. Reported on 03/21/2015 07/06/2016: PRN  . naproxen sodium (ANAPROX) 220 MG tablet Take by mouth.    Facility-Administered Encounter Medications as of 03/26/2017  Medication  . bupivacaine (PF) (MARCAINE) 0.25 % injection 30 mL  . bupivacaine (PF) (MARCAINE) 0.25 % injection 30 mL  . ceFAZolin (ANCEF) IVPB 1 g/50 mL premix  . ceFAZolin (ANCEF) IVPB 1 g/50 mL premix  . fentaNYL (SUBLIMAZE) injection 100 mcg  . lactated ringers infusion 1,000 mL  . lactated ringers infusion 1,000 mL  . lidocaine (PF) (XYLOCAINE) 1 % injection 10 mL  . midazolam (VERSED) 5 MG/5ML injection 5 mg  . orphenadrine (NORFLEX) injection 60 mg  . orphenadrine (NORFLEX) injection 60 mg  . triamcinolone acetonide (KENALOG-40) injection 40 mg    Functional Status:  In your present  state of health, do you have any difficulty performing the following activities: 02/22/2017 09/08/2016  Hearing? N N  Vision? N N  Difficulty concentrating or making decisions? Tempie Donning  Walking or climbing stairs? Y Y  Dressing or bathing? Y Y  Doing errands, shopping? Y -  Some recent data might be hidden    Fall/Depression Screening: Fall Risk  03/26/2017 02/22/2017 03/21/2015  Falls in the past year? Yes Yes (No Data)  Comment - - no falls since last visit  Number falls in past yr: 2 or more 2 or more -  Injury with Fall? Yes No -  Risk Factor Category  - - -  Risk for fall due to : History of fall(s);Impaired balance/gait;Impaired mobility History of fall(s) -  Risk for fall due to: Comment - - -  Follow up Education provided - -   PHQ 2/9 Scores 03/26/2017 03/21/2015 02/27/2015 02/12/2015 01/09/2015 12/19/2014 11/29/2014  PHQ - 2 Score 2 0 - 0 - 0 0  PHQ- 9 Score 7 - - - - - -  Exception Documentation - Patient refusal (No Data) Patient refusal Patient refusal - -    Assessment: Patient remained quiet during the visit and only spoke to discuss his desire to return home as soon as possible. Appeared confused as to the reason that he was admitted to rehab. Per patient, he linked his rehab stay to the time he was admitted to a mental health facility. Patient's wife made continued efforts to explain the reason for his current admit to rehab. Patient's spouse very attentive to patient's needs.   Plan: This social worker to continue to follow patient's progress in rehab and will assist with discharge planning.    Sheralyn Boatman [facility] Care Management [phone]

## 2017-03-27 ENCOUNTER — Encounter: Payer: Self-pay | Admitting: *Deleted

## 2017-03-31 DIAGNOSIS — G20 Parkinson's disease: Secondary | ICD-10-CM | POA: Diagnosis not present

## 2017-03-31 DIAGNOSIS — R4182 Altered mental status, unspecified: Secondary | ICD-10-CM | POA: Diagnosis not present

## 2017-04-04 ENCOUNTER — Other Ambulatory Visit: Payer: Self-pay

## 2017-04-04 ENCOUNTER — Encounter: Payer: Self-pay | Admitting: Emergency Medicine

## 2017-04-04 ENCOUNTER — Emergency Department: Payer: PPO

## 2017-04-04 ENCOUNTER — Emergency Department
Admission: EM | Admit: 2017-04-04 | Discharge: 2017-04-04 | Disposition: A | Discharge status: Home / Self Care | Payer: PPO | Attending: Emergency Medicine | Admitting: Emergency Medicine

## 2017-04-04 DIAGNOSIS — W01198A Fall on same level from slipping, tripping and stumbling with subsequent striking against other object, initial encounter: Secondary | ICD-10-CM | POA: Insufficient documentation

## 2017-04-04 DIAGNOSIS — Y9389 Activity, other specified: Secondary | ICD-10-CM | POA: Insufficient documentation

## 2017-04-04 DIAGNOSIS — Z7982 Long term (current) use of aspirin: Secondary | ICD-10-CM | POA: Diagnosis not present

## 2017-04-04 DIAGNOSIS — G3183 Dementia with Lewy bodies: Secondary | ICD-10-CM | POA: Diagnosis not present

## 2017-04-04 DIAGNOSIS — Y999 Unspecified external cause status: Secondary | ICD-10-CM | POA: Insufficient documentation

## 2017-04-04 DIAGNOSIS — S0003XA Contusion of scalp, initial encounter: Secondary | ICD-10-CM | POA: Diagnosis not present

## 2017-04-04 DIAGNOSIS — R4182 Altered mental status, unspecified: Secondary | ICD-10-CM | POA: Diagnosis not present

## 2017-04-04 DIAGNOSIS — Z79899 Other long term (current) drug therapy: Secondary | ICD-10-CM | POA: Insufficient documentation

## 2017-04-04 DIAGNOSIS — N189 Chronic kidney disease, unspecified: Secondary | ICD-10-CM | POA: Diagnosis not present

## 2017-04-04 DIAGNOSIS — I129 Hypertensive chronic kidney disease with stage 1 through stage 4 chronic kidney disease, or unspecified chronic kidney disease: Secondary | ICD-10-CM | POA: Insufficient documentation

## 2017-04-04 DIAGNOSIS — E039 Hypothyroidism, unspecified: Secondary | ICD-10-CM | POA: Insufficient documentation

## 2017-04-04 DIAGNOSIS — S199XXA Unspecified injury of neck, initial encounter: Secondary | ICD-10-CM | POA: Diagnosis not present

## 2017-04-04 DIAGNOSIS — Z950 Presence of cardiac pacemaker: Secondary | ICD-10-CM | POA: Insufficient documentation

## 2017-04-04 DIAGNOSIS — Z87891 Personal history of nicotine dependence: Secondary | ICD-10-CM | POA: Diagnosis not present

## 2017-04-04 DIAGNOSIS — W19XXXA Unspecified fall, initial encounter: Secondary | ICD-10-CM

## 2017-04-04 DIAGNOSIS — Y92129 Unspecified place in nursing home as the place of occurrence of the external cause: Secondary | ICD-10-CM | POA: Insufficient documentation

## 2017-04-04 LAB — CBC WITH DIFFERENTIAL/PLATELET
BASOS PCT: 1 %
Basophils Absolute: 0 10*3/uL (ref 0–0.1)
Eosinophils Absolute: 0.1 10*3/uL (ref 0–0.7)
Eosinophils Relative: 1 %
HEMATOCRIT: 39.7 % — AB (ref 40.0–52.0)
HEMOGLOBIN: 13.3 g/dL (ref 13.0–18.0)
LYMPHS ABS: 0.8 10*3/uL — AB (ref 1.0–3.6)
Lymphocytes Relative: 17 %
MCH: 31.3 pg (ref 26.0–34.0)
MCHC: 33.6 g/dL (ref 32.0–36.0)
MCV: 93.1 fL (ref 80.0–100.0)
MONO ABS: 0.3 10*3/uL (ref 0.2–1.0)
MONOS PCT: 7 %
NEUTROS ABS: 3.5 10*3/uL (ref 1.4–6.5)
NEUTROS PCT: 74 %
Platelets: 137 10*3/uL — ABNORMAL LOW (ref 150–440)
RBC: 4.26 MIL/uL — ABNORMAL LOW (ref 4.40–5.90)
RDW: 13.5 % (ref 11.5–14.5)
WBC: 4.7 10*3/uL (ref 3.8–10.6)

## 2017-04-04 LAB — COMPREHENSIVE METABOLIC PANEL
ALBUMIN: 3.8 g/dL (ref 3.5–5.0)
ALK PHOS: 48 U/L (ref 38–126)
ALT: 10 U/L — ABNORMAL LOW (ref 17–63)
ANION GAP: 8 (ref 5–15)
AST: 27 U/L (ref 15–41)
BILIRUBIN TOTAL: 0.8 mg/dL (ref 0.3–1.2)
BUN: 18 mg/dL (ref 6–20)
CALCIUM: 9.4 mg/dL (ref 8.9–10.3)
CO2: 31 mmol/L (ref 22–32)
Chloride: 101 mmol/L (ref 101–111)
Creatinine, Ser: 1.01 mg/dL (ref 0.61–1.24)
GLUCOSE: 121 mg/dL — AB (ref 65–99)
POTASSIUM: 3.6 mmol/L (ref 3.5–5.1)
Sodium: 140 mmol/L (ref 135–145)
TOTAL PROTEIN: 6.9 g/dL (ref 6.5–8.1)

## 2017-04-04 LAB — URINALYSIS, COMPLETE (UACMP) WITH MICROSCOPIC
BILIRUBIN URINE: NEGATIVE
Bacteria, UA: NONE SEEN
GLUCOSE, UA: NEGATIVE mg/dL
Hgb urine dipstick: NEGATIVE
KETONES UR: 5 mg/dL — AB
LEUKOCYTES UA: NEGATIVE
NITRITE: NEGATIVE
Protein, ur: NEGATIVE mg/dL
SPECIFIC GRAVITY, URINE: 1.014 (ref 1.005–1.030)
pH: 6 (ref 5.0–8.0)

## 2017-04-04 LAB — TROPONIN I: Troponin I: <0.03 ng/mL (ref <0.03)

## 2017-04-04 NOTE — ED Notes (Signed)
Called [facility] for transport to [facility]   [phone]

## 2017-04-04 NOTE — ED Notes (Signed)
Patient transported back to [facility] via [facility]. Wife signed for discharge papers.

## 2017-04-04 NOTE — ED Provider Notes (Signed)
[facility] Emergency Department Provider Note  ____________________________________________  Time seen: Approximately 11:55 AM  I have reviewed the triage vital signs and the nursing notes.   HISTORY  Chief Complaint Fall and Altered Mental Status  Level 5 caveat:  Portions of the history and physical were unable to be obtained due to dementia and non verbal   HPI Lawrence SCHORR Sr. is a 84 y.o. male with a history of advanced Parkinson's dementia, seizure disorder, hypertension, hypothyroidism who presents for evaluation of altered mental status. History is gathered mostly from patient's wife was at the bedside. She reports that patient was in his usual state of health yesterday evening when she visited with him. Today he was walking with a walker at rehabilitation when he reached to grab something on the floor and fell. He hit his head on the ground. No LOC. He was assisted by staff to a chair. When his wife arrived patient was sitting in a chair with his eyes closed. She was able to arouse him and he told her what had happened. After that patient became less responsive, able to answer questions yes or no by shaking his head but not opening his eyes and not speaking, he is able to follow commands with all 4 extremities. According to the wife he has exhibited similar behavior in the past when he is sick with an infection or when "his Parkinsons gets really bad". Patient has recently be placed at [facility] and this is his second fall in less than a month there. No recent illness, no fever, URI symptoms, cough, vomiting, or diarrhea.  Past Medical History:  Diagnosis Date  . Arthritis   . Bradycardia   . Chronic kidney disease    enlarged prostrate  . Depression   . Dysrhythmia   . Hypertension   . Hypothyroid   . Hypothyroidism   . Pacemaker   . Parkinson disease ([place])   . Vertigo     Patient Active Problem List   Diagnosis Date Noted  . At risk for  falls 09/29/2016  . 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 Seizure disorder ([place]) 05/05/2016  . Chronic bilateral low back pain without sciatica 05/05/2016  . Hx of thrombocytopenia 11/07/2015  . Normocytic anemia 07/17/2015  . History of sick sinus syndrome 04/16/2015  . Chest wall pain 11/29/2014  . Hypertension 10/11/2014  . Hyperlipidemia 10/11/2014  . Idiopathic Parkinson's disease ([place]) 05/02/2014  . Leg pain, right 05/02/2014  . Back pain, chronic 12/28/2013  . Clinical depression 08/29/2013  . Has a tremor 08/29/2013  . Mechanical and motor problems with internal organs 08/29/2013  . Disordered sleep 08/29/2013  . Amnesia 08/29/2013  . Fatigue 08/29/2013  . Fall 08/29/2013  . Dizziness 08/29/2013  . Difficulty in walking 08/29/2013  . Recurrent major depressive disorder, in partial remission ([place]) 08/29/2013  . Adult hypothyroidism 06/16/2013  . Acquired trigger finger 06/16/2013  . Arrhythmia, sinus node 06/16/2013  . HLD (hyperlipidemia) 06/16/2013  . Benign essential HTN 06/16/2013  . Pure hypercholesterolemia 06/16/2013    Past Surgical History:  Procedure Laterality Date  . APPENDECTOMY    . CARDIAC CATHETERIZATION     09  . CATARACT EXTRACTION    . HERNIA REPAIR  20 years ago   umbiluical   . HERNIA REPAIR Left 07-25-13   inguinal  . INSERT / REPLACE / REMOVE PACEMAKER     dr Ilona Sorrel     09  . JOINT  REPLACEMENT     lt knee  . JOINT REPLACEMENT     right hip  . KNEE SURGERY Left   . LUMBAR LAMINECTOMY/DECOMPRESSION MICRODISCECTOMY  08/06/2011   Procedure: LUMBAR LAMINECTOMY/DECOMPRESSION MICRODISCECTOMY 2 LEVELS;  Surgeon: Eustace Moore, MD;  Location: [place] [facility];  Service: Neurosurgery;  Laterality: N/A;  Lumbar two three, lumbar three four decompressive laminectomy  . PARTIAL HIP ARTHROPLASTY Right 2014  . SHOULDER SURGERY Right   . thumbs     trigger thumbs    Prior to Admission medications   Medication Sig Start Date  End Date Taking? Authorizing Provider  aspirin EC 325 MG tablet Take 325 mg by mouth daily.    [provider]  bisacodyl (DULCOLAX) 5 MG EC tablet Take 1 tablet (5 mg total) by mouth daily as needed for moderate constipation. 03/12/17   Lavonia Drafts, MD  carbidopa-levodopa (SINEMET CR) 50-200 MG tablet Take 1 tablet by mouth 3 (three) times daily. 03/12/17   Lavonia Drafts, MD  citalopram (CELEXA) 20 MG tablet Take 1 tablet (20 mg total) by mouth daily. 03/12/17   Lavonia Drafts, MD  diclofenac sodium (VOLTAREN) 1 % GEL Apply topically.    [provider]  finasteride (PROSCAR) 5 MG tablet Take 1 tablet (5 mg total) by mouth daily. 03/12/17   Lavonia Drafts, MD  fludrocortisone (FLORINEF) 0.1 MG tablet Take 0.05 mg by mouth 2 (two) times daily.    [provider]  fluticasone (FLONASE) 50 MCG/ACT nasal spray SHAKE LIQUID AND USE 2 SPRAYS IN EACH NOSTRIL DAILY 11/29/15   Roselee Nova, MD  lamoTRIgine (LAMICTAL) 150 MG tablet Take 1 tablet (150 mg total) by mouth 2 (two) times daily. 03/12/17   Lavonia Drafts, MD  levothyroxine (SYNTHROID, LEVOTHROID) 75 MCG tablet Take 1 tablet (75 mcg total) by mouth daily before breakfast. 03/12/17   Lavonia Drafts, MD  levothyroxine (SYNTHROID, LEVOTHROID) 88 MCG tablet Take 88 mcg by mouth daily before breakfast.    [provider]  lisinopril (PRINIVIL,ZESTRIL) 10 MG tablet Take 1 tablet (10 mg total) by mouth daily. 03/12/17   Lavonia Drafts, MD  loratadine (CLARITIN) 10 MG tablet Take 1 tablet (10 mg total) by mouth daily. Patient not taking: Reported on 09/08/2016 07/08/15   Roselee Nova, MD  Melatonin 5 MG TABS Take 1 tablet by mouth at bedtime. Reported on 03/21/2015    [provider]  Multiple Vitamin (MULITIVITAMIN WITH MINERALS) TABS Take 1 tablet by mouth daily.    [provider]  naproxen sodium (ANAPROX) 220 MG tablet Take by mouth.    [provider]  pravastatin (PRAVACHOL)  20 MG tablet Take 1 tablet (20 mg total) by mouth daily. 03/12/17   Lavonia Drafts, MD  risperiDONE (RISPERDAL) 2 MG tablet Take 1 tablet (2 mg total) by mouth at bedtime. 03/12/17   Lavonia Drafts, MD  traMADol Veatrice Bourbon) 50 MG tablet Limit one half to one tab by mouth per day or twice per day if tolerated 03/21/15   Mohammed Kindle, MD  traMADol (ULTRAM) 50 MG tablet Take 1 tablet (50 mg total) by mouth 3 (three) times daily as needed for moderate pain. 03/12/17   Harvest Dark, MD    Allergies No known allergies and Adhesive [tape]  Family History  Problem Relation Age of Onset  . Prostate cancer Brother   . Kidney cancer Neg Hx   . Bladder Cancer Neg Hx     Social History Social History   Tobacco  Use  . Smoking status: Former Smoker    Last attempt to quit: 08/04/1961    Years since quitting: 55.7  . Smokeless tobacco: Never Used  Substance Use Topics  . Alcohol use: No    Comment: socially  . Drug use: No    Review of Systems  Constitutional: Negative for fever. + AMS Eyes: Negative for visual changes. ENT: Negative for sore throat. + head trauma Neck: No neck pain  Cardiovascular: Negative for chest pain. Respiratory: Negative for shortness of breath. Gastrointestinal: Negative for abdominal pain, vomiting or diarrhea. Genitourinary: Negative for dysuria. Musculoskeletal: Negative for back pain. Skin: Negative for rash. Neurological: Negative for headaches, weakness or numbness. Psych: No SI or HI  ____________________________________________   PHYSICAL EXAM:  VITAL SIGNS: ED Triage Vitals  Enc Vitals Group     BP 04/04/17 1148 (!) 165/95     Pulse Rate 04/04/17 1148 60     Resp 04/04/17 1148 16     Temp 04/04/17 1148 97.6 F (36.4 C)     Temp Source 04/04/17 1148 Oral     SpO2 04/04/17 1148 100 %     Weight 04/04/17 1146 170 lb (77.1 kg)     Height 04/04/17 1146 5\' 6"  (1.676 m)     Head Circumference --      Peak Flow --      Pain Score --       Pain Loc --      Pain Edu? --      Excl. in [place]? --    Full spinal precautions maintained throughout the trauma exam. Constitutional: Eyes closed, will shake head yes or no. HEENT Head: Normocephalic, hematoma to the R forehead. Face: No facial bony tenderness. Stable midface Ears: No hemotympanum bilaterally. No Battle sign Eyes: No eye injury. PERRL. No raccoon eyes Nose: Nontender. No epistaxis. No rhinorrhea Mouth/Throat: Mucous membranes are moist. No oropharyngeal blood. No dental injury. Airway patent without stridor. Normal voice. Neck: no C-collar in place. No midline c-spine tenderness.  Cardiovascular: Normal rate, regular rhythm. Normal and symmetric distal pulses are present in all extremities. Pulmonary/Chest: Chest wall is stable and nontender to palpation/compression. Normal respiratory effort. Breath sounds are normal. No crepitus.  Abdominal: Soft, nontender, non distended. Musculoskeletal: Nontender with normal full range of motion in all extremities. No deformities. No thoracic or lumbar midline spinal tenderness. Pelvis is stable. Skin: Skin is warm, dry and intact. No abrasions or contutions. Neurological: Aphasic, unable to open eyes, shakes head yes or no to questions, able to squeeze my hand bilaterally, able to smile, able to move eyes, able to wiggle toes.  Glascow Coma Score: 1 - Does not open eyes 6 - Follows simple motor commands 1 - Makes no noise GCS: 8  ____________________________________________   LABS (all labs ordered are listed, but only abnormal results are displayed)  Labs Reviewed  CBC WITH DIFFERENTIAL/PLATELET - Abnormal; Notable for the following components:      Result Value   RBC 4.26 (*)    HCT 39.7 (*)    Platelets 137 (*)    Lymphs Abs 0.8 (*)    All other components within normal limits  COMPREHENSIVE METABOLIC PANEL - Abnormal; Notable for the following components:   Glucose, Bld 121 (*)    ALT 10 (*)    All other components  within normal limits  URINALYSIS, COMPLETE (UACMP) WITH MICROSCOPIC - Abnormal; Notable for the following components:   Color, Urine YELLOW (*)    APPearance HAZY (*)  Ketones, ur 5 (*)    Squamous Epithelial / LPF 0-5 (*)    All other components within normal limits  TROPONIN I   ____________________________________________  EKG  ED ECG REPORT I, Rudene Re, the attending physician, personally viewed and interpreted this ECG.  Normal sinus rhythm, rate of 60, normal intervals, left axis deviation, no ST elevations or depressions, abnormal R-wave progression.  ____________________________________________  RADIOLOGY  Head and cspine CT: 1. Small right frontal scalp hematoma without skull fracture or intracranial hemorrhage. 2. No cervical spine fracture or subluxation. 3. Moderate diffuse cerebral and cerebellar atrophy, chronic small vessel white matter ischemic changes in both cerebral hemispheres and old left basal ganglia lacunar infarct. 4. Multilevel cervical spine degenerative changes. 5. Mild bilateral carotid artery atheromatous calcifications. ____________________________________________   PROCEDURES  Procedure(s) performed: None Procedures Critical Care performed:  None ____________________________________________   INITIAL IMPRESSION / ASSESSMENT AND PLAN / ED COURSE  84 y.o. male with a history of advanced Parkinson's dementia, seizure disorder, hypertension, hypothyroidism who presents for evaluation of altered mental status after a fall with head trauma today. Patient is awake but unable to open his eyes or speak, follows commands and answers questions yes and no by shaking his head. Has a large forehead hematoma. Ddx concussion vs head bleed vs stroke vs behavioral (wife reports that patient has behaved like this in the past when sick). Plan for head CT, interrogation of pacemaker, labs, urine, and close monitoring.     _________________________ 1:38 PM on 04/04/2017 -----------------------------------------  labs showing no evidence of dehydration or infection, UA is negative, head CT and C-spine CT negative for acute injuries. Pacemaker was interrogated and functioning properly. Patient is now back to his baseline according to his wife was at bedside. Patient has no complaints at this time. He is pleasant and smiling. We'll discharge back to his facility. Recommended close follow-up with primary care doctor.   As part of my medical decision making, I reviewed the following data within the [place] History obtained from family, Nursing notes reviewed and incorporated, Labs reviewed , EKG interpreted , Old EKG reviewed, Radiograph reviewed , Notes from prior ED visits and [place] Controlled Substance Database    Pertinent labs & imaging results that were available during my care of the patient were reviewed by me and considered in my medical decision making (see chart for details).    ____________________________________________   FINAL CLINICAL IMPRESSION(S) / ED DIAGNOSES  Final diagnoses:  Altered mental status, unspecified altered mental status type  Fall, initial encounter      NEW MEDICATIONS STARTED DURING THIS VISIT:  ED Discharge Orders    None       Note:  This document was prepared using Dragon voice recognition software and may include unintentional dictation errors.    Alfred Levins, [place], MD 04/04/17 1339

## 2017-04-04 NOTE — Discharge Instructions (Signed)

## 2017-04-04 NOTE — ED Triage Notes (Signed)
Patient from [facility] via [facility]. Per wife, patient fell sometime this morning and hit forehead. Hematoma noted above right eye. Wife also reports patient is not responding or interacting like normal. Patient able to follow commands but with effort. Generalized weakness noted. Patient not communicating verbally at this time.

## 2017-04-05 ENCOUNTER — Other Ambulatory Visit: Payer: Self-pay | Admitting: *Deleted

## 2017-04-05 NOTE — Patient Outreach (Signed)
[facility]) Care Management  04/05/2017  Lawrence Valdez Sr. 04-21-1931 [id number]   Phone call from patient's spouse stating that patient had a fall while in rehab and had been taken to the ED. Patient's spouse states that he was taken back to the facility, however she hs some concerns about his care due to a medication that was not given on time. Per patient's spouse, she was able to speak with the Nursing Director bout her concerns. Patient's spouse, further discussed concern  about patient's future needs and the fact that she does not know how long she can provide total care for him. Patient's spouse now interested in  exploring long term care for patient but has concerns about meeting her financial responsibilities alone due to her minimal income. This social worker reviewed patient's options regarding in home care versus cost of facility care. This social recommended discussing long term care needs with the discharge planner [name].  Phone call to the discharge planner, [name] who recommended that patient's spouse make an appointment with [name] in the business office for assistance with applying for Medicaid. The discharge planner also stated that she will discuss discharge planning options with her.   Patient states that she will  contact [name] to schedule an appointment for assistance with the long term care Medicaid application.   Sheralyn Boatman [facility] Care Management [phone]

## 2017-04-06 ENCOUNTER — Emergency Department: Payer: PPO

## 2017-04-06 ENCOUNTER — Emergency Department
Admission: EM | Admit: 2017-04-06 | Discharge: 2017-04-06 | Disposition: A | Discharge status: Home / Self Care | Payer: PPO | Attending: Emergency Medicine | Admitting: Emergency Medicine

## 2017-04-06 ENCOUNTER — Encounter: Payer: Self-pay | Admitting: Intensive Care

## 2017-04-06 DIAGNOSIS — I10 Essential (primary) hypertension: Secondary | ICD-10-CM | POA: Insufficient documentation

## 2017-04-06 DIAGNOSIS — Z87891 Personal history of nicotine dependence: Secondary | ICD-10-CM | POA: Diagnosis not present

## 2017-04-06 DIAGNOSIS — R4182 Altered mental status, unspecified: Secondary | ICD-10-CM | POA: Diagnosis not present

## 2017-04-06 DIAGNOSIS — G20 Parkinson's disease: Secondary | ICD-10-CM | POA: Insufficient documentation

## 2017-04-06 DIAGNOSIS — Z79899 Other long term (current) drug therapy: Secondary | ICD-10-CM | POA: Diagnosis not present

## 2017-04-06 DIAGNOSIS — E039 Hypothyroidism, unspecified: Secondary | ICD-10-CM | POA: Diagnosis not present

## 2017-04-06 DIAGNOSIS — Z950 Presence of cardiac pacemaker: Secondary | ICD-10-CM | POA: Insufficient documentation

## 2017-04-06 DIAGNOSIS — R569 Unspecified convulsions: Secondary | ICD-10-CM | POA: Diagnosis not present

## 2017-04-06 DIAGNOSIS — E785 Hyperlipidemia, unspecified: Secondary | ICD-10-CM | POA: Diagnosis not present

## 2017-04-06 DIAGNOSIS — R4020 Unspecified coma: Secondary | ICD-10-CM | POA: Diagnosis not present

## 2017-04-06 LAB — URINALYSIS, COMPLETE (UACMP) WITH MICROSCOPIC
BACTERIA UA: NONE SEEN
BILIRUBIN URINE: NEGATIVE
Glucose, UA: NEGATIVE mg/dL
Hgb urine dipstick: NEGATIVE
Ketones, ur: 5 mg/dL — AB
Leukocytes, UA: NEGATIVE
Nitrite: NEGATIVE
PROTEIN: NEGATIVE mg/dL
SPECIFIC GRAVITY, URINE: 1.018 (ref 1.005–1.030)
SQUAMOUS EPITHELIAL / LPF: NONE SEEN
pH: 6 (ref 5.0–8.0)

## 2017-04-06 LAB — CBC WITH DIFFERENTIAL/PLATELET
BASOS ABS: 0 10*3/uL (ref 0–0.1)
Basophils Relative: 0 %
Eosinophils Absolute: 0.2 10*3/uL (ref 0–0.7)
Eosinophils Relative: 4 %
HEMATOCRIT: 41.8 % (ref 40.0–52.0)
Hemoglobin: 14.3 g/dL (ref 13.0–18.0)
LYMPHS PCT: 26 %
Lymphs Abs: 1.3 10*3/uL (ref 1.0–3.6)
MCH: 31.7 pg (ref 26.0–34.0)
MCHC: 34.1 g/dL (ref 32.0–36.0)
MCV: 92.9 fL (ref 80.0–100.0)
Monocytes Absolute: 0.3 10*3/uL (ref 0.2–1.0)
Monocytes Relative: 6 %
NEUTROS ABS: 3 10*3/uL (ref 1.4–6.5)
Neutrophils Relative %: 64 %
Platelets: 134 10*3/uL — ABNORMAL LOW (ref 150–440)
RBC: 4.5 MIL/uL (ref 4.40–5.90)
RDW: 14 % (ref 11.5–14.5)
WBC: 4.8 10*3/uL (ref 3.8–10.6)

## 2017-04-06 LAB — COMPREHENSIVE METABOLIC PANEL
ALT: 5 U/L — AB (ref 17–63)
AST: 24 U/L (ref 15–41)
Albumin: 4.2 g/dL (ref 3.5–5.0)
Alkaline Phosphatase: 51 U/L (ref 38–126)
Anion gap: 11 (ref 5–15)
BILIRUBIN TOTAL: 1 mg/dL (ref 0.3–1.2)
BUN: 20 mg/dL (ref 6–20)
CO2: 27 mmol/L (ref 22–32)
Calcium: 9.7 mg/dL (ref 8.9–10.3)
Chloride: 103 mmol/L (ref 101–111)
Creatinine, Ser: 1.16 mg/dL (ref 0.61–1.24)
GFR calc Af Amer: >60 mL/min (ref >60)
GFR, EST NON AFRICAN AMERICAN: 56 mL/min — AB (ref >60)
GLUCOSE: 93 mg/dL (ref 65–99)
Potassium: 3.5 mmol/L (ref 3.5–5.1)
Sodium: 141 mmol/L (ref 135–145)
TOTAL PROTEIN: 7.4 g/dL (ref 6.5–8.1)

## 2017-04-06 LAB — TSH: TSH: 5.015 u[IU]/mL — AB (ref 0.350–4.500)

## 2017-04-06 LAB — TROPONIN I

## 2017-04-06 NOTE — ED Triage Notes (Signed)
From [facility] by EMS for AMS. Staff reports he is normally A&O x4. Last seen normal by staff this morning around 8am. EMS vitals 170/82b/p, 94% RA, HR 63, NS 12 lead. Staff reports patient has fallen recently but they were unsure when.

## 2017-04-06 NOTE — ED Notes (Signed)
Since wife arrived, patient is opening his eyes and speaking with her. Patient is A&O x4

## 2017-04-06 NOTE — ED Notes (Signed)
Patient brief changed by this RN

## 2017-04-06 NOTE — ED Provider Notes (Signed)
[facility] Emergency Department Provider Note  ____________________________________________   First MD Initiated Contact with Patient 04/06/17 [phone]     (approximate)  I have reviewed the triage vital signs and the nursing notes.   HISTORY  Chief Complaint Altered Mental Status   HPI Lawrence HUTMACHER Sr. is a 84 y.o. male a history of advanced Parkinson's and parkinsonian dementia who is presenting to the emergency department today with an altered mental status.  He is accompanied by his wife who says that he started to decline yesterday.  She says that he has been less responsive and has had 2 falls over the past 3 weeks at [facility] he is going for rehabilitation.  She states that he has been like this before after seizures or when he has a stressor such as an infection.  Per EMS, the patient has been minimally responsive and was last seen normal by staff at 8 AM.  The patient's wife said that he had been able to have a conversation as of last week but has been experiencing worsening decline.    Past Medical History:  Diagnosis Date  . Arthritis   . Bradycardia   . Chronic kidney disease    enlarged prostrate  . Depression   . Dysrhythmia   . Hypertension   . Hypothyroid   . Hypothyroidism   . Pacemaker   . Parkinson disease ([place])   . Vertigo     Patient Active Problem List   Diagnosis Date Noted  . At risk for falls 09/29/2016  . 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 Seizure disorder ([place]) 05/05/2016  . Chronic bilateral low back pain without sciatica 05/05/2016  . Hx of thrombocytopenia 11/07/2015  . Normocytic anemia 07/17/2015  . History of sick sinus syndrome 04/16/2015  . Chest wall pain 11/29/2014  . Hypertension 10/11/2014  . Hyperlipidemia 10/11/2014  . Idiopathic Parkinson's disease ([place]) 05/02/2014  . Leg pain, right 05/02/2014  . Back pain, chronic 12/28/2013  . Clinical depression 08/29/2013  .  Has a tremor 08/29/2013  . Mechanical and motor problems with internal organs 08/29/2013  . Disordered sleep 08/29/2013  . Amnesia 08/29/2013  . Fatigue 08/29/2013  . Fall 08/29/2013  . Dizziness 08/29/2013  . Difficulty in walking 08/29/2013  . Recurrent major depressive disorder, in partial remission ([place]) 08/29/2013  . Adult hypothyroidism 06/16/2013  . Acquired trigger finger 06/16/2013  . Arrhythmia, sinus node 06/16/2013  . HLD (hyperlipidemia) 06/16/2013  . Benign essential HTN 06/16/2013  . Pure hypercholesterolemia 06/16/2013    Past Surgical History:  Procedure Laterality Date  . APPENDECTOMY    . CARDIAC CATHETERIZATION     09  . CATARACT EXTRACTION    . HERNIA REPAIR  20 years ago   umbiluical   . HERNIA REPAIR Left 07-25-13   inguinal  . INSERT / REPLACE / REMOVE PACEMAKER     dr Ilona Sorrel     09  . JOINT REPLACEMENT     lt knee  . JOINT REPLACEMENT     right hip  . KNEE SURGERY Left   . LUMBAR LAMINECTOMY/DECOMPRESSION MICRODISCECTOMY  08/06/2011   Procedure: LUMBAR LAMINECTOMY/DECOMPRESSION MICRODISCECTOMY 2 LEVELS;  Surgeon: Eustace Moore, MD;  Location: [place] [facility];  Service: Neurosurgery;  Laterality: N/A;  Lumbar two three, lumbar three four decompressive laminectomy  . PARTIAL HIP ARTHROPLASTY Right 2014  . SHOULDER SURGERY Right   . thumbs     trigger thumbs  Prior to Admission medications   Medication Sig Start Date End Date Taking? Authorizing Provider  carbidopa-levodopa (SINEMET CR) 50-200 MG tablet Take 1 tablet by mouth 3 (three) times daily. 03/12/17  Yes Lavonia Drafts, MD  citalopram (CELEXA) 20 MG tablet Take 1 tablet (20 mg total) by mouth daily. 03/12/17  Yes Lavonia Drafts, MD  diclofenac sodium (VOLTAREN) 1 % GEL Apply 1 application topically as directed.    Yes [provider]  finasteride (PROSCAR) 5 MG tablet Take 1 tablet (5 mg total) by mouth daily. 03/12/17  Yes Lavonia Drafts, MD  fluticasone [facility]) 50 MCG/ACT  nasal spray SHAKE LIQUID AND USE 2 SPRAYS IN [facility] NOSTRIL DAILY 11/29/15  Yes Roselee Nova, MD  lamoTRIgine (LAMICTAL) 150 MG tablet Take 1 tablet (150 mg total) by mouth 2 (two) times daily. 03/12/17  Yes Lavonia Drafts, MD  levothyroxine (SYNTHROID, LEVOTHROID) 88 MCG tablet Take 88 mcg by mouth daily before breakfast.   Yes [provider]  lisinopril (PRINIVIL,ZESTRIL) 10 MG tablet Take 1 tablet (10 mg total) by mouth daily. 03/12/17  Yes Lavonia Drafts, MD  Melatonin 5 MG TABS Take 1 tablet by mouth at bedtime. Reported on 03/21/2015   Yes [provider]  Multiple Vitamin (MULITIVITAMIN WITH MINERALS) TABS Take 1 tablet by mouth daily.   Yes [provider]  naproxen sodium (ANAPROX) 220 MG tablet Take by mouth as directed.    Yes [provider]  pravastatin (PRAVACHOL) 20 MG tablet Take 1 tablet (20 mg total) by mouth daily. 03/12/17  Yes Lavonia Drafts, MD  risperiDONE (RISPERDAL) 2 MG tablet Take 1 tablet (2 mg total) by mouth at bedtime. 03/12/17  Yes Lavonia Drafts, MD  traMADol (ULTRAM) 50 MG tablet Take 1 tablet (50 mg total) by mouth 3 (three) times daily as needed for moderate pain. Patient taking differently: Take 50 mg by mouth every 8 (eight) hours as needed for moderate pain.  03/12/17  Yes Harvest Dark, MD  bisacodyl (DULCOLAX) 5 MG EC tablet Take 1 tablet (5 mg total) by mouth daily as needed for moderate constipation. Patient taking differently: Take 5 mg by mouth every other day.  03/12/17   Lavonia Drafts, MD  levothyroxine (SYNTHROID, LEVOTHROID) 75 MCG tablet Take 1 tablet (75 mcg total) by mouth daily before breakfast. Patient not taking: Reported on 04/06/2017 03/12/17   Lavonia Drafts, MD  loratadine (CLARITIN) 10 MG tablet Take 1 tablet (10 mg total) by mouth daily. Patient not taking: Reported on 09/08/2016 07/08/15   Roselee Nova, MD    Allergies No known allergies and Adhesive [tape]  Family History  Problem  Relation Age of Onset  . Prostate cancer Brother   . Kidney cancer Neg Hx   . Bladder Cancer Neg Hx     Social History Social History   Tobacco Use  . Smoking status: Former Smoker    Last attempt to quit: 08/04/1961    Years since quitting: 55.7  . Smokeless tobacco: Never Used  Substance Use Topics  . Alcohol use: No    Comment: socially  . Drug use: No    Review of Systems  Level 5 caveat secondary to patient nonverbal at this time.   ____________________________________________   PHYSICAL EXAM:  VITAL SIGNS: ED Triage Vitals  Enc Vitals Group     BP 04/06/17 0939 (!) 188/91     Pulse Rate 04/06/17 0939 72     Resp 04/06/17 0939 14     Temp  04/06/17 0939 98.9 F (37.2 C)     Temp Source 04/06/17 0939 Oral     SpO2 04/06/17 0934 96 %     Weight 04/06/17 0941 170 lb (77.1 kg)     Height 04/06/17 0941 5\' 6"  (1.676 m)     Head Circumference --      Peak Flow --      Pain Score --      Pain Loc --      Pain Edu? --      Excl. in [place]? --     Constitutional: in no acute distress. Eyes: Conjunctivae are normal.  PERRLA Head: Atraumatic. Nose: No congestion/rhinnorhea. Mouth/Throat: Mucous membranes are moist.  Neck: No stridor.   Cardiovascular: Normal rate, regular rhythm. Grossly normal heart sounds.  Good peripheral circulation. Respiratory: Normal respiratory effort.  No retractions. Lungs CTAB. Gastrointestinal: Soft and nontender (no grimace). No distention. No CVA tenderness. Musculoskeletal: No lower extremity tenderness nor edema.  No joint effusions. Neurologic:  No gross focal neurologic deficits are appreciated: No facial drooping.  Appears to have good tone throughout this but not responding to commands.  Resting tremor to the right upper extremity Skin:  Skin is warm, dry and intact. No rash noted.   ____________________________________________   LABS (all labs ordered are listed, but only abnormal results are displayed)  Labs Reviewed  CBC  WITH DIFFERENTIAL/PLATELET  COMPREHENSIVE METABOLIC PANEL  TROPONIN I  URINALYSIS, COMPLETE (UACMP) WITH MICROSCOPIC  TSH   ____________________________________________  EKG  ED ECG REPORT I, Doran Stabler, the attending physician, personally viewed and interpreted this ECG.   Date: 04/06/2017  EKG Time: 0944  Rate: 61  Rhythm: atrial paced rythm  Axis: normal  Intervals:none  ST&T Change: No ST segment elevation or depression.  No abnormal T wave inversion.  ____________________________________________  RADIOLOGY  No acute finding on the chest x-ray or CT of the head. ____________________________________________   PROCEDURES  Procedure(s) performed:   Procedures  Critical Care performed:   ____________________________________________   INITIAL IMPRESSION / ASSESSMENT AND PLAN / ED COURSE  Pertinent labs & imaging results that were available during my care of the patient were reviewed by me and considered in my medical decision making (see chart for details).  Differential diagnosis includes, but is not limited to, alcohol, illicit or prescription medications, or other toxic ingestion; intracranial pathology such as stroke or intracerebral hemorrhage; fever or infectious causes including sepsis; hypoxemia and/or hypercarbia; uremia; trauma; endocrine related disorders such as diabetes, hypoglycemia, and thyroid-related diseases; hypertensive encephalopathy; etc.  As part of my medical decision making, I reviewed the following data within the electronic MEDICAL RECORD NUMBER Notes from prior ED visits  ----------------------------------------- 11:52 AM on 04/06/2017 -----------------------------------------  Reevaluated the patient and he is at his baseline status per his wife.  She said that he was supposed to have an appointment this morning with the neurologist at 11:30 a.m.  Wife thinks that this could have been postictal from seizure.  Patient is awake and  alert at this time.  Patient will be discharged.  I advised the patient's family to call immediately over to the neurologist for follow-up visit as soon as this afternoon.  They are understanding and willing to comply.      ____________________________________________   FINAL CLINICAL IMPRESSION(S) / ED DIAGNOSES  Altered mental status, resolved    NEW MEDICATIONS STARTED DURING THIS VISIT:  New Prescriptions   No medications on file     Note:  This document  was prepared using Systems analyst and may include unintentional dictation errors.     Orbie Pyo, MD 04/06/17 1154

## 2017-04-07 ENCOUNTER — Other Ambulatory Visit: Payer: Self-pay | Admitting: *Deleted

## 2017-04-07 NOTE — Patient Outreach (Addendum)
[facility]) Care Management  04/07/2017  JERIME ARIF Sr. 09/14/1931 [id number]   Voicemail message received from patient's spouse stating that patient's continued stay at the [facility] was declined and that she had to bring patient home. "I am not sure what to do now".  This Education officer, museum returned the call, however the phone just rang, no voicemail available to leave a message.  This social worker to continue attempts to reach patient's spouse.  Second attempt to reach patient's spouse, was able to leave a message requesting a return call.  Sheralyn Boatman [facility] Care Management [phone]

## 2017-04-09 ENCOUNTER — Other Ambulatory Visit: Payer: Self-pay | Admitting: *Deleted

## 2017-04-09 NOTE — Patient Outreach (Addendum)
[facility]) Care Management  04/09/2017  SHOAIB SIEFKER Sr. June 16, 1931 [id number]   Follow up phone call to patient's spouse for an update on patient. Per patient's spouse, he has been moved to the long term care side of [facility]. Per patient's spouse, patient has been declined further rehab services.  Home and is currently receiving assistance from the discharge planner there to apply for long term Medicaid to help with the cost of care. Per patient's spouse, she may look into another facility to transfer patient to but for now he will be residing there. Patient's spouse very appreciative of the assistance provided by this Education officer, museum and understands that patient will be closed to [facility] care management due to his transition to long term care. Patient's spouse provided with this social worker's contact information  the event that patient's long term care plan changes.   Sheralyn Boatman [facility] Care Management [phone]

## 2017-04-14 DIAGNOSIS — M6281 Muscle weakness (generalized): Secondary | ICD-10-CM | POA: Diagnosis not present

## 2017-04-14 DIAGNOSIS — R279 Unspecified lack of coordination: Secondary | ICD-10-CM | POA: Diagnosis not present

## 2017-04-14 DIAGNOSIS — G20 Parkinson's disease: Secondary | ICD-10-CM | POA: Diagnosis not present

## 2017-04-14 DIAGNOSIS — Z9181 History of falling: Secondary | ICD-10-CM | POA: Diagnosis not present

## 2017-04-14 DIAGNOSIS — R262 Difficulty in walking, not elsewhere classified: Secondary | ICD-10-CM | POA: Diagnosis not present

## 2017-04-17 ENCOUNTER — Encounter: Payer: Self-pay | Admitting: Emergency Medicine

## 2017-04-17 ENCOUNTER — Emergency Department
Admission: EM | Admit: 2017-04-17 | Discharge: 2017-04-17 | Disposition: A | Discharge status: Home / Self Care | Payer: PPO | Attending: Emergency Medicine | Admitting: Emergency Medicine

## 2017-04-17 ENCOUNTER — Other Ambulatory Visit: Payer: Self-pay

## 2017-04-17 DIAGNOSIS — I129 Hypertensive chronic kidney disease with stage 1 through stage 4 chronic kidney disease, or unspecified chronic kidney disease: Secondary | ICD-10-CM | POA: Diagnosis not present

## 2017-04-17 DIAGNOSIS — G20 Parkinson's disease: Secondary | ICD-10-CM | POA: Diagnosis not present

## 2017-04-17 DIAGNOSIS — Z79899 Other long term (current) drug therapy: Secondary | ICD-10-CM | POA: Insufficient documentation

## 2017-04-17 DIAGNOSIS — Z950 Presence of cardiac pacemaker: Secondary | ICD-10-CM | POA: Insufficient documentation

## 2017-04-17 DIAGNOSIS — Z96641 Presence of right artificial hip joint: Secondary | ICD-10-CM | POA: Diagnosis not present

## 2017-04-17 DIAGNOSIS — Z87891 Personal history of nicotine dependence: Secondary | ICD-10-CM | POA: Insufficient documentation

## 2017-04-17 DIAGNOSIS — E039 Hypothyroidism, unspecified: Secondary | ICD-10-CM | POA: Diagnosis not present

## 2017-04-17 DIAGNOSIS — Z96652 Presence of left artificial knee joint: Secondary | ICD-10-CM | POA: Diagnosis not present

## 2017-04-17 DIAGNOSIS — R404 Transient alteration of awareness: Secondary | ICD-10-CM

## 2017-04-17 DIAGNOSIS — F329 Major depressive disorder, single episode, unspecified: Secondary | ICD-10-CM | POA: Diagnosis not present

## 2017-04-17 DIAGNOSIS — Z743 Need for continuous supervision: Secondary | ICD-10-CM | POA: Diagnosis not present

## 2017-04-17 DIAGNOSIS — N189 Chronic kidney disease, unspecified: Secondary | ICD-10-CM | POA: Diagnosis not present

## 2017-04-17 DIAGNOSIS — R4182 Altered mental status, unspecified: Secondary | ICD-10-CM | POA: Diagnosis not present

## 2017-04-17 NOTE — ED Provider Notes (Signed)
[facility] Emergency Department Provider Note   ____________________________________________    I have reviewed the triage vital signs and the nursing notes.   HISTORY  Chief Complaint Altered Mental Status     HPI Lawrence WARSHAW Sr. is a 84 y.o. male who presents with altered mental status.  Patient is unable to provide any significant history.  Reportedly staff at nursing facility found him unresponsive.  Daughter showed up shortly after the patient's arrival and reports this is quite common occurrence for him which he attributes to seizures.  I reviewed the medical records and come to a similar conclusion, the patient has been into the emergency department similar times with refusal to speak and then returned to baseline shortly thereafter.  Daughter has asks no workup to be done and I think this is reasonable.   Past Medical History:  Diagnosis Date  . Arthritis   . Bradycardia   . Chronic kidney disease    enlarged prostrate  . Depression   . Dysrhythmia   . Hypertension   . Hypothyroid   . Hypothyroidism   . Pacemaker   . Parkinson disease ([place])   . Vertigo     Patient Active Problem List   Diagnosis Date Noted  . At risk for falls 09/29/2016  . 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 Seizure disorder ([place]) 05/05/2016  . Chronic bilateral low back pain without sciatica 05/05/2016  . Hx of thrombocytopenia 11/07/2015  . Normocytic anemia 07/17/2015  . History of sick sinus syndrome 04/16/2015  . Chest wall pain 11/29/2014  . Hypertension 10/11/2014  . Hyperlipidemia 10/11/2014  . Idiopathic Parkinson's disease ([place]) 05/02/2014  . Leg pain, right 05/02/2014  . Back pain, chronic 12/28/2013  . Clinical depression 08/29/2013  . Has a tremor 08/29/2013  . Mechanical and motor problems with internal organs 08/29/2013  . Disordered sleep 08/29/2013  . Amnesia 08/29/2013  . Fatigue 08/29/2013  . Fall 08/29/2013   . Dizziness 08/29/2013  . Difficulty in walking 08/29/2013  . Recurrent major depressive disorder, in partial remission ([place]) 08/29/2013  . Adult hypothyroidism 06/16/2013  . Acquired trigger finger 06/16/2013  . Arrhythmia, sinus node 06/16/2013  . HLD (hyperlipidemia) 06/16/2013  . Benign essential HTN 06/16/2013  . Pure hypercholesterolemia 06/16/2013    Past Surgical History:  Procedure Laterality Date  . APPENDECTOMY    . CARDIAC CATHETERIZATION     09  . CATARACT EXTRACTION    . HERNIA REPAIR  20 years ago   umbiluical   . HERNIA REPAIR Left 07-25-13   inguinal  . INSERT / REPLACE / REMOVE PACEMAKER     dr Lawrence Valdez     09  . JOINT REPLACEMENT     lt knee  . JOINT REPLACEMENT     right hip  . KNEE SURGERY Left   . LUMBAR LAMINECTOMY/DECOMPRESSION MICRODISCECTOMY  08/06/2011   Procedure: LUMBAR LAMINECTOMY/DECOMPRESSION MICRODISCECTOMY 2 LEVELS;  Surgeon: Eustace Moore, MD;  Location: [place] [facility];  Service: Neurosurgery;  Laterality: N/A;  Lumbar two three, lumbar three four decompressive laminectomy  . PARTIAL HIP ARTHROPLASTY Right 2014  . SHOULDER SURGERY Right   . thumbs     trigger thumbs    Prior to Admission medications   Medication Sig Start Date End Date Taking? Authorizing Provider  bisacodyl (DULCOLAX) 5 MG EC tablet Take 1 tablet (5 mg total) by mouth daily as needed for moderate constipation. Patient taking differently: Take 5 mg  by mouth every other day.  03/12/17   Lavonia Drafts, MD  carbidopa-levodopa (SINEMET CR) 50-200 MG tablet Take 1 tablet by mouth 3 (three) times daily. 03/12/17   Lavonia Drafts, MD  citalopram (CELEXA) 20 MG tablet Take 1 tablet (20 mg total) by mouth daily. 03/12/17   Lavonia Drafts, MD  diclofenac sodium (VOLTAREN) 1 % GEL Apply 1 application topically as directed.     [provider]  finasteride (PROSCAR) 5 MG tablet Take 1 tablet (5 mg total) by mouth daily. 03/12/17   Lavonia Drafts, MD  fluticasone  [facility]) 50 MCG/ACT nasal spray SHAKE LIQUID AND USE 2 SPRAYS IN [facility] NOSTRIL DAILY 11/29/15   Roselee Nova, MD  lamoTRIgine (LAMICTAL) 150 MG tablet Take 1 tablet (150 mg total) by mouth 2 (two) times daily. 03/12/17   Lavonia Drafts, MD  levothyroxine (SYNTHROID, LEVOTHROID) 75 MCG tablet Take 1 tablet (75 mcg total) by mouth daily before breakfast. Patient not taking: Reported on 04/06/2017 03/12/17   Lavonia Drafts, MD  levothyroxine (SYNTHROID, LEVOTHROID) 88 MCG tablet Take 88 mcg by mouth daily before breakfast.    [provider]  lisinopril (PRINIVIL,ZESTRIL) 10 MG tablet Take 1 tablet (10 mg total) by mouth daily. 03/12/17   Lavonia Drafts, MD  loratadine (CLARITIN) 10 MG tablet Take 1 tablet (10 mg total) by mouth daily. Patient not taking: Reported on 09/08/2016 07/08/15   Roselee Nova, MD  Melatonin 5 MG TABS Take 1 tablet by mouth at bedtime. Reported on 03/21/2015    [provider]  Multiple Vitamin (MULITIVITAMIN WITH MINERALS) TABS Take 1 tablet by mouth daily.    [provider]  naproxen sodium (ANAPROX) 220 MG tablet Take by mouth as directed.     [provider]  pravastatin (PRAVACHOL) 20 MG tablet Take 1 tablet (20 mg total) by mouth daily. 03/12/17   Lavonia Drafts, MD  risperiDONE (RISPERDAL) 2 MG tablet Take 1 tablet (2 mg total) by mouth at bedtime. 03/12/17   Lavonia Drafts, MD  traMADol (ULTRAM) 50 MG tablet Take 1 tablet (50 mg total) by mouth 3 (three) times daily as needed for moderate pain. Patient taking differently: Take 50 mg by mouth every 8 (eight) hours as needed for moderate pain.  03/12/17   Harvest Dark, MD     Allergies No known allergies and Adhesive [tape]  Family History  Problem Relation Age of Onset  . Prostate cancer Brother   . Kidney cancer Neg Hx   . Bladder Cancer Neg Hx     Social History Social History   Tobacco Use  . Smoking status: Former Smoker    Last attempt to quit: 08/04/1961     Years since quitting: 55.7  . Smokeless tobacco: Never Used  Substance Use Topics  . Alcohol use: No    Comment: socially  . Drug use: No    5 caveat: Unable to obtain review of systems due to altered mental status     ____________________________________________   PHYSICAL EXAM:  VITAL SIGNS: ED Triage Vitals  Enc Vitals Group     BP 04/17/17 1929 (!) 192/96     Pulse Rate 04/17/17 1925 70     Resp 04/17/17 1925 11     Temp 04/17/17 1929 98.4 F (36.9 C)     Temp Source 04/17/17 1929 Axillary     SpO2 04/17/17 1929 96 %     Weight 04/17/17 1925 77.1 kg (170 lb)     Height  04/17/17 1925 1.676 m (5\' 6" )     Head Circumference --      Peak Flow --      Pain Score --      Pain Loc --      Pain Edu? --      Excl. in [place]? --     Constitutional: Unresponsive, withdraws from pain Eyes: Conjunctivae are normal.  Head: Atraumatic. Nose: No congestion/rhinnorhea. Mouth/Throat: Mucous membranes are moist.    Cardiovascular: Normal rate, regular rhythm. Grossly normal heart sounds.  Good peripheral circulation. Respiratory: Normal respiratory effort.  No retractions. Lungs CTAB. Gastrointestinal: Soft and nontender. No distention. Genitourinary: deferred Musculoskeletal:  Warm and well perfused Neurologic: Patient not answering questions, unresponsive Skin:  Skin is warm, dry and intact. No rash noted.   ____________________________________________   LABS (all labs ordered are listed, but only abnormal results are displayed)  Labs Reviewed - No data to display ____________________________________________  EKG  None ____________________________________________  RADIOLOGY  None ____________________________________________   PROCEDURES  Procedure(s) performed: No  Procedures   Critical Care performed: No ____________________________________________   INITIAL IMPRESSION / ASSESSMENT AND PLAN / ED COURSE  Pertinent labs & imaging results that  were available during my care of the patient were reviewed by me and considered in my medical decision making (see chart for details).  As daughter predicted the patient rapidly came back to baseline upon her arrival and began opening his eyes and answering all questions requested a drink of water.  Given he is at his baseline I will discharge the patient back to nursing facility    ____________________________________________   FINAL CLINICAL IMPRESSION(S) / ED DIAGNOSES  Final diagnoses:  Transient alteration of awareness        Note:  This document was prepared using Dragon voice recognition software and may include unintentional dictation errors.    Lavonia Drafts, MD 04/17/17 2348

## 2017-04-17 NOTE — ED Triage Notes (Signed)
Pt to ED via EMS from [facility] today.  Per EMS patient was found in bed by staff, responsive to painful stimuli.  EMS vitals: 129 CBG, 96% RA, 98.3 temp, 201/113 BP.

## 2017-04-17 NOTE — ED Notes (Signed)
Patient spontaneously opening eyes, speaking coherently to this nurse, has been drinking water per daughter.

## 2017-04-18 ENCOUNTER — Encounter: Payer: Self-pay | Admitting: *Deleted

## 2017-04-23 DIAGNOSIS — R279 Unspecified lack of coordination: Secondary | ICD-10-CM | POA: Diagnosis not present

## 2017-04-23 DIAGNOSIS — Z9181 History of falling: Secondary | ICD-10-CM | POA: Diagnosis not present

## 2017-04-23 DIAGNOSIS — G20 Parkinson's disease: Secondary | ICD-10-CM | POA: Diagnosis not present

## 2017-04-23 DIAGNOSIS — R262 Difficulty in walking, not elsewhere classified: Secondary | ICD-10-CM | POA: Diagnosis not present

## 2017-04-23 DIAGNOSIS — M6281 Muscle weakness (generalized): Secondary | ICD-10-CM | POA: Diagnosis not present

## 2017-05-11 DIAGNOSIS — G3183 Dementia with Lewy bodies: Secondary | ICD-10-CM | POA: Diagnosis not present

## 2017-05-11 DIAGNOSIS — F331 Major depressive disorder, recurrent, moderate: Secondary | ICD-10-CM | POA: Diagnosis not present

## 2017-05-11 DIAGNOSIS — G20 Parkinson's disease: Secondary | ICD-10-CM | POA: Diagnosis not present

## 2017-05-18 DIAGNOSIS — G20 Parkinson's disease: Secondary | ICD-10-CM | POA: Diagnosis not present

## 2017-05-18 DIAGNOSIS — F331 Major depressive disorder, recurrent, moderate: Secondary | ICD-10-CM | POA: Diagnosis not present

## 2017-05-20 DIAGNOSIS — M5441 Lumbago with sciatica, right side: Secondary | ICD-10-CM | POA: Diagnosis not present

## 2017-05-20 DIAGNOSIS — R569 Unspecified convulsions: Secondary | ICD-10-CM | POA: Diagnosis not present

## 2017-05-20 DIAGNOSIS — Z8673 Personal history of transient ischemic attack (TIA), and cerebral infarction without residual deficits: Secondary | ICD-10-CM | POA: Diagnosis not present

## 2017-05-20 DIAGNOSIS — G309 Alzheimer's disease, unspecified: Secondary | ICD-10-CM | POA: Diagnosis not present

## 2017-05-20 DIAGNOSIS — R5382 Chronic fatigue, unspecified: Secondary | ICD-10-CM | POA: Diagnosis not present

## 2017-05-20 DIAGNOSIS — F0280 Dementia in other diseases classified elsewhere without behavioral disturbance: Secondary | ICD-10-CM | POA: Diagnosis not present

## 2017-05-20 DIAGNOSIS — G479 Sleep disorder, unspecified: Secondary | ICD-10-CM | POA: Diagnosis not present

## 2017-05-20 DIAGNOSIS — F3341 Major depressive disorder, recurrent, in partial remission: Secondary | ICD-10-CM | POA: Diagnosis not present

## 2017-05-20 DIAGNOSIS — G20 Parkinson's disease: Secondary | ICD-10-CM | POA: Diagnosis not present

## 2017-05-20 DIAGNOSIS — F0150 Vascular dementia without behavioral disturbance: Secondary | ICD-10-CM | POA: Diagnosis not present

## 2017-05-20 DIAGNOSIS — G8929 Other chronic pain: Secondary | ICD-10-CM | POA: Diagnosis not present

## 2017-05-28 DIAGNOSIS — F331 Major depressive disorder, recurrent, moderate: Secondary | ICD-10-CM | POA: Diagnosis not present

## 2017-05-28 DIAGNOSIS — G20 Parkinson's disease: Secondary | ICD-10-CM | POA: Diagnosis not present

## 2017-05-28 DIAGNOSIS — G3183 Dementia with Lewy bodies: Secondary | ICD-10-CM | POA: Diagnosis not present

## 2017-05-30 DIAGNOSIS — R05 Cough: Secondary | ICD-10-CM | POA: Diagnosis not present

## 2017-06-02 DIAGNOSIS — G20 Parkinson's disease: Secondary | ICD-10-CM | POA: Diagnosis not present

## 2017-06-02 DIAGNOSIS — R569 Unspecified convulsions: Secondary | ICD-10-CM | POA: Diagnosis not present

## 2017-06-02 DIAGNOSIS — G40909 Epilepsy, unspecified, not intractable, without status epilepticus: Secondary | ICD-10-CM | POA: Diagnosis not present

## 2017-06-02 DIAGNOSIS — H109 Unspecified conjunctivitis: Secondary | ICD-10-CM | POA: Diagnosis not present

## 2017-06-02 DIAGNOSIS — Z9181 History of falling: Secondary | ICD-10-CM | POA: Diagnosis not present

## 2017-06-16 DIAGNOSIS — G20 Parkinson's disease: Secondary | ICD-10-CM | POA: Diagnosis not present

## 2017-06-16 DIAGNOSIS — H109 Unspecified conjunctivitis: Secondary | ICD-10-CM | POA: Diagnosis not present

## 2017-06-16 DIAGNOSIS — Z9181 History of falling: Secondary | ICD-10-CM | POA: Diagnosis not present

## 2017-06-16 DIAGNOSIS — R569 Unspecified convulsions: Secondary | ICD-10-CM | POA: Diagnosis not present

## 2017-06-16 DIAGNOSIS — G40909 Epilepsy, unspecified, not intractable, without status epilepticus: Secondary | ICD-10-CM | POA: Diagnosis not present

## 2017-06-23 DIAGNOSIS — F0280 Dementia in other diseases classified elsewhere without behavioral disturbance: Secondary | ICD-10-CM | POA: Diagnosis not present

## 2017-06-23 DIAGNOSIS — J069 Acute upper respiratory infection, unspecified: Secondary | ICD-10-CM | POA: Diagnosis not present

## 2017-06-23 DIAGNOSIS — F418 Other specified anxiety disorders: Secondary | ICD-10-CM | POA: Diagnosis not present

## 2017-06-23 DIAGNOSIS — L219 Seborrheic dermatitis, unspecified: Secondary | ICD-10-CM | POA: Diagnosis not present

## 2017-06-23 DIAGNOSIS — R45851 Suicidal ideations: Secondary | ICD-10-CM | POA: Diagnosis not present

## 2017-06-23 DIAGNOSIS — G40909 Epilepsy, unspecified, not intractable, without status epilepticus: Secondary | ICD-10-CM | POA: Diagnosis not present

## 2017-06-23 DIAGNOSIS — R404 Transient alteration of awareness: Secondary | ICD-10-CM | POA: Diagnosis not present

## 2017-06-23 DIAGNOSIS — G20 Parkinson's disease: Secondary | ICD-10-CM | POA: Diagnosis not present

## 2017-06-29 DIAGNOSIS — L219 Seborrheic dermatitis, unspecified: Secondary | ICD-10-CM | POA: Diagnosis not present

## 2017-06-29 DIAGNOSIS — G40909 Epilepsy, unspecified, not intractable, without status epilepticus: Secondary | ICD-10-CM | POA: Diagnosis not present

## 2017-06-29 DIAGNOSIS — R45851 Suicidal ideations: Secondary | ICD-10-CM | POA: Diagnosis not present

## 2017-06-29 DIAGNOSIS — J069 Acute upper respiratory infection, unspecified: Secondary | ICD-10-CM | POA: Diagnosis not present

## 2017-06-29 DIAGNOSIS — G20 Parkinson's disease: Secondary | ICD-10-CM | POA: Diagnosis not present

## 2017-06-29 DIAGNOSIS — F418 Other specified anxiety disorders: Secondary | ICD-10-CM | POA: Diagnosis not present

## 2017-06-29 DIAGNOSIS — R404 Transient alteration of awareness: Secondary | ICD-10-CM | POA: Diagnosis not present

## 2017-06-29 DIAGNOSIS — F0280 Dementia in other diseases classified elsewhere without behavioral disturbance: Secondary | ICD-10-CM | POA: Diagnosis not present

## 2017-06-30 DIAGNOSIS — L219 Seborrheic dermatitis, unspecified: Secondary | ICD-10-CM | POA: Diagnosis not present

## 2017-06-30 DIAGNOSIS — F418 Other specified anxiety disorders: Secondary | ICD-10-CM | POA: Diagnosis not present

## 2017-06-30 DIAGNOSIS — J069 Acute upper respiratory infection, unspecified: Secondary | ICD-10-CM | POA: Diagnosis not present

## 2017-06-30 DIAGNOSIS — R404 Transient alteration of awareness: Secondary | ICD-10-CM | POA: Diagnosis not present

## 2017-06-30 DIAGNOSIS — F0280 Dementia in other diseases classified elsewhere without behavioral disturbance: Secondary | ICD-10-CM | POA: Diagnosis not present

## 2017-06-30 DIAGNOSIS — R45851 Suicidal ideations: Secondary | ICD-10-CM | POA: Diagnosis not present

## 2017-06-30 DIAGNOSIS — G40909 Epilepsy, unspecified, not intractable, without status epilepticus: Secondary | ICD-10-CM | POA: Diagnosis not present

## 2017-06-30 DIAGNOSIS — G20 Parkinson's disease: Secondary | ICD-10-CM | POA: Diagnosis not present

## 2017-07-05 DIAGNOSIS — F331 Major depressive disorder, recurrent, moderate: Secondary | ICD-10-CM | POA: Diagnosis not present

## 2017-07-05 DIAGNOSIS — G20 Parkinson's disease: Secondary | ICD-10-CM | POA: Diagnosis not present

## 2017-07-05 DIAGNOSIS — G3183 Dementia with Lewy bodies: Secondary | ICD-10-CM | POA: Diagnosis not present

## 2017-07-07 DIAGNOSIS — G40909 Epilepsy, unspecified, not intractable, without status epilepticus: Secondary | ICD-10-CM | POA: Diagnosis not present

## 2017-07-07 DIAGNOSIS — R45851 Suicidal ideations: Secondary | ICD-10-CM | POA: Diagnosis not present

## 2017-07-07 DIAGNOSIS — G20 Parkinson's disease: Secondary | ICD-10-CM | POA: Diagnosis not present

## 2017-07-07 DIAGNOSIS — F418 Other specified anxiety disorders: Secondary | ICD-10-CM | POA: Diagnosis not present

## 2017-07-07 DIAGNOSIS — J069 Acute upper respiratory infection, unspecified: Secondary | ICD-10-CM | POA: Diagnosis not present

## 2017-07-07 DIAGNOSIS — R404 Transient alteration of awareness: Secondary | ICD-10-CM | POA: Diagnosis not present

## 2017-07-07 DIAGNOSIS — L219 Seborrheic dermatitis, unspecified: Secondary | ICD-10-CM | POA: Diagnosis not present

## 2017-07-07 DIAGNOSIS — F0280 Dementia in other diseases classified elsewhere without behavioral disturbance: Secondary | ICD-10-CM | POA: Diagnosis not present

## 2017-07-17 ENCOUNTER — Other Ambulatory Visit
Admission: RE | Admit: 2017-07-17 | Discharge: 2017-07-17 | Disposition: A | Discharge status: Home / Self Care | Payer: PPO | Source: Skilled Nursing Facility | Attending: Family Medicine | Admitting: Family Medicine

## 2017-07-17 DIAGNOSIS — R569 Unspecified convulsions: Secondary | ICD-10-CM | POA: Insufficient documentation

## 2017-07-17 LAB — URINALYSIS, COMPLETE (UACMP) WITH MICROSCOPIC
BACTERIA UA: NONE SEEN
BILIRUBIN URINE: NEGATIVE
Glucose, UA: NEGATIVE mg/dL
HGB URINE DIPSTICK: NEGATIVE
KETONES UR: 5 mg/dL — AB
LEUKOCYTES UA: NEGATIVE
Nitrite: NEGATIVE
PROTEIN: NEGATIVE mg/dL
SPECIFIC GRAVITY, URINE: 1.019 (ref 1.005–1.030)
pH: 5 (ref 5.0–8.0)

## 2017-07-18 LAB — URINE CULTURE: Culture: NO GROWTH

## 2017-07-20 DIAGNOSIS — L219 Seborrheic dermatitis, unspecified: Secondary | ICD-10-CM | POA: Diagnosis not present

## 2017-07-20 DIAGNOSIS — F0280 Dementia in other diseases classified elsewhere without behavioral disturbance: Secondary | ICD-10-CM | POA: Diagnosis not present

## 2017-07-20 DIAGNOSIS — J069 Acute upper respiratory infection, unspecified: Secondary | ICD-10-CM | POA: Diagnosis not present

## 2017-07-20 DIAGNOSIS — G20 Parkinson's disease: Secondary | ICD-10-CM | POA: Diagnosis not present

## 2017-07-20 DIAGNOSIS — F418 Other specified anxiety disorders: Secondary | ICD-10-CM | POA: Diagnosis not present

## 2017-07-20 DIAGNOSIS — R404 Transient alteration of awareness: Secondary | ICD-10-CM | POA: Diagnosis not present

## 2017-07-20 DIAGNOSIS — R45851 Suicidal ideations: Secondary | ICD-10-CM | POA: Diagnosis not present

## 2017-07-20 DIAGNOSIS — G40909 Epilepsy, unspecified, not intractable, without status epilepticus: Secondary | ICD-10-CM | POA: Diagnosis not present

## 2017-07-28 DIAGNOSIS — F0280 Dementia in other diseases classified elsewhere without behavioral disturbance: Secondary | ICD-10-CM | POA: Diagnosis not present

## 2017-07-28 DIAGNOSIS — F418 Other specified anxiety disorders: Secondary | ICD-10-CM | POA: Diagnosis not present

## 2017-07-28 DIAGNOSIS — G20 Parkinson's disease: Secondary | ICD-10-CM | POA: Diagnosis not present

## 2017-07-28 DIAGNOSIS — G40909 Epilepsy, unspecified, not intractable, without status epilepticus: Secondary | ICD-10-CM | POA: Diagnosis not present

## 2017-08-03 DIAGNOSIS — R110 Nausea: Secondary | ICD-10-CM | POA: Diagnosis not present

## 2017-08-03 DIAGNOSIS — R600 Localized edema: Secondary | ICD-10-CM | POA: Diagnosis not present

## 2017-08-18 DIAGNOSIS — L738 Other specified follicular disorders: Secondary | ICD-10-CM | POA: Diagnosis not present

## 2017-08-18 DIAGNOSIS — D239 Other benign neoplasm of skin, unspecified: Secondary | ICD-10-CM | POA: Diagnosis not present

## 2017-08-31 DIAGNOSIS — F331 Major depressive disorder, recurrent, moderate: Secondary | ICD-10-CM | POA: Diagnosis not present

## 2017-08-31 DIAGNOSIS — G20 Parkinson's disease: Secondary | ICD-10-CM | POA: Diagnosis not present

## 2017-08-31 DIAGNOSIS — G3183 Dementia with Lewy bodies: Secondary | ICD-10-CM | POA: Diagnosis not present

## 2017-09-14 DIAGNOSIS — F331 Major depressive disorder, recurrent, moderate: Secondary | ICD-10-CM | POA: Diagnosis not present

## 2017-09-14 DIAGNOSIS — G20 Parkinson's disease: Secondary | ICD-10-CM | POA: Diagnosis not present

## 2017-09-21 DIAGNOSIS — L738 Other specified follicular disorders: Secondary | ICD-10-CM | POA: Diagnosis not present

## 2017-09-21 DIAGNOSIS — F418 Other specified anxiety disorders: Secondary | ICD-10-CM | POA: Diagnosis not present

## 2017-09-21 DIAGNOSIS — G20 Parkinson's disease: Secondary | ICD-10-CM | POA: Diagnosis not present

## 2017-09-21 DIAGNOSIS — R569 Unspecified convulsions: Secondary | ICD-10-CM | POA: Diagnosis not present

## 2017-09-21 DIAGNOSIS — L219 Seborrheic dermatitis, unspecified: Secondary | ICD-10-CM | POA: Diagnosis not present

## 2017-09-21 DIAGNOSIS — K625 Hemorrhage of anus and rectum: Secondary | ICD-10-CM | POA: Diagnosis not present

## 2017-09-21 DIAGNOSIS — L739 Follicular disorder, unspecified: Secondary | ICD-10-CM | POA: Diagnosis not present

## 2017-09-21 DIAGNOSIS — F331 Major depressive disorder, recurrent, moderate: Secondary | ICD-10-CM | POA: Diagnosis not present

## 2017-09-21 DIAGNOSIS — K5900 Constipation, unspecified: Secondary | ICD-10-CM | POA: Diagnosis not present

## 2017-09-22 DIAGNOSIS — G20 Parkinson's disease: Secondary | ICD-10-CM | POA: Diagnosis not present

## 2017-09-22 DIAGNOSIS — F331 Major depressive disorder, recurrent, moderate: Secondary | ICD-10-CM | POA: Diagnosis not present

## 2017-09-22 DIAGNOSIS — G3183 Dementia with Lewy bodies: Secondary | ICD-10-CM | POA: Diagnosis not present

## 2017-09-27 DIAGNOSIS — L218 Other seborrheic dermatitis: Secondary | ICD-10-CM | POA: Diagnosis not present

## 2017-09-27 DIAGNOSIS — L738 Other specified follicular disorders: Secondary | ICD-10-CM | POA: Diagnosis not present

## 2017-09-30 DIAGNOSIS — L738 Other specified follicular disorders: Secondary | ICD-10-CM | POA: Diagnosis not present

## 2017-09-30 DIAGNOSIS — L739 Follicular disorder, unspecified: Secondary | ICD-10-CM | POA: Diagnosis not present

## 2017-09-30 DIAGNOSIS — R569 Unspecified convulsions: Secondary | ICD-10-CM | POA: Diagnosis not present

## 2017-09-30 DIAGNOSIS — L219 Seborrheic dermatitis, unspecified: Secondary | ICD-10-CM | POA: Diagnosis not present

## 2017-09-30 DIAGNOSIS — F418 Other specified anxiety disorders: Secondary | ICD-10-CM | POA: Diagnosis not present

## 2017-09-30 DIAGNOSIS — G20 Parkinson's disease: Secondary | ICD-10-CM | POA: Diagnosis not present

## 2017-09-30 DIAGNOSIS — K5900 Constipation, unspecified: Secondary | ICD-10-CM | POA: Diagnosis not present

## 2017-09-30 DIAGNOSIS — K625 Hemorrhage of anus and rectum: Secondary | ICD-10-CM | POA: Diagnosis not present

## 2017-09-30 DIAGNOSIS — F331 Major depressive disorder, recurrent, moderate: Secondary | ICD-10-CM | POA: Diagnosis not present

## 2017-10-01 DIAGNOSIS — G20 Parkinson's disease: Secondary | ICD-10-CM | POA: Diagnosis not present

## 2017-10-01 DIAGNOSIS — R278 Other lack of coordination: Secondary | ICD-10-CM | POA: Diagnosis not present

## 2017-10-04 DIAGNOSIS — G20 Parkinson's disease: Secondary | ICD-10-CM | POA: Diagnosis not present

## 2017-10-04 DIAGNOSIS — F331 Major depressive disorder, recurrent, moderate: Secondary | ICD-10-CM | POA: Diagnosis not present

## 2017-10-11 DIAGNOSIS — G20 Parkinson's disease: Secondary | ICD-10-CM | POA: Diagnosis not present

## 2017-10-11 DIAGNOSIS — F331 Major depressive disorder, recurrent, moderate: Secondary | ICD-10-CM | POA: Diagnosis not present

## 2017-10-14 DIAGNOSIS — F0150 Vascular dementia without behavioral disturbance: Secondary | ICD-10-CM | POA: Diagnosis not present

## 2017-10-14 DIAGNOSIS — F0280 Dementia in other diseases classified elsewhere without behavioral disturbance: Secondary | ICD-10-CM | POA: Diagnosis not present

## 2017-10-14 DIAGNOSIS — Z8673 Personal history of transient ischemic attack (TIA), and cerebral infarction without residual deficits: Secondary | ICD-10-CM | POA: Diagnosis not present

## 2017-10-14 DIAGNOSIS — G20 Parkinson's disease: Secondary | ICD-10-CM | POA: Diagnosis not present

## 2017-10-14 DIAGNOSIS — M5441 Lumbago with sciatica, right side: Secondary | ICD-10-CM | POA: Diagnosis not present

## 2017-10-14 DIAGNOSIS — R569 Unspecified convulsions: Secondary | ICD-10-CM | POA: Diagnosis not present

## 2017-10-14 DIAGNOSIS — G309 Alzheimer's disease, unspecified: Secondary | ICD-10-CM | POA: Diagnosis not present

## 2017-10-14 DIAGNOSIS — R5382 Chronic fatigue, unspecified: Secondary | ICD-10-CM | POA: Diagnosis not present

## 2017-10-14 DIAGNOSIS — G8929 Other chronic pain: Secondary | ICD-10-CM | POA: Diagnosis not present

## 2017-10-14 DIAGNOSIS — F4321 Adjustment disorder with depressed mood: Secondary | ICD-10-CM | POA: Diagnosis not present

## 2017-10-19 DIAGNOSIS — G20 Parkinson's disease: Secondary | ICD-10-CM | POA: Diagnosis not present

## 2017-10-19 DIAGNOSIS — L738 Other specified follicular disorders: Secondary | ICD-10-CM | POA: Diagnosis not present

## 2017-10-19 DIAGNOSIS — L219 Seborrheic dermatitis, unspecified: Secondary | ICD-10-CM | POA: Diagnosis not present

## 2017-10-19 DIAGNOSIS — F418 Other specified anxiety disorders: Secondary | ICD-10-CM | POA: Diagnosis not present

## 2017-10-19 DIAGNOSIS — K625 Hemorrhage of anus and rectum: Secondary | ICD-10-CM | POA: Diagnosis not present

## 2017-10-19 DIAGNOSIS — K5900 Constipation, unspecified: Secondary | ICD-10-CM | POA: Diagnosis not present

## 2017-10-19 DIAGNOSIS — R569 Unspecified convulsions: Secondary | ICD-10-CM | POA: Diagnosis not present

## 2017-10-19 DIAGNOSIS — L739 Follicular disorder, unspecified: Secondary | ICD-10-CM | POA: Diagnosis not present

## 2017-10-21 DIAGNOSIS — G20 Parkinson's disease: Secondary | ICD-10-CM | POA: Diagnosis not present

## 2017-10-21 DIAGNOSIS — F331 Major depressive disorder, recurrent, moderate: Secondary | ICD-10-CM | POA: Diagnosis not present

## 2017-10-21 DIAGNOSIS — G3183 Dementia with Lewy bodies: Secondary | ICD-10-CM | POA: Diagnosis not present

## 2017-10-25 DIAGNOSIS — G20 Parkinson's disease: Secondary | ICD-10-CM | POA: Diagnosis not present

## 2017-10-25 DIAGNOSIS — F331 Major depressive disorder, recurrent, moderate: Secondary | ICD-10-CM | POA: Diagnosis not present

## 2017-10-26 DIAGNOSIS — K5900 Constipation, unspecified: Secondary | ICD-10-CM | POA: Diagnosis not present

## 2017-10-26 DIAGNOSIS — R1084 Generalized abdominal pain: Secondary | ICD-10-CM | POA: Diagnosis not present

## 2017-10-26 DIAGNOSIS — K921 Melena: Secondary | ICD-10-CM | POA: Diagnosis not present

## 2017-10-26 DIAGNOSIS — K5909 Other constipation: Secondary | ICD-10-CM | POA: Diagnosis not present

## 2017-10-27 DIAGNOSIS — E785 Hyperlipidemia, unspecified: Secondary | ICD-10-CM | POA: Diagnosis not present

## 2017-10-27 DIAGNOSIS — N189 Chronic kidney disease, unspecified: Secondary | ICD-10-CM | POA: Diagnosis not present

## 2017-10-27 DIAGNOSIS — D649 Anemia, unspecified: Secondary | ICD-10-CM | POA: Diagnosis not present

## 2017-11-01 DIAGNOSIS — F331 Major depressive disorder, recurrent, moderate: Secondary | ICD-10-CM | POA: Diagnosis not present

## 2017-11-01 DIAGNOSIS — G20 Parkinson's disease: Secondary | ICD-10-CM | POA: Diagnosis not present

## 2017-11-08 DIAGNOSIS — G20 Parkinson's disease: Secondary | ICD-10-CM | POA: Diagnosis not present

## 2017-11-08 DIAGNOSIS — F331 Major depressive disorder, recurrent, moderate: Secondary | ICD-10-CM | POA: Diagnosis not present

## 2017-11-15 ENCOUNTER — Emergency Department: Payer: PPO

## 2017-11-15 ENCOUNTER — Emergency Department
Admission: EM | Admit: 2017-11-15 | Discharge: 2017-11-15 | Disposition: A | Discharge status: Skilled Nursing Facility | Payer: PPO | Attending: Emergency Medicine | Admitting: Emergency Medicine

## 2017-11-15 ENCOUNTER — Other Ambulatory Visit: Payer: Self-pay

## 2017-11-15 DIAGNOSIS — Z7401 Bed confinement status: Secondary | ICD-10-CM | POA: Diagnosis not present

## 2017-11-15 DIAGNOSIS — Z87891 Personal history of nicotine dependence: Secondary | ICD-10-CM | POA: Diagnosis not present

## 2017-11-15 DIAGNOSIS — K449 Diaphragmatic hernia without obstruction or gangrene: Secondary | ICD-10-CM | POA: Diagnosis not present

## 2017-11-15 DIAGNOSIS — R4182 Altered mental status, unspecified: Secondary | ICD-10-CM | POA: Diagnosis not present

## 2017-11-15 DIAGNOSIS — E039 Hypothyroidism, unspecified: Secondary | ICD-10-CM | POA: Insufficient documentation

## 2017-11-15 DIAGNOSIS — K5900 Constipation, unspecified: Secondary | ICD-10-CM | POA: Diagnosis not present

## 2017-11-15 DIAGNOSIS — G20 Parkinson's disease: Secondary | ICD-10-CM | POA: Diagnosis not present

## 2017-11-15 DIAGNOSIS — R109 Unspecified abdominal pain: Secondary | ICD-10-CM | POA: Diagnosis present

## 2017-11-15 DIAGNOSIS — F0280 Dementia in other diseases classified elsewhere without behavioral disturbance: Secondary | ICD-10-CM | POA: Diagnosis not present

## 2017-11-15 DIAGNOSIS — Z7982 Long term (current) use of aspirin: Secondary | ICD-10-CM | POA: Insufficient documentation

## 2017-11-15 DIAGNOSIS — I10 Essential (primary) hypertension: Secondary | ICD-10-CM | POA: Insufficient documentation

## 2017-11-15 DIAGNOSIS — Z79899 Other long term (current) drug therapy: Secondary | ICD-10-CM | POA: Diagnosis not present

## 2017-11-15 DIAGNOSIS — F331 Major depressive disorder, recurrent, moderate: Secondary | ICD-10-CM | POA: Diagnosis not present

## 2017-11-15 LAB — COMPREHENSIVE METABOLIC PANEL
ALT: <5 U/L (ref 0–44)
AST: 20 U/L (ref 15–41)
Albumin: 3.6 g/dL (ref 3.5–5.0)
Alkaline Phosphatase: 48 U/L (ref 38–126)
Anion gap: 5 (ref 5–15)
BILIRUBIN TOTAL: 0.6 mg/dL (ref 0.3–1.2)
BUN: 18 mg/dL (ref 8–23)
CO2: 30 mmol/L (ref 22–32)
Calcium: 9.1 mg/dL (ref 8.9–10.3)
Chloride: 106 mmol/L (ref 98–111)
Creatinine, Ser: 0.9 mg/dL (ref 0.61–1.24)
GFR calc Af Amer: >60 mL/min (ref >60)
Glucose, Bld: 106 mg/dL — ABNORMAL HIGH (ref 70–99)
POTASSIUM: 3.8 mmol/L (ref 3.5–5.1)
Sodium: 141 mmol/L (ref 135–145)
TOTAL PROTEIN: 6.6 g/dL (ref 6.5–8.1)

## 2017-11-15 LAB — CBC WITH DIFFERENTIAL/PLATELET
BASOS PCT: 1 %
Basophils Absolute: 0 10*3/uL (ref 0–0.1)
Eosinophils Absolute: 0.2 10*3/uL (ref 0–0.7)
Eosinophils Relative: 3 %
HEMATOCRIT: 36.2 % — AB (ref 40.0–52.0)
Hemoglobin: 12.5 g/dL — ABNORMAL LOW (ref 13.0–18.0)
Lymphocytes Relative: 27 %
Lymphs Abs: 1.5 10*3/uL (ref 1.0–3.6)
MCH: 31.1 pg (ref 26.0–34.0)
MCHC: 34.5 g/dL (ref 32.0–36.0)
MCV: 90 fL (ref 80.0–100.0)
MONOS PCT: 7 %
Monocytes Absolute: 0.4 10*3/uL (ref 0.2–1.0)
NEUTROS ABS: 3.6 10*3/uL (ref 1.4–6.5)
Neutrophils Relative %: 62 %
Platelets: 124 10*3/uL — ABNORMAL LOW (ref 150–440)
RBC: 4.03 MIL/uL — ABNORMAL LOW (ref 4.40–5.90)
RDW: 14.9 % — AB (ref 11.5–14.5)
WBC: 5.7 10*3/uL (ref 3.8–10.6)

## 2017-11-15 LAB — LIPASE, BLOOD: LIPASE: 20 U/L (ref 11–51)

## 2017-11-15 MED ORDER — SENNA 8.6 MG PO TABS
2.0000 | ORAL_TABLET | ORAL | Status: AC
  Administered 2017-11-15: 17.2 mg via ORAL
  Filled 2017-11-15: qty 2

## 2017-11-15 MED ORDER — SENNA 8.6 MG PO TABS: 2.0000 | ORAL_TABLET | Freq: Two times a day (BID) | ORAL | 0 refills | Status: AC

## 2017-11-15 MED ORDER — METOCLOPRAMIDE HCL 5 MG/ML IJ SOLN
5.0000 mg | Freq: Once | INTRAMUSCULAR | Status: AC
  Administered 2017-11-15: 5 mg via INTRAVENOUS
  Filled 2017-11-15: qty 2

## 2017-11-15 MED ORDER — LABETALOL HCL 5 MG/ML IV SOLN
INTRAVENOUS | Status: AC
  Administered 2017-11-15: 10 mg
  Filled 2017-11-15: qty 4

## 2017-11-15 MED ORDER — IOPAMIDOL (ISOVUE-300) INJECTION 61%
100.0000 mL | Freq: Once | INTRAVENOUS | Status: AC | PRN
  Administered 2017-11-15: 100 mL via INTRAVENOUS

## 2017-11-15 NOTE — ED Notes (Signed)
[facility]  CALLED  FOR  TRANSPORT 

## 2017-11-15 NOTE — ED Notes (Signed)
Wife states she is unable to transport pt to [facility] due to size of vehicle.

## 2017-11-15 NOTE — ED Provider Notes (Signed)
[facility] Emergency Department Provider Note  ____________________________________________  Time seen: Approximately 3:58 PM  I have reviewed the triage vital signs and the nursing notes.   HISTORY  Chief Complaint Abdominal Pain    HPI Lawrence KELEMEN Sr. is a 84 y.o. male with a history of dementia and hypertension and depression who comes to the ED today complaining of constipation.  Denies abdominal pain or vomiting.  Eating normally.   He states that his abdomen feels "numb".  Last bowel movement was about 4 days ago.  He has been seen in GI clinic previously for his constipation issues, and was increased to twice daily docusate and twice daily MiraLAX.  Symptoms are constant without aggravating or alleviating factors over the past several days.     Past Medical History:  Diagnosis Date  . Arthritis   . Bradycardia   . Chronic kidney disease    enlarged prostrate  . Depression   . Dysrhythmia   . Hypertension   . Hypothyroid   . Hypothyroidism   . Pacemaker   . Parkinson disease ([place])   . Vertigo      Patient Active Problem List   Diagnosis Date Noted  . At risk for falls 09/29/2016  . 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 Seizure disorder ([place]) 05/05/2016  . Chronic bilateral low back pain without sciatica 05/05/2016  . Hx of thrombocytopenia 11/07/2015  . Normocytic anemia 07/17/2015  . History of sick sinus syndrome 04/16/2015  . Chest wall pain 11/29/2014  . Hypertension 10/11/2014  . Hyperlipidemia 10/11/2014  . Idiopathic Parkinson's disease ([place]) 05/02/2014  . Leg pain, right 05/02/2014  . Back pain, chronic 12/28/2013  . Clinical depression 08/29/2013  . Has a tremor 08/29/2013  . Mechanical and motor problems with internal organs 08/29/2013  . Disordered sleep 08/29/2013  . Amnesia 08/29/2013  . Fatigue 08/29/2013  . Fall 08/29/2013  . Dizziness 08/29/2013  . Difficulty in walking 08/29/2013  .  Recurrent major depressive disorder, in partial remission ([place]) 08/29/2013  . Adult hypothyroidism 06/16/2013  . Acquired trigger finger 06/16/2013  . Arrhythmia, sinus node 06/16/2013  . HLD (hyperlipidemia) 06/16/2013  . Benign essential HTN 06/16/2013  . Pure hypercholesterolemia 06/16/2013     Past Surgical History:  Procedure Laterality Date  . APPENDECTOMY    . CARDIAC CATHETERIZATION     09  . CATARACT EXTRACTION    . HERNIA REPAIR  20 years ago   umbiluical   . HERNIA REPAIR Left 07-25-13   inguinal  . INSERT / REPLACE / REMOVE PACEMAKER     dr Ilona Sorrel     09  . JOINT REPLACEMENT     lt knee  . JOINT REPLACEMENT     right hip  . KNEE SURGERY Left   . LUMBAR LAMINECTOMY/DECOMPRESSION MICRODISCECTOMY  08/06/2011   Procedure: LUMBAR LAMINECTOMY/DECOMPRESSION MICRODISCECTOMY 2 LEVELS;  Surgeon: Eustace Moore, MD;  Location: [place] [facility];  Service: Neurosurgery;  Laterality: N/A;  Lumbar two three, lumbar three four decompressive laminectomy  . PARTIAL HIP ARTHROPLASTY Right 2014  . SHOULDER SURGERY Right   . thumbs     trigger thumbs     Prior to Admission medications   Medication Sig Start Date End Date Taking? Authorizing Provider  aspirin EC 81 MG tablet Take 81 mg by mouth daily.   Yes [provider]  bisacodyl (DULCOLAX) 5 MG EC tablet Take 1 tablet (5 mg total) by mouth daily as  needed for moderate constipation. Patient taking differently: Take 5 mg by mouth daily.  03/12/17  Yes Lavonia Drafts, MD  carbidopa-levodopa (SINEMET CR) 50-200 MG tablet Take 1 tablet by mouth 3 (three) times daily. 03/12/17  Yes Lavonia Drafts, MD  citalopram (CELEXA) 20 MG tablet Take 1 tablet (20 mg total) by mouth daily. 03/12/17  Yes Lavonia Drafts, MD  docusate sodium (COLACE) 100 MG capsule Take 100 mg by mouth 2 (two) times daily.   Yes [provider]  finasteride (PROSCAR) 5 MG tablet Take 1 tablet (5 mg total) by mouth daily. 03/12/17  Yes Lavonia Drafts, MD  fluticasone (FLONASE) 50 MCG/ACT nasal spray SHAKE LIQUID AND USE 2 SPRAYS IN EACH NOSTRIL DAILY Patient taking differently: Place 2 sprays into both nostrils daily.  11/29/15  Yes Roselee Nova, MD  gabapentin (NEURONTIN) 100 MG capsule Take 200 mg by mouth at bedtime.   Yes [provider]  lamoTRIgine (LAMICTAL) 200 MG tablet Take 200 mg by mouth 2 (two) times daily.   Yes [provider]  levothyroxine (SYNTHROID, LEVOTHROID) 88 MCG tablet Take 88 mcg by mouth daily before breakfast.   Yes [provider]  lisinopril (PRINIVIL,ZESTRIL) 10 MG tablet Take 1 tablet (10 mg total) by mouth daily. 03/12/17  Yes Lavonia Drafts, MD  Melatonin 5 MG TABS Take 1 tablet by mouth at bedtime. Reported on 03/21/2015   Yes [provider]  Multiple Vitamin (MULITIVITAMIN WITH MINERALS) TABS Take 1 tablet by mouth daily.   Yes [provider]  polyethylene glycol (MIRALAX / GLYCOLAX) packet Take 17 g by mouth 2 (two) times daily.   Yes [provider]  pravastatin (PRAVACHOL) 20 MG tablet Take 1 tablet (20 mg total) by mouth daily. 03/12/17  Yes Lavonia Drafts, MD  risperiDONE (RISPERDAL) 0.5 MG tablet Take 0.5 mg by mouth every morning.   Yes [provider]  risperiDONE (RISPERDAL) 2 MG tablet Take 1 tablet (2 mg total) by mouth at bedtime. 03/12/17  Yes Lavonia Drafts, MD  traMADol (ULTRAM) 50 MG tablet Take 1 tablet (50 mg total) by mouth 3 (three) times daily as needed for moderate pain. Patient taking differently: Take 50 mg by mouth every 8 (eight) hours as needed for moderate pain.  03/12/17  Yes Harvest Dark, MD  loratadine (CLARITIN) 10 MG tablet Take 1 tablet (10 mg total) by mouth daily. Patient not taking: Reported on 09/08/2016 07/08/15   Roselee Nova, MD  senna (SENOKOT) 8.6 MG TABS tablet Take 2 tablets (17.2 mg total) by mouth 2 (two) times daily. 11/15/17   Carrie Mew, MD     Allergies No known  allergies and Adhesive [tape]   Family History  Problem Relation Age of Onset  . Prostate cancer Brother   . Kidney cancer Neg Hx   . Bladder Cancer Neg Hx     Social History Social History   Tobacco Use  . Smoking status: Former Smoker    Last attempt to quit: 08/04/1961    Years since quitting: 56.3  . Smokeless tobacco: Never Used  Substance Use Topics  . Alcohol use: No    Comment: socially  . Drug use: No    Review of Systems  Constitutional:   No fever or chills.  ENT:   No sore throat. No rhinorrhea. Cardiovascular:   No chest pain or syncope. Respiratory:   No dyspnea or cough. Gastrointestinal:   Negative for abdominal pain, vomiting, positive constipation Musculoskeletal:  Negative for focal pain or swelling All other systems reviewed and are negative except as documented above in ROS and HPI.  ____________________________________________   PHYSICAL EXAM:  VITAL SIGNS: ED Triage Vitals  Enc Vitals Group     BP 11/15/17 1126 (!) 148/123     Pulse Rate 11/15/17 1126 61     Resp 11/15/17 1126 18     Temp 11/15/17 1126 98.3 F (36.8 C)     Temp Source 11/15/17 1126 Oral     SpO2 11/15/17 1126 93 %     Weight 11/15/17 1128 180 lb (81.6 kg)     Height 11/15/17 1128 5\' 9"  (1.753 m)     Head Circumference --      Peak Flow --      Pain Score 11/15/17 1128 5     Pain Loc --      Pain Edu? --      Excl. in [place]? --     Vital signs reviewed, nursing assessments reviewed.   Constitutional:   Alert and oriented. Non-toxic appearance. Eyes:   Conjunctivae are normal. EOMI. PERRL. ENT      Head:   Normocephalic and atraumatic.      Nose:   No congestion/rhinnorhea.       Mouth/Throat:   MMM, no pharyngeal erythema. No peritonsillar mass.       Neck:   No meningismus. Full ROM. Hematological/Lymphatic/Immunilogical:   No cervical lymphadenopathy. Cardiovascular:   RRR. Symmetric bilateral radial and DP pulses.  No murmurs. Cap refill less than 2  seconds. Respiratory:   Normal respiratory effort without tachypnea/retractions. Breath sounds are clear and equal bilaterally. No wheezes/rales/rhonchi. Gastrointestinal:   Soft and nontender. Non distended. There is no CVA tenderness.  No rebound, rigidity, or guarding. Musculoskeletal:   Normal range of motion in all extremities. No joint effusions.  No lower extremity tenderness.  No edema. Neurologic:   Normal speech and language.  Motor grossly intact. No acute focal neurologic deficits are appreciated.  Skin:    Skin is warm, dry and intact. No rash noted.  No petechiae, purpura, or bullae.  ____________________________________________    LABS (pertinent positives/negatives) (all labs ordered are listed, but only abnormal results are displayed) Labs Reviewed  CBC WITH DIFFERENTIAL/PLATELET - Abnormal; Notable for the following components:      Result Value   RBC 4.03 (*)    Hemoglobin 12.5 (*)    HCT 36.2 (*)    RDW 14.9 (*)    Platelets 124 (*)    All other components within normal limits  COMPREHENSIVE METABOLIC PANEL - Abnormal; Notable for the following components:   Glucose, Bld 106 (*)    All other components within normal limits  URINE CULTURE  LIPASE, BLOOD  CBC WITH DIFFERENTIAL/PLATELET  URINALYSIS, COMPLETE (UACMP) WITH MICROSCOPIC   ____________________________________________   EKG    ____________________________________________    RADIOLOGY  Ct Abdomen Pelvis W Contrast  Result Date: 11/15/2017 CLINICAL DATA:  Acute generalized abdominal pain and distention. EXAM: CT ABDOMEN AND PELVIS WITH CONTRAST TECHNIQUE: Multidetector CT imaging of the abdomen and pelvis was performed using the standard protocol following bolus administration of intravenous contrast. CONTRAST:  128mL ISOVUE-300 IOPAMIDOL (ISOVUE-300) INJECTION 61% COMPARISON:  CT scan of April 15, 2013. FINDINGS: Lower chest: Moderate size sliding-type hiatal hernia is noted. Visualized lung  bases are unremarkable. Hepatobiliary: Cholelithiasis is noted without inflammation. Stable hepatic cysts are noted. No biliary dilatation is noted. Pancreas: Unremarkable. No pancreatic ductal dilatation or surrounding inflammatory  changes. Spleen: Normal in size without focal abnormality. Adrenals/Urinary Tract: Adrenal glands and kidneys appear normal. No hydronephrosis or renal obstruction is noted. No renal or ureteral calculi are noted. Bladder diverticulum is seen arising from the left side, with another arising posteriorly and to the right. Bladder is otherwise unremarkable. Stomach/Bowel: Stool is noted throughout the colon. Status post appendectomy. There is no evidence of bowel obstruction or inflammation. Vascular/Lymphatic: Aortic atherosclerosis. No enlarged abdominal or pelvic lymph nodes. Reproductive: Prostate is unremarkable. Other: No abdominal wall hernia or abnormality. No abdominopelvic ascites. Musculoskeletal: Status post right hip arthroplasty. No acute osseous abnormality is noted. IMPRESSION: Moderate size sliding-type hiatal hernia. Cholelithiasis without inflammation. Stool is noted throughout the colon suggesting constipation. No evidence of bowel obstruction or inflammation is noted. Aortic Atherosclerosis (ICD10-I70.0). Electronically Signed   By: Marijo Conception, M.D.   On: 11/15/2017 13:26   Dg Abdomen Acute W/chest  Result Date: 11/15/2017 CLINICAL DATA:  Abdominal pain, distention, and constipation. EXAM: DG ABDOMEN ACUTE W/ 1V CHEST COMPARISON:  Chest radiograph 04/06/2017. Abdominal radiographs 07/06/2016. FINDINGS: A left subclavian approach dual lead pacemaker remains in place. The cardiomediastinal silhouette is grossly unchanged allowing for increased rightward patient rotation on the current examination. Lung volumes are mildly diminished without evidence of airspace consolidation, edema, sizable pleural effusion, or pneumothorax. No intraperitoneal free air is  identified. A large amount of stool is again seen in the proximal colon, similar to the prior study. There is a small amount of stool more distally, and there is increased, mild gaseous distension of the transverse colon. No dilated small bowel loops are seen to suggest obstruction. Right hip arthroplasty is again noted. IMPRESSION: 1. Large amount of stool in the proximal colon. 2. No evidence of bowel obstruction. 3. Low lung volumes without evidence of acute cardiopulmonary process. Electronically Signed   By: Logan Bores M.D.   On: 11/15/2017 12:09    ____________________________________________   PROCEDURES Procedures  ____________________________________________  DIFFERENTIAL DIAGNOSIS   Bowel obstruction, constipation  CLINICAL IMPRESSION / ASSESSMENT AND PLAN / ED COURSE  Pertinent labs & imaging results that were available during my care of the patient were reviewed by me and considered in my medical decision making (see chart for details).    Patient is nontoxic, complains of vague abdominal symptoms and constipation.  Will obtain an x-ray and labs.  Clinical Course as of Nov 15 1556  Mon Nov 15, 2017  1221 Radiology report of abdominal x-ray series is unremarkable without evidence of obstruction or perforation.  However when I reviewed the images there is a large amount of gas asymmetrically in the abdomen.  I will obtain a CT scan of the abdomen and pelvis for further investigation and possibly bowel obstruction or volvulus.   [PS]  1610 CT unremarkable. No volvulus or obstruction. Will tx with enema and plan outpatient GI follow up.   CT ABDOMEN PELVIS W CONTRAST [PS]    Clinical Course User Index [PS] Carrie Mew, MD    ----------------------------------------- 4:02 PM on 11/15/2017 -----------------------------------------  With reassuring CT result, patient was given senna and an enema.  He had a large bowel movement and feels much better and is eager to go  home.  Medically stable at this time.  Doubt AAA biliary disease or pancreatitis.  No evidence of perforation.   ____________________________________________   FINAL CLINICAL IMPRESSION(S) / ED DIAGNOSES    Final diagnoses:  Constipation, unspecified constipation type     ED Discharge Orders  Ordered    senna (SENOKOT) 8.6 MG TABS tablet  2 times daily     11/15/17 1557          Portions of this note were generated with dragon dictation software. Dictation errors may occur despite best attempts at proofreading.    Carrie Mew, MD 11/15/17 [phone]

## 2017-11-15 NOTE — ED Notes (Signed)
Pt cleaned up and dry sheets applied to bed. Pt given soap suds enema at this time. Pt able to sit on bedside commode. Pt given call bell and advised to call when finished. Pt's bed changed and set up for pt.

## 2017-11-15 NOTE — ED Notes (Signed)
Report given to Sherrie RN 

## 2017-11-15 NOTE — ED Triage Notes (Signed)
Pt comes from [facility] with c/o abdominal pain with some numbness per pt. EMS also reports facility stated that pt hasn't been able to have normal BM. When pt does have BM it is a yellow gel substance per facility. Pt is alert. VSS. MD at bedside

## 2017-11-15 NOTE — ED Notes (Signed)
Pt able to have large formed and loose BM following enema. Pt states he feels so much better. MD informed

## 2017-11-15 NOTE — Discharge Instructions (Addendum)
Your labs today were okay, and your CT scan of the abdomen does not show any bowel obstruction.  There is extensive constipation which was improved after an enema in the ED.  Continue taking docusate and MiraLAX twice a day as prescribed by your doctor.  Add senna twice a day as well to help stimulate your bowels.

## 2017-11-15 NOTE — ED Notes (Signed)
Redraw green and lav sent to lab

## 2017-11-17 DIAGNOSIS — K5900 Constipation, unspecified: Secondary | ICD-10-CM | POA: Diagnosis not present

## 2017-11-18 DIAGNOSIS — G20 Parkinson's disease: Secondary | ICD-10-CM | POA: Diagnosis not present

## 2017-11-18 DIAGNOSIS — G3183 Dementia with Lewy bodies: Secondary | ICD-10-CM | POA: Diagnosis not present

## 2017-11-18 DIAGNOSIS — F331 Major depressive disorder, recurrent, moderate: Secondary | ICD-10-CM | POA: Diagnosis not present

## 2017-11-23 DIAGNOSIS — F0280 Dementia in other diseases classified elsewhere without behavioral disturbance: Secondary | ICD-10-CM | POA: Diagnosis not present

## 2017-11-23 DIAGNOSIS — R569 Unspecified convulsions: Secondary | ICD-10-CM | POA: Diagnosis not present

## 2017-11-23 DIAGNOSIS — R05 Cough: Secondary | ICD-10-CM | POA: Diagnosis not present

## 2017-11-23 DIAGNOSIS — F418 Other specified anxiety disorders: Secondary | ICD-10-CM | POA: Diagnosis not present

## 2017-11-23 DIAGNOSIS — G3183 Dementia with Lewy bodies: Secondary | ICD-10-CM | POA: Diagnosis not present

## 2017-11-23 DIAGNOSIS — G20 Parkinson's disease: Secondary | ICD-10-CM | POA: Diagnosis not present

## 2017-11-23 DIAGNOSIS — F331 Major depressive disorder, recurrent, moderate: Secondary | ICD-10-CM | POA: Diagnosis not present

## 2017-11-29 DIAGNOSIS — G20 Parkinson's disease: Secondary | ICD-10-CM | POA: Diagnosis not present

## 2017-11-29 DIAGNOSIS — F331 Major depressive disorder, recurrent, moderate: Secondary | ICD-10-CM | POA: Diagnosis not present

## 2017-12-02 DIAGNOSIS — G3183 Dementia with Lewy bodies: Secondary | ICD-10-CM | POA: Diagnosis not present

## 2017-12-02 DIAGNOSIS — F331 Major depressive disorder, recurrent, moderate: Secondary | ICD-10-CM | POA: Diagnosis not present

## 2017-12-02 DIAGNOSIS — G20 Parkinson's disease: Secondary | ICD-10-CM | POA: Diagnosis not present

## 2017-12-06 DIAGNOSIS — G20 Parkinson's disease: Secondary | ICD-10-CM | POA: Diagnosis not present

## 2017-12-06 DIAGNOSIS — F331 Major depressive disorder, recurrent, moderate: Secondary | ICD-10-CM | POA: Diagnosis not present

## 2017-12-13 DIAGNOSIS — G20 Parkinson's disease: Secondary | ICD-10-CM | POA: Diagnosis not present

## 2017-12-13 DIAGNOSIS — F331 Major depressive disorder, recurrent, moderate: Secondary | ICD-10-CM | POA: Diagnosis not present

## 2017-12-14 DIAGNOSIS — G3183 Dementia with Lewy bodies: Secondary | ICD-10-CM | POA: Diagnosis not present

## 2017-12-14 DIAGNOSIS — G20 Parkinson's disease: Secondary | ICD-10-CM | POA: Diagnosis not present

## 2017-12-14 DIAGNOSIS — F331 Major depressive disorder, recurrent, moderate: Secondary | ICD-10-CM | POA: Diagnosis not present

## 2017-12-20 DIAGNOSIS — F331 Major depressive disorder, recurrent, moderate: Secondary | ICD-10-CM | POA: Diagnosis not present

## 2017-12-20 DIAGNOSIS — G20 Parkinson's disease: Secondary | ICD-10-CM | POA: Diagnosis not present

## 2017-12-27 DIAGNOSIS — F331 Major depressive disorder, recurrent, moderate: Secondary | ICD-10-CM | POA: Diagnosis not present

## 2017-12-27 DIAGNOSIS — L218 Other seborrheic dermatitis: Secondary | ICD-10-CM | POA: Diagnosis not present

## 2017-12-27 DIAGNOSIS — L738 Other specified follicular disorders: Secondary | ICD-10-CM | POA: Diagnosis not present

## 2017-12-27 DIAGNOSIS — G20 Parkinson's disease: Secondary | ICD-10-CM | POA: Diagnosis not present

## 2018-01-03 DIAGNOSIS — F331 Major depressive disorder, recurrent, moderate: Secondary | ICD-10-CM | POA: Diagnosis not present

## 2018-01-03 DIAGNOSIS — G20 Parkinson's disease: Secondary | ICD-10-CM | POA: Diagnosis not present

## 2018-01-10 DIAGNOSIS — G20 Parkinson's disease: Secondary | ICD-10-CM | POA: Diagnosis not present

## 2018-01-10 DIAGNOSIS — F331 Major depressive disorder, recurrent, moderate: Secondary | ICD-10-CM | POA: Diagnosis not present

## 2018-01-14 DIAGNOSIS — G20 Parkinson's disease: Secondary | ICD-10-CM | POA: Diagnosis not present

## 2018-01-14 DIAGNOSIS — F331 Major depressive disorder, recurrent, moderate: Secondary | ICD-10-CM | POA: Diagnosis not present

## 2018-01-14 DIAGNOSIS — G3183 Dementia with Lewy bodies: Secondary | ICD-10-CM | POA: Diagnosis not present

## 2018-01-17 DIAGNOSIS — G20 Parkinson's disease: Secondary | ICD-10-CM | POA: Diagnosis not present

## 2018-01-17 DIAGNOSIS — F331 Major depressive disorder, recurrent, moderate: Secondary | ICD-10-CM | POA: Diagnosis not present

## 2018-01-20 DIAGNOSIS — E039 Hypothyroidism, unspecified: Secondary | ICD-10-CM | POA: Diagnosis not present

## 2018-01-20 DIAGNOSIS — I495 Sick sinus syndrome: Secondary | ICD-10-CM | POA: Diagnosis not present

## 2018-01-20 DIAGNOSIS — G20 Parkinson's disease: Secondary | ICD-10-CM | POA: Diagnosis not present

## 2018-01-20 DIAGNOSIS — G40909 Epilepsy, unspecified, not intractable, without status epilepticus: Secondary | ICD-10-CM | POA: Diagnosis not present

## 2018-01-20 DIAGNOSIS — I10 Essential (primary) hypertension: Secondary | ICD-10-CM | POA: Diagnosis not present

## 2018-01-21 DIAGNOSIS — N189 Chronic kidney disease, unspecified: Secondary | ICD-10-CM | POA: Diagnosis not present

## 2018-01-21 DIAGNOSIS — N390 Urinary tract infection, site not specified: Secondary | ICD-10-CM | POA: Diagnosis not present

## 2018-01-21 DIAGNOSIS — R55 Syncope and collapse: Secondary | ICD-10-CM | POA: Diagnosis not present

## 2018-01-21 DIAGNOSIS — R4182 Altered mental status, unspecified: Secondary | ICD-10-CM | POA: Diagnosis not present

## 2018-01-21 DIAGNOSIS — G20 Parkinson's disease: Secondary | ICD-10-CM | POA: Diagnosis not present

## 2018-01-21 DIAGNOSIS — R0989 Other specified symptoms and signs involving the circulatory and respiratory systems: Secondary | ICD-10-CM | POA: Diagnosis not present

## 2018-01-21 DIAGNOSIS — D649 Anemia, unspecified: Secondary | ICD-10-CM | POA: Diagnosis not present

## 2018-01-21 DIAGNOSIS — R293 Abnormal posture: Secondary | ICD-10-CM | POA: Diagnosis not present

## 2018-01-24 ENCOUNTER — Other Ambulatory Visit: Payer: Self-pay

## 2018-01-24 DIAGNOSIS — G20 Parkinson's disease: Secondary | ICD-10-CM | POA: Diagnosis not present

## 2018-01-24 DIAGNOSIS — F331 Major depressive disorder, recurrent, moderate: Secondary | ICD-10-CM | POA: Diagnosis not present

## 2018-01-24 NOTE — Patient Outreach (Signed)
[facility]) Care Management  01/24/2018  Lawrence CAPILI Sr. 22-Dec-1931 [id number]    TELEPHONE SCREENING Referral date: 01/06/18 Referral source: primary MD  Referral reason: community resources/ social worker assistance Insurance: health team advantage Attempt #1   Telephone call to patient regarding referral. Unable to reach patient. HIPAA compliant voice message left with call back phone number.   PLAN: RNCM will attempt 2nd telephone call to patient within 4 business days. RNCM will send outreach letter.   Quinn Plowman RN,BSN, [place] Telephonic  [phone]

## 2018-01-25 ENCOUNTER — Other Ambulatory Visit: Payer: Self-pay | Admitting: *Deleted

## 2018-01-25 ENCOUNTER — Other Ambulatory Visit: Payer: Self-pay

## 2018-01-25 DIAGNOSIS — F418 Other specified anxiety disorders: Secondary | ICD-10-CM | POA: Diagnosis not present

## 2018-01-25 DIAGNOSIS — M549 Dorsalgia, unspecified: Secondary | ICD-10-CM | POA: Diagnosis not present

## 2018-01-25 DIAGNOSIS — R569 Unspecified convulsions: Secondary | ICD-10-CM | POA: Diagnosis not present

## 2018-01-25 DIAGNOSIS — F0280 Dementia in other diseases classified elsewhere without behavioral disturbance: Secondary | ICD-10-CM | POA: Diagnosis not present

## 2018-01-25 DIAGNOSIS — G3183 Dementia with Lewy bodies: Secondary | ICD-10-CM | POA: Diagnosis not present

## 2018-01-25 NOTE — Patient Outreach (Addendum)
[facility]) Care Management  01/25/2018  Lawrence CROPP Sr. Aug 04, 1931 [id number]  TELEPHONE SCREENING Referral date: 01/06/18 Referral source: primary MD  Referral reason: community resources/ social worker assistance Insurance: health team advantage  Telephone call to patients spouse and designated party release [employer]. HIPAA verified with spouse for patient. Explained reason for call.  Wife states she would like to move patient into another assisted living facility but she back owes $30, 000 to current facility. Wife states patient has Medicare and medicaid.  Wife states she would like to talk with someone about other assisted living arrangements for patient because she is concerned with the care he has been receiving at his current facility.  Wife reports patient has Parkinson's disease. Wife states she feels patient is paying to much money for his prescriptions. She states she does not understand this if patient has Medicaid and [employer].  She would like to learn more about [employer].   Spouse states patient was seeing an in house psychotherapist at the assisted living facility.  She states she recently became aware that the psychotherapist was not an in plan provider and has since discontinued services for patient. RNCM advised spouse to contact patients Health team advantage concierge to request list of participating psychotherapist providers. Spouse verbalized understanding. Marland Kitchen  RNCM discussed and offered [facility] care management services. Spouse verbally agreed.   PLAN: RNCM will refer patient to pharmacist and social worker.   Quinn Plowman RN,BSN,CCM [facility] Telephonic  [phone]

## 2018-01-25 NOTE — Patient Outreach (Signed)
[facility] [facility]) Care Management  01/25/2018  Lawrence STECH Sr. September 08, 1931 [id number]   Patient referred to this social worker by [facility]  telephonic RNCM to proivde patient's spouse with alternative long term care options. Per patient's spouse, patient has been at the [facility] for the last 6 months. Per patient's spouse "patient has his good days and bad days". Patient's spouse states that she has now decided to remain at this facility as she now owes a $31,000.00 bill that will probably follow him to any  facility he may go to. Per patient the  bill was accrued before his Medicaid was approved. Patient's spouse discussed that she is now getting comfortable at this facility and has worked out a routine.Patient has also developed good relationships with the staff.  Patient's spouse discussed some financial difficulties with patients medications and has had to cut back to visits 2 days a week due to gas. This Education officer, museum provided patient's spouse with emotional support and encouraged her to call this social worker if she had any further questions or concerns.  This Education officer, museum to sign off for now.   Sheralyn Boatman [facility] Care Management [phone]

## 2018-01-26 DIAGNOSIS — F0280 Dementia in other diseases classified elsewhere without behavioral disturbance: Secondary | ICD-10-CM | POA: Diagnosis not present

## 2018-01-26 DIAGNOSIS — R569 Unspecified convulsions: Secondary | ICD-10-CM | POA: Diagnosis not present

## 2018-01-26 DIAGNOSIS — G3183 Dementia with Lewy bodies: Secondary | ICD-10-CM | POA: Diagnosis not present

## 2018-01-26 DIAGNOSIS — F418 Other specified anxiety disorders: Secondary | ICD-10-CM | POA: Diagnosis not present

## 2018-01-26 DIAGNOSIS — M549 Dorsalgia, unspecified: Secondary | ICD-10-CM | POA: Diagnosis not present

## 2018-01-31 ENCOUNTER — Other Ambulatory Visit: Payer: Self-pay | Admitting: Pharmacist

## 2018-01-31 NOTE — Patient Outreach (Signed)
[facility] [facility]) Care Management [place]  01/31/2018  Lawrence Valdez Sr. 09-28-1931 [id number]  Reason for referral: medication assistance  Successful call to Lawrence Valdez's spouse, Lawrence Valdez. HIPAA identifiers verified. We discussed that patient is currently in LTAC and usual payments are processed through Medicare A/B.  Patient assistance programs are via Medicare Part D.  Lawrence Valdez states she receives a separate bill for medications at [facility] through [facility] but is not sure how payments are billed.  She is wondering if she should try to switch to [employer] Part D plan.   Call placed to [place] in [place].  Per representative, patient has Medicaid and Medicare Part D and in LTAC therefore all medications are no charge to patient.  Patient does not need to change to any other Medicare Part D program to have this benefit.   Also, since obtaining Medicaid, all medication bills retroactively billed to [facility] and a refund check will be sent to patient's spouse.  3-way call placed to Lawrence Valdez however she cannot talk to [place] at this time.  Representative from [facility] will reach back out to spouse tomorrow to explain billing.    Plan: Will close patient case at this time.  Am happy to assist in the future as needed.   Lawrence Valdez, PharmD, [place] [phone]

## 2018-02-01 DIAGNOSIS — F418 Other specified anxiety disorders: Secondary | ICD-10-CM | POA: Diagnosis not present

## 2018-02-01 DIAGNOSIS — F0280 Dementia in other diseases classified elsewhere without behavioral disturbance: Secondary | ICD-10-CM | POA: Diagnosis not present

## 2018-02-01 DIAGNOSIS — M549 Dorsalgia, unspecified: Secondary | ICD-10-CM | POA: Diagnosis not present

## 2018-02-01 DIAGNOSIS — R569 Unspecified convulsions: Secondary | ICD-10-CM | POA: Diagnosis not present

## 2018-02-01 DIAGNOSIS — G3183 Dementia with Lewy bodies: Secondary | ICD-10-CM | POA: Diagnosis not present

## 2018-02-07 DIAGNOSIS — R634 Abnormal weight loss: Secondary | ICD-10-CM | POA: Diagnosis not present

## 2018-02-08 DIAGNOSIS — F331 Major depressive disorder, recurrent, moderate: Secondary | ICD-10-CM | POA: Diagnosis not present

## 2018-02-08 DIAGNOSIS — G3183 Dementia with Lewy bodies: Secondary | ICD-10-CM | POA: Diagnosis not present

## 2018-02-08 DIAGNOSIS — G20 Parkinson's disease: Secondary | ICD-10-CM | POA: Diagnosis not present

## 2018-02-15 DIAGNOSIS — F0280 Dementia in other diseases classified elsewhere without behavioral disturbance: Secondary | ICD-10-CM | POA: Diagnosis not present

## 2018-02-15 DIAGNOSIS — G8929 Other chronic pain: Secondary | ICD-10-CM | POA: Diagnosis not present

## 2018-02-15 DIAGNOSIS — F0150 Vascular dementia without behavioral disturbance: Secondary | ICD-10-CM | POA: Diagnosis not present

## 2018-02-15 DIAGNOSIS — R569 Unspecified convulsions: Secondary | ICD-10-CM | POA: Diagnosis not present

## 2018-02-15 DIAGNOSIS — G309 Alzheimer's disease, unspecified: Secondary | ICD-10-CM | POA: Diagnosis not present

## 2018-02-15 DIAGNOSIS — Z8673 Personal history of transient ischemic attack (TIA), and cerebral infarction without residual deficits: Secondary | ICD-10-CM | POA: Diagnosis not present

## 2018-02-15 DIAGNOSIS — F3341 Major depressive disorder, recurrent, in partial remission: Secondary | ICD-10-CM | POA: Diagnosis not present

## 2018-02-15 DIAGNOSIS — M5441 Lumbago with sciatica, right side: Secondary | ICD-10-CM | POA: Diagnosis not present

## 2018-02-15 DIAGNOSIS — R5382 Chronic fatigue, unspecified: Secondary | ICD-10-CM | POA: Diagnosis not present

## 2018-02-15 DIAGNOSIS — G20 Parkinson's disease: Secondary | ICD-10-CM | POA: Diagnosis not present

## 2018-02-16 DIAGNOSIS — Z515 Encounter for palliative care: Secondary | ICD-10-CM | POA: Diagnosis not present

## 2018-02-16 DIAGNOSIS — G20 Parkinson's disease: Secondary | ICD-10-CM | POA: Diagnosis not present

## 2018-02-26 ENCOUNTER — Other Ambulatory Visit: Payer: Self-pay

## 2018-02-26 ENCOUNTER — Emergency Department
Admission: EM | Admit: 2018-02-26 | Discharge: 2018-02-26 | Disposition: A | Discharge status: Home / Self Care | Payer: PPO | Attending: Emergency Medicine | Admitting: Emergency Medicine

## 2018-02-26 ENCOUNTER — Encounter: Payer: Self-pay | Admitting: Emergency Medicine

## 2018-02-26 DIAGNOSIS — I10 Essential (primary) hypertension: Secondary | ICD-10-CM | POA: Diagnosis not present

## 2018-02-26 DIAGNOSIS — R4182 Altered mental status, unspecified: Secondary | ICD-10-CM | POA: Insufficient documentation

## 2018-02-26 DIAGNOSIS — Z7982 Long term (current) use of aspirin: Secondary | ICD-10-CM | POA: Insufficient documentation

## 2018-02-26 DIAGNOSIS — Z79899 Other long term (current) drug therapy: Secondary | ICD-10-CM | POA: Insufficient documentation

## 2018-02-26 DIAGNOSIS — R9431 Abnormal electrocardiogram [ECG] [EKG]: Secondary | ICD-10-CM | POA: Diagnosis not present

## 2018-02-26 DIAGNOSIS — R404 Transient alteration of awareness: Secondary | ICD-10-CM | POA: Diagnosis not present

## 2018-02-26 DIAGNOSIS — N390 Urinary tract infection, site not specified: Secondary | ICD-10-CM | POA: Diagnosis not present

## 2018-02-26 DIAGNOSIS — R0689 Other abnormalities of breathing: Secondary | ICD-10-CM | POA: Diagnosis not present

## 2018-02-26 DIAGNOSIS — G20 Parkinson's disease: Secondary | ICD-10-CM | POA: Diagnosis not present

## 2018-02-26 DIAGNOSIS — R279 Unspecified lack of coordination: Secondary | ICD-10-CM | POA: Diagnosis not present

## 2018-02-26 DIAGNOSIS — Z743 Need for continuous supervision: Secondary | ICD-10-CM | POA: Diagnosis not present

## 2018-02-26 DIAGNOSIS — I4891 Unspecified atrial fibrillation: Secondary | ICD-10-CM | POA: Diagnosis not present

## 2018-02-26 DIAGNOSIS — Z87891 Personal history of nicotine dependence: Secondary | ICD-10-CM | POA: Diagnosis not present

## 2018-02-26 DIAGNOSIS — R4020 Unspecified coma: Secondary | ICD-10-CM | POA: Diagnosis not present

## 2018-02-26 LAB — URINALYSIS, COMPLETE (UACMP) WITH MICROSCOPIC
Bilirubin Urine: NEGATIVE
GLUCOSE, UA: NEGATIVE mg/dL
Ketones, ur: NEGATIVE mg/dL
NITRITE: NEGATIVE
PH: 7 (ref 5.0–8.0)
Protein, ur: NEGATIVE mg/dL
Specific Gravity, Urine: 1.006 (ref 1.005–1.030)
Squamous Epithelial / LPF: NONE SEEN (ref 0–5)

## 2018-02-26 LAB — CBC WITH DIFFERENTIAL/PLATELET
Abs Immature Granulocytes: 0.02 10*3/uL (ref 0.00–0.07)
BASOS ABS: 0 10*3/uL (ref 0.0–0.1)
Basophils Relative: 1 %
EOS ABS: 0.3 10*3/uL (ref 0.0–0.5)
EOS PCT: 6 %
HCT: 37.4 % — ABNORMAL LOW (ref 39.0–52.0)
Hemoglobin: 11.9 g/dL — ABNORMAL LOW (ref 13.0–17.0)
IMMATURE GRANULOCYTES: 0 %
LYMPHS PCT: 27 %
Lymphs Abs: 1.4 10*3/uL (ref 0.7–4.0)
MCH: 29.4 pg (ref 26.0–34.0)
MCHC: 31.8 g/dL (ref 30.0–36.0)
MCV: 92.3 fL (ref 80.0–100.0)
Monocytes Absolute: 0.3 10*3/uL (ref 0.1–1.0)
Monocytes Relative: 6 %
NEUTROS PCT: 60 %
NRBC: 0 % (ref 0.0–0.2)
Neutro Abs: 3.1 10*3/uL (ref 1.7–7.7)
Platelets: 145 10*3/uL — ABNORMAL LOW (ref 150–400)
RBC: 4.05 MIL/uL — AB (ref 4.22–5.81)
RDW: 14.4 % (ref 11.5–15.5)
WBC: 5.1 10*3/uL (ref 4.0–10.5)

## 2018-02-26 LAB — BASIC METABOLIC PANEL
ANION GAP: 8 (ref 5–15)
BUN: 20 mg/dL (ref 8–23)
CHLORIDE: 108 mmol/L (ref 98–111)
CO2: 27 mmol/L (ref 22–32)
CREATININE: 0.92 mg/dL (ref 0.61–1.24)
Calcium: 9.1 mg/dL (ref 8.9–10.3)
GFR calc non Af Amer: >60 mL/min (ref >60)
Glucose, Bld: 99 mg/dL (ref 70–99)
POTASSIUM: 4.4 mmol/L (ref 3.5–5.1)
SODIUM: 143 mmol/L (ref 135–145)

## 2018-02-26 LAB — TROPONIN I

## 2018-02-26 MED ORDER — CEPHALEXIN 500 MG PO CAPS: 500.0000 mg | ORAL_CAPSULE | Freq: Three times a day (TID) | ORAL | 0 refills | Status: DC

## 2018-02-26 MED ORDER — SODIUM CHLORIDE 0.9 % IV SOLN
1.0000 g | Freq: Once | INTRAVENOUS | Status: AC
  Administered 2018-02-26: 1 g via INTRAVENOUS
  Filled 2018-02-26: qty 10

## 2018-02-26 NOTE — ED Notes (Signed)
Pt's clothes and blankets sent with pt on ambulance stretcher.

## 2018-02-26 NOTE — ED Notes (Signed)
EMS arrived to transport pt back to facility, Primary RN in with another patient. Pt's belongings given to EMS.  IV already removed prior to departure.

## 2018-02-26 NOTE — ED Triage Notes (Signed)
Pt presents from [facility] via [facility] with c/o altered mental status according to wife and staff at facility. Only responsive to some painful stimuli for ems upon arrival to facility, but became increasingly alert in route. Currently pt alert with eyes open. No complaints of pain, n/v/d. BS 109. hx of a-fib and demand pacemaker

## 2018-02-26 NOTE — ED Provider Notes (Signed)
[facility] Emergency Department Provider Note  ____________________________________________   I have reviewed the triage vital signs and the nursing notes.   HISTORY  Chief Complaint Altered Mental Status   History limited by and level 5 caveat due to: AMS   HPI Lawrence Valdez Sr. is a 84 y.o. male who presents to the emergency department today because of concern for an episode of decreased responsiveness. Apparently staff at nursing home noticed that the patient had decreased responsiveness this morning. When EMS first arrived he was minimally responsive however became more responsive during transport. The patient himself does not know why he is in the emergency department.   Per medical record review patient has a history of parkinson disease, seizure.   Past Medical History:  Diagnosis Date  . Arthritis   . Bradycardia   . Chronic kidney disease    enlarged prostrate  . Depression   . Dysrhythmia   . Hypertension   . Hypothyroid   . Hypothyroidism   . Pacemaker   . Parkinson disease ([place])   . Vertigo     Patient Active Problem List   Diagnosis Date Noted  . At risk for falls 09/29/2016  . 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 Seizure disorder ([place]) 05/05/2016  . Chronic bilateral low back pain without sciatica 05/05/2016  . Hx of thrombocytopenia 11/07/2015  . Normocytic anemia 07/17/2015  . History of sick sinus syndrome 04/16/2015  . Chest wall pain 11/29/2014  . Hypertension 10/11/2014  . Hyperlipidemia 10/11/2014  . Idiopathic Parkinson's disease ([place]) 05/02/2014  . Leg pain, right 05/02/2014  . Back pain, chronic 12/28/2013  . Clinical depression 08/29/2013  . Has a tremor 08/29/2013  . Mechanical and motor problems with internal organs 08/29/2013  . Disordered sleep 08/29/2013  . Amnesia 08/29/2013  . Fatigue 08/29/2013  . Fall 08/29/2013  . Dizziness 08/29/2013  . Difficulty in walking 08/29/2013  .  Recurrent major depressive disorder, in partial remission ([place]) 08/29/2013  . Adult hypothyroidism 06/16/2013  . Acquired trigger finger 06/16/2013  . Arrhythmia, sinus node 06/16/2013  . HLD (hyperlipidemia) 06/16/2013  . Benign essential HTN 06/16/2013  . Pure hypercholesterolemia 06/16/2013    Past Surgical History:  Procedure Laterality Date  . APPENDECTOMY    . CARDIAC CATHETERIZATION     09  . CATARACT EXTRACTION    . HERNIA REPAIR  20 years ago   umbiluical   . HERNIA REPAIR Left 07-25-13   inguinal  . INSERT / REPLACE / REMOVE PACEMAKER     dr Ilona Sorrel     09  . JOINT REPLACEMENT     lt knee  . JOINT REPLACEMENT     right hip  . KNEE SURGERY Left   . LUMBAR LAMINECTOMY/DECOMPRESSION MICRODISCECTOMY  08/06/2011   Procedure: LUMBAR LAMINECTOMY/DECOMPRESSION MICRODISCECTOMY 2 LEVELS;  Surgeon: Eustace Moore, MD;  Location: [place] [facility];  Service: Neurosurgery;  Laterality: N/A;  Lumbar two three, lumbar three four decompressive laminectomy  . PARTIAL HIP ARTHROPLASTY Right 2014  . SHOULDER SURGERY Right   . thumbs     trigger thumbs    Prior to Admission medications   Medication Sig Start Date End Date Taking? Authorizing Provider  aspirin EC 81 MG tablet Take 81 mg by mouth daily.    [provider]  bisacodyl (DULCOLAX) 5 MG EC tablet Take 1 tablet (5 mg total) by mouth daily as needed for moderate constipation. Patient taking differently: Take 5 mg  by mouth daily.  03/12/17   Lavonia Drafts, MD  carbidopa-levodopa (SINEMET CR) 50-200 MG tablet Take 1 tablet by mouth 3 (three) times daily. 03/12/17   Lavonia Drafts, MD  citalopram (CELEXA) 20 MG tablet Take 1 tablet (20 mg total) by mouth daily. 03/12/17   Lavonia Drafts, MD  docusate sodium (COLACE) 100 MG capsule Take 100 mg by mouth 2 (two) times daily.    [provider]  finasteride (PROSCAR) 5 MG tablet Take 1 tablet (5 mg total) by mouth daily. 03/12/17   Lavonia Drafts, MD  fluticasone  (FLONASE) 50 MCG/ACT nasal spray SHAKE LIQUID AND USE 2 SPRAYS IN EACH NOSTRIL DAILY Patient taking differently: Place 2 sprays into both nostrils daily.  11/29/15   Roselee Nova, MD  gabapentin (NEURONTIN) 100 MG capsule Take 200 mg by mouth at bedtime.    [provider]  lamoTRIgine (LAMICTAL) 200 MG tablet Take 200 mg by mouth 2 (two) times daily.    [provider]  levothyroxine (SYNTHROID, LEVOTHROID) 88 MCG tablet Take 88 mcg by mouth daily before breakfast.    [provider]  lisinopril (PRINIVIL,ZESTRIL) 10 MG tablet Take 1 tablet (10 mg total) by mouth daily. 03/12/17   Lavonia Drafts, MD  loratadine (CLARITIN) 10 MG tablet Take 1 tablet (10 mg total) by mouth daily. Patient not taking: Reported on 09/08/2016 07/08/15   Roselee Nova, MD  Melatonin 5 MG TABS Take 1 tablet by mouth at bedtime. Reported on 03/21/2015    [provider]  Multiple Vitamin (MULITIVITAMIN WITH MINERALS) TABS Take 1 tablet by mouth daily.    [provider]  polyethylene glycol (MIRALAX / GLYCOLAX) packet Take 17 g by mouth 2 (two) times daily.    [provider]  pravastatin (PRAVACHOL) 20 MG tablet Take 1 tablet (20 mg total) by mouth daily. 03/12/17   Lavonia Drafts, MD  risperiDONE (RISPERDAL) 0.5 MG tablet Take 0.5 mg by mouth every morning.    [provider]  risperiDONE (RISPERDAL) 2 MG tablet Take 1 tablet (2 mg total) by mouth at bedtime. 03/12/17   Lavonia Drafts, MD  senna (SENOKOT) 8.6 MG TABS tablet Take 2 tablets (17.2 mg total) by mouth 2 (two) times daily. 11/15/17   Carrie Mew, MD  traMADol (ULTRAM) 50 MG tablet Take 1 tablet (50 mg total) by mouth 3 (three) times daily as needed for moderate pain. Patient taking differently: Take 50 mg by mouth every 8 (eight) hours as needed for moderate pain.  03/12/17   Harvest Dark, MD    Allergies No known allergies and Adhesive [tape]  Family History  Problem Relation  Age of Onset  . Prostate cancer Brother   . Kidney cancer Neg Hx   . Bladder Cancer Neg Hx     Social History Social History   Tobacco Use  . Smoking status: Former Smoker    Last attempt to quit: 08/04/1961    Years since quitting: 56.6  . Smokeless tobacco: Never Used  Substance Use Topics  . Alcohol use: No    Comment: socially  . Drug use: No    Review of Systems Unable to obtain reliable ROS secondary to AMS/dementia ____________________________________________   PHYSICAL EXAM:  VITAL SIGNS: ED Triage Vitals [02/26/18 1308]  Enc Vitals Group     BP      Pulse      Resp      Temp      Temp src  SpO2      Weight 178 lb (80.7 kg)     Height 5\' 9"  (1.753 m)     Head Circumference      Peak Flow      Pain Score 0   Constitutional: Awake and alert. Not oriented to city, date, month, or events.  Eyes: Conjunctivae are normal.  ENT      Head: Normocephalic and atraumatic.      Nose: No congestion/rhinnorhea.      Mouth/Throat: Mucous membranes are moist.      Neck: No stridor. Hematological/Lymphatic/Immunilogical: No cervical lymphadenopathy. Cardiovascular: Normal rate, regular rhythm.  No murmurs, rubs, or gallops.  Respiratory: Normal respiratory effort without tachypnea nor retractions. Breath sounds are clear and equal bilaterally. No wheezes/rales/rhonchi. Gastrointestinal: Soft and non tender. No rebound. No guarding.  Genitourinary: Deferred Musculoskeletal: Normal range of motion in all extremities. No lower extremity edema. Neurologic:  Disoriented. Moving all extremities.  Skin:  Skin is warm, dry and intact. No rash noted. Psychiatric: Mood and affect are normal. Speech and behavior are normal. Patient exhibits appropriate insight and judgment.  ____________________________________________    LABS (pertinent positives/negatives)  Trop <0.03 Bmp wnl ua concerning for infection ____________________________________________   EKG  I,  Nance Pear, attending physician, personally viewed and interpreted this EKG  EKG Time: 1312 Rate: 62 Rhythm: atrial paced rhythm Axis: left axis deviation QRS: narrow ST changes: no st elevation Impression: abnormal ekg ____________________________________________    RADIOLOGY  None   ____________________________________________   PROCEDURES  Procedures  ____________________________________________   INITIAL IMPRESSION / ASSESSMENT AND PLAN / ED COURSE  Pertinent labs & imaging results that were available during my care of the patient were reviewed by me and considered in my medical decision making (see chart for details).   Patient presented to the emergency department today because of an episode of decreased responsiveness.  Here in emergency department patient is awake.  Work-up is concerning for urinary tract infection.  I do think this could explain the patient's symptoms.  Discussed finding with patient and family.  Will give dose of IV antibiotics here and discharge with further antibiotics.  ____________________________________________   FINAL CLINICAL IMPRESSION(S) / ED DIAGNOSES  Final diagnoses:  Altered mental status, unspecified altered mental status type  Lower urinary tract infectious disease     Note: This dictation was prepared with Dragon dictation. Any transcriptional errors that result from this process are unintentional     Nance Pear, MD 02/26/18 1729

## 2018-02-26 NOTE — Discharge Instructions (Addendum)
Please seek medical attention for any high fevers, chest pain, shortness of breath, change in behavior, persistent vomiting, bloody stool or any other new or concerning symptoms.  

## 2018-02-26 NOTE — ED Notes (Signed)
ED Provider at bedside. 

## 2018-02-28 LAB — URINE CULTURE

## 2018-03-08 DIAGNOSIS — F331 Major depressive disorder, recurrent, moderate: Secondary | ICD-10-CM | POA: Diagnosis not present

## 2018-03-08 DIAGNOSIS — G20 Parkinson's disease: Secondary | ICD-10-CM | POA: Diagnosis not present

## 2018-03-08 DIAGNOSIS — G3183 Dementia with Lewy bodies: Secondary | ICD-10-CM | POA: Diagnosis not present

## 2018-03-24 ENCOUNTER — Non-Acute Institutional Stay: Payer: Medicaid Other | Admitting: Student

## 2018-03-24 VITALS — BP 130/80 | HR 80 | Resp 18 | Wt 172.4 lb

## 2018-03-24 DIAGNOSIS — Z515 Encounter for palliative care: Secondary | ICD-10-CM | POA: Diagnosis not present

## 2018-03-24 NOTE — Progress Notes (Signed)
[facility] Telephone: [phone] Fax: [phone]  PATIENT NAME: Lawrence KIMBERLIN Sr. DOB: 02/10/1932 MRN: [id number]  PRIMARY CARE PROVIDER:   [facility]  REFERRING PROVIDER:  [facility] [address] [place], [place] [address]  RESPONSIBLE PARTY: wife, Lawrence Valdez  ASSESSMENT: Lawrence Valdez is an 85 year old patient with multiple medical problems including Parkinson's disease, essential tremor, syncope and collapse, atrial fibrillation, weakness, constipation, hypokalemia, history of falls, unspecified psychosis not due to a substance or a known physiological condition, convulsions, lower back pain, anemia, conduction disorder, other lack of coordination, essential hypertension, hyperlipidemia, right leg pain, other dorsalgia, sleep disorder, chronic fatigue, other amnesia, dizziness and giddiness, major depressive disorder-recurrent, hypothyroidism, sick sinus syndrome, allergic rhinitis, muscle weakness, difficulty walking, headache, dysphasia. He is observed sitting in wheel chair; his wife is pushing him about nursing unit. He does answer direct questions; cooperative with assessment. We discussed ongoing goals of care. We discussed code status. Wife expresses sadness that she will be moving further away and will not be able to visit patient as much.    RECOMMENDATIONS and PLAN:   1. CODE STATUS. DNR is already in place, on chart. Continued discussion regarding desire for comfortable death including details regarding no heroic or extraordinary measures will be used to keep the patient alive.  2. Medical goals of therapy: At this time goals of therapy are focused on comfort and symptom management. Will continue to follow and refer for Hospice admission assessment when patient meets criteria per guidelines.  3. Symptom management: depression-currently being managed by PCP and he is seen by neurology and psychiatry. 4. Discharge  Planning: Patient will continue to reside at [place]. 5. Emotional/spiritual support: Discussed with patient's wife Lawrence Valdez and staff nurse Lawrence Valdez. They are encouraged to call with questions.   Palliative Care to continue to follow for emotional/spiritual support, ongoing discussions trajectory of chronic disease progression, medical goals of therapy, monitor for symptoms with management, and reduce ED and hospitalizations with recommendations.  Palliative Medicine to follow up in 6 weeks or sooner, if needed.    I spent 15 minutes providing this consultation,  from 11:15am to 11:30am. More than 50% of the time in this consultation was spent coordinating communication.   HISTORY OF PRESENT ILLNESS:  Lawrence Coffee Sr. is a 85 y.o. year old male with multiple medical problems including Parkinson's disease, essential tremor, syncope and collapse, atrial fibrillation, weakness, constipation, hypokalemia, history of falls, unspecified psychosis not due to a substance or a known physiological condition, convulsions, lower back pain, anemia, conduction disorder, other lack of coordination, essential hypertension, hyperlipidemia, right leg pain, other dorsalgia, sleep disorder, chronic fatigue, other amnesia, dizziness and giddiness, major depressive disorder-recurrent, hypothyroidism, sick sinus syndrome, allergic rhinitis, muscle weakness, difficulty walking, headache, dysphasia. Palliative Care was asked to help address ongoing goals of care. Lawrence Valdez is currently dependent for activities of daily living and is wheelchair-bound. Wife speaks of his decline as he went from being ambulatory to wheel chair bound in one year. He denies pain. He reports sleeping well "sometimes." Lawrence Valdez is being followed by  psychiatry for individual Psychotherapy. Staff reports that patient does exhibit agitation at times. Staff report patient doing better with one-on-one care. His appetite varies; he  does have tremors but does sometimes feed himself. Lawrence Valdez is used for transfers. He did have a fall on 03/23/2018, where he attempted to transfer himself to bed unassisted and was found on  his knees; no apparent injury reported. He is incontinent of bowel and bladder. He was seen in emergency room on 02/26/18 due to decreased responsiveness; he was found to have a urinary tract infection and was treated with keflex. No other changes or declines reported by staff or wife Lawrence Valdez.   CODE STATUS: DNR  PPS: 30% HOSPICE ELIGIBILITY/DIAGNOSIS: TBD  PAST MEDICAL HISTORY:  Past Medical History:  Diagnosis Date  . Arthritis   . Bradycardia   . Chronic kidney disease    enlarged prostrate  . Depression   . Dysrhythmia   . Hypertension   . Hypothyroid   . Hypothyroidism   . Pacemaker   . Parkinson disease ([place])   . Vertigo     SOCIAL HX:  Social History   Tobacco Use  . Smoking status: Former Smoker    Last attempt to quit: 08/04/1961    Years since quitting: 56.6  . Smokeless tobacco: Never Used  Substance Use Topics  . Alcohol use: No    Comment: socially    ALLERGIES:  Allergies  Allergen Reactions  . No Known Allergies   . Oxycontin [Oxycodone Hcl]   . Adhesive [Tape] Itching and Other (See Comments)    blisters     PERTINENT MEDICATIONS:  Medications: Percocet 5/325 one tab every 12 hours, Medpass 90cc twice daily, Remeron 37.5mg  qhs, melatonin 5mg  qhs, Risperdal 2mg  daily at bedtime, Zoloft 75mg  daily, Colace 100mg  twice daily, Lamictal 200mg  twice daily, Carbidopa levodopa ER 500-200 three times a day, Gabapentin 100mg  two caps daily at bedtime, finasteride 5mg  daily, Flonase 0.05% to sprays in each nostril daily, multivitamin daily, Synthroid 20mcg daily, senokot-s 8.6-50mg  two tabs twice daily, Ketoconazole 2% shampoo to face daily, clindamycin 1% daily, risperidone 0.5mg  each morning, Miralax 17gm daily, bisacodyl EC 5mg  two tablets daily.  PHYSICAL EXAM:   General:  NAD Cardiovascular: regular rate and rhythm Pulmonary: clear ant fields Abdomen: soft, nontender, + bowel sounds GU: no suprapubic tenderness Extremities: 2+non-pitting edema, no joint deformities Skin: no rashes Neurological: Weakness but otherwise nonfocal  Ezekiel Slocumb, NP

## 2018-04-04 DIAGNOSIS — L218 Other seborrheic dermatitis: Secondary | ICD-10-CM | POA: Diagnosis not present

## 2018-04-04 DIAGNOSIS — L738 Other specified follicular disorders: Secondary | ICD-10-CM | POA: Diagnosis not present

## 2018-04-05 DIAGNOSIS — F331 Major depressive disorder, recurrent, moderate: Secondary | ICD-10-CM | POA: Diagnosis not present

## 2018-04-05 DIAGNOSIS — F0280 Dementia in other diseases classified elsewhere without behavioral disturbance: Secondary | ICD-10-CM | POA: Diagnosis not present

## 2018-04-05 DIAGNOSIS — R569 Unspecified convulsions: Secondary | ICD-10-CM | POA: Diagnosis not present

## 2018-04-05 DIAGNOSIS — F418 Other specified anxiety disorders: Secondary | ICD-10-CM | POA: Diagnosis not present

## 2018-04-05 DIAGNOSIS — G20 Parkinson's disease: Secondary | ICD-10-CM | POA: Diagnosis not present

## 2018-04-05 DIAGNOSIS — G3183 Dementia with Lewy bodies: Secondary | ICD-10-CM | POA: Diagnosis not present

## 2018-05-05 ENCOUNTER — Other Ambulatory Visit: Payer: Medicare Other | Admitting: Student

## 2018-05-05 DIAGNOSIS — Z515 Encounter for palliative care: Secondary | ICD-10-CM

## 2018-05-05 NOTE — Progress Notes (Signed)
[facility] Consult Note Telephone: [phone]  Fax: [phone]  PATIENT NAME: Lawrence HUMBLE Sr. DOB: 1932-02-19 MRN: [id number]  PRIMARY CARE PROVIDER: Dr. Daneen Schick  REFERRING PROVIDER:  Dr. Daneen Schick   RESPONSIBLE PARTY: Wife, Lawrence Valdez  ASSESSMENT:  Lawrence Valdez is sitting up to wheel chair in his room upon arrival. He welcomes visit. No acute distress noted. No agitation noted, but he does appear to be fixated on packing items that are in his dresser. He is cooperative with assessment. Discussed ongoing goals of care. Discussed with wife Lawrence Valdez via phone. Discussed with staff nurse Lawrence Valdez.     RECOMMENDATIONS and PLAN:  1. CODE STATUS. DNR is already in place, on chart. Continued discussion regarding desire for comfortable death including details regarding no heroic or extraordinary measures will be used to keep the patient alive.  2. Medical goals of therapy: At this time goals of therapy are focused on comfort and symptom management. Will continue to follow and refer for Hospice admission assessment when patient meets criteria per guidelines.  3. Symptom management: depression-currently being managed by PCP and he is seen by neurology and psychiatry. 4. Discharge Planning: Patient will continue to reside at [place]. 5. Emotional/spiritual support: Discussed with patient's wife Personal assistant Lawrence Valdez. They are encouraged to call with questions.   Palliative Care to continue to follow for emotional/spiritual support, ongoing discussions trajectory of chronic disease progression, medical goals of therapy, monitor for symptoms with management, and reduce ED and hospitalizations with recommendations.  Palliative Medicine to follow up in 6 weeks or sooner, if needed.  I spent 25 minutes providing this consultation,  from 1:55pm  to 2:20pm. More than 50% of the time in this consultation was spent  coordinating communication.   HISTORY OF PRESENT ILLNESS:  Lawrence Coffee Sr. is a 85 y.o. male with multiple medical problems including Parkinson's disease, essential tremor, syncope and collapse, atrial fibrillation, weakness, constipation, hypokalemia, history of falls, unspecified psychosis not due to a substance or a known physiological condition, convulsions, lower back pain, anemia, conduction disorder, other lack of coordination, essential hypertension, hyperlipidemia, right leg pain, other dorsalgia, sleep disorder, chronic fatigue, other amnesia, dizziness and giddiness, major depressive disorder-recurrent, hypothyroidism, sick sinus syndrome, allergic rhinitis, muscle weakness, difficulty walking, headache, dysphasia. Palliative Care was asked to help address ongoing goals of care. Lawrence Valdez is currently dependent for activities of daily living and is wheelchair-bound. He is incontinent of bowel and bladder. He denies pain. He denies shortness of breath. He states he has been sleeping well most nights, but his roommate was awake last night, which kept him awake. Lawrence Valdez is being followed by psychiatry for individual Psychotherapy. Staff reports that patient does exhibit agitation at times. Staff today, report patient packing up his belongings, which he does from time to time. Staff report patient doing better with one-on-one care. His appetite has been good per staff; he does have tremors but does sometimes feed himself. Lawrence Valdez is used for transfers. No recent falls reported. New orders on 04/29/2018 for Vitamin D 1000IU daily, prevnar 13, synthroid increased to 149mcg daily, setraline increased to 100mg  qhs, senna-S changed to two tabs qhs. No other changes or declines reported by staff or wife Lawrence Valdez.   CODE STATUS: DNR  PPS: 30% HOSPICE ELIGIBILITY/DIAGNOSIS: TBD  PAST MEDICAL HISTORY:  Past Medical History:  Diagnosis Date  . Arthritis   . Bradycardia   . Chronic kidney disease  enlarged prostrate  . Depression   . Dysrhythmia   . Hypertension   . Hypothyroid   . Hypothyroidism   . Pacemaker   . Parkinson disease ([place])   . Vertigo     SOCIAL HX:  Social History   Tobacco Use  . Smoking status: Former Smoker    Last attempt to quit: 08/04/1961    Years since quitting: 56.7  . Smokeless tobacco: Never Used  Substance Use Topics  . Alcohol use: No    Comment: socially    ALLERGIES:  Allergies  Allergen Reactions  . No Known Allergies   . Oxycontin [Oxycodone Hcl]   . Adhesive [Tape] Itching and Other (See Comments)    blisters    PHYSICAL EXAM:   General: NAD Cardiovascular: regular rate and rhythm Pulmonary: clear ant fields Abdomen: soft, nontender, + bowel sounds GU: no suprapubic tenderness Extremities: 2+ non-pitting edema, no joint deformities Skin: no rashes Neurological: Weakness, tremors noted  Lawrence Slocumb, NP

## 2018-07-21 ENCOUNTER — Other Ambulatory Visit: Payer: Self-pay

## 2018-07-21 ENCOUNTER — Non-Acute Institutional Stay: Payer: Medicare Other | Admitting: Primary Care

## 2018-07-21 DIAGNOSIS — Z515 Encounter for palliative care: Secondary | ICD-10-CM

## 2018-07-21 NOTE — Progress Notes (Signed)
[facility] Consult Note Telephone: [phone]  Fax: [phone]  PATIENT NAME: Lawrence Valdez: September 03, 1931 MRN: [id number]  PRIMARY CARE PROVIDER:   Orvis Brill, [facility]  REFERRING PROVIDER:  [facility] [address], [place] [address]  RESPONSIBLE PARTY:   Extended Emergency Contact Information Primary Emergency Contact: Lawrence Valdez,Lawrence Valdez Address: [place], [place] [address] [place] Phone: [phone] Mobile Phone: [phone] Relation: Spouse Secondary Emergency Contact: Lawrence Valdez,Lawrence Valdez Address: [place], [place] [address] Johnnette Litter of [place] Phone: [phone] Relation: Son  Palliative Care was asked to follow patient by consultation request of Dr. Orvis Brill, [facility]  . This is a follow up visit.   ASSESSMENT and RECOMMENDATIONS:   1. Goals of Care: DNR, POA is  Lawrence Valdez, pt wife. We spoke on the phone after telemeeting with patient. She is currently at her daughter's home. She is interested in updates from palliative on an ongoing basis. Does not want him in pain, wants him comfortable. States patient would not want to live compromised. Does want the treatable treated.  No intubation or  feeding tube. Will upload to [facility].  2. Nutrition: Continue with current effective bowel program. Staff states appetite is ok, BM large. Has chronic constipation.   3. Mobility: Had discussed ordering geri chair for ability to recline and sit up more in order to reduce pressure on. sacrum  Will check if this has been ordered yet.   4. Fatigue: Continue to monitor.  States he is tired, sleeps well, up in chair to get around when able to go out of the room. Wife states seizures with fatigue and confusion postictally, up to 24 hours. Often does not eat.   5. Depression: Continue to address with psychiatry. No improvement, states he  does do activities. States fishing was an enjoyed pastime.  Palliative care will continue to follow for goals of care clarification and symptom management. Return 4-6 weeks or prn.  I spent 25 minutes providing this consultation,  from 1400 to 1425. More than 50% of the time in this consultation was spent coordinating communication.   HISTORY OF PRESENT ILLNESS:  Lawrence GRIM Sr. is a 85 y.o. year old male with multiple medical problems including Arthritis, parkinsons, seizure disorder, hypothyroid, vertigo. Palliative Care was asked to help address goals of care.   CODE STATUS: DNR  PPS: 30% HOSPICE ELIGIBILITY/DIAGNOSIS: TBD  PAST MEDICAL HISTORY:  Past Medical History:  Diagnosis Date  . Arthritis   . Bradycardia   . Chronic kidney disease    enlarged prostrate  . Depression   . Dysrhythmia   . Hypertension   . Hypothyroid   . Hypothyroidism   . Pacemaker   . Parkinson disease ([place])   . Vertigo     SOCIAL HX:  Social History   Tobacco Use  . Smoking status: Former Smoker    Last attempt to quit: 08/04/1961    Years since quitting: 57.0  . Smokeless tobacco: Never Used  Substance Use Topics  . Alcohol use: No    Comment: socially    ALLERGIES:  Allergies  Allergen Reactions  . No Known Allergies   . Oxycontin [Oxycodone Hcl]   . Adhesive [Tape] Itching and Other (See Comments)    blisters     PERTINENT MEDICATIONS:  Outpatient  Encounter Medications as of 07/21/2018  Medication Sig  . bisacodyl (DULCOLAX) 5 MG EC tablet Take 1 tablet (5 mg total) by mouth daily as needed for moderate constipation. (Patient taking differently: Take 10 mg by mouth daily. )  . carbidopa-levodopa (SINEMET CR) 50-200 MG tablet Take 1 tablet by mouth 3 (three) times daily.  . cephALEXin (KEFLEX) 500 MG capsule Take 1 capsule (500 mg total) by mouth 3 (three) times daily. (Patient not taking: Reported on 05/05/2018)  . cholecalciferol (VITAMIN D3) 25 MCG (1000 UT) tablet Take 1,000  Units by mouth daily.  . clindamycin (CLEOCIN T) 1 % external solution Apply 1 application topically daily.  . finasteride (PROSCAR) 5 MG tablet Take 1 tablet (5 mg total) by mouth daily.  . fluticasone (FLONASE) 50 MCG/ACT nasal spray SHAKE LIQUID AND USE 2 SPRAYS IN EACH NOSTRIL DAILY (Patient taking differently: Place 2 sprays into both nostrils daily. )  . gabapentin (NEURONTIN) 100 MG capsule Take 200 mg by mouth at bedtime.  Marland Kitchen ketoconazole (NIZORAL) 2 % shampoo Apply 1 application topically daily.  Marland Kitchen lamoTRIgine (LAMICTAL) 200 MG tablet Take 200 mg by mouth 2 (two) times daily.  Marland Kitchen levothyroxine (SYNTHROID, LEVOTHROID) 100 MCG tablet Take 100 mcg by mouth daily before breakfast.   . mirtazapine (REMERON) 15 MG tablet Take 15 mg by mouth at bedtime.  . Multiple Vitamin (MULTIVITAMIN) tablet Take 1 tablet by mouth daily.  Marland Kitchen oxyCODONE-acetaminophen (PERCOCET/ROXICET) 5-325 MG tablet Take 1 tablet by mouth every 12 (twelve) hours.  . polyethylene glycol (MIRALAX / GLYCOLAX) packet Take 17 g by mouth 2 (two) times daily.  . risperiDONE (RISPERDAL) 0.5 MG tablet Take 0.5 mg by mouth every morning.  . risperiDONE (RISPERDAL) 2 MG tablet Take 1 tablet (2 mg total) by mouth at bedtime.  . senna (SENOKOT) 8.6 MG TABS tablet Take 2 tablets (17.2 mg total) by mouth 2 (two) times daily. (Patient taking differently: Take 2 tablets by mouth at bedtime. )  . sertraline (ZOLOFT) 100 MG tablet Take 100 mg by mouth daily.   . traMADol (ULTRAM) 50 MG tablet Take 1 tablet (50 mg total) by mouth 3 (three) times daily as needed for moderate pain. (Patient not taking: Reported on 02/26/2018)   Facility-Administered Encounter Medications as of 07/21/2018  Medication  . bupivacaine (PF) (MARCAINE) 0.25 % injection 30 mL  . bupivacaine (PF) (MARCAINE) 0.25 % injection 30 mL  . ceFAZolin (ANCEF) IVPB 1 g/50 mL premix  . ceFAZolin (ANCEF) IVPB 1 g/50 mL premix  . fentaNYL (SUBLIMAZE) injection 100 mcg  . lactated  ringers infusion 1,000 mL  . lactated ringers infusion 1,000 mL  . lidocaine (PF) (XYLOCAINE) 1 % injection 10 mL  . midazolam (VERSED) 5 MG/5ML injection 5 mg  . orphenadrine (NORFLEX) injection 60 mg  . orphenadrine (NORFLEX) injection 60 mg  . triamcinolone acetonide (KENALOG-40) injection 40 mg    PHYSICAL EXAM/ROS with patient and SNF staff.  Wt: 187 lb??, eating well  General: NAD, frail appearing, thin, denies pain. States fatigue Cardiovascular:denies pain Pulmonary:no cough Abdomen: good intake, bms regular with laxatives Extremities: no edema, stiffness duet OA, PD Skin: no rashes or wounds , sore sacrum Neurological: Weakness , flat affect, deficits from neurological disease process.  Cyndia Skeeters DNP, AGPCNP-BC

## 2018-08-18 ENCOUNTER — Non-Acute Institutional Stay: Payer: Medicare Other | Admitting: Primary Care

## 2018-08-18 ENCOUNTER — Other Ambulatory Visit: Payer: Self-pay

## 2018-08-18 DIAGNOSIS — Z515 Encounter for palliative care: Secondary | ICD-10-CM

## 2018-08-18 NOTE — Progress Notes (Signed)
[facility] Consult Note Telephone: [phone]  Fax: [phone]  TELEHEALTH VISIT STATEMENT Due to the COVID-19 crisis, this visit was done via telemedicine from my office. It was initiated and consented to by this patient and/or family.  PATIENT NAME: Lawrence BILL Sr. DOB: 1932-03-06 MRN: [id number]  PRIMARY CARE PROVIDER:   [facility]  REFERRING PROVIDER:  [facility] [place], [place] [address]  RESPONSIBLE PARTY:   Extended Emergency Contact Information Primary Emergency Contact: Nagorski,Jamie Address: [address]          [place], [place] [address] [place] of [place] Phone: [phone] Relation: Spouse Secondary Emergency Contact: Pickrel,Robert Address: [address] [place], [place] of [place] Phone: [phone] Relation: Son  Palliative Care was asked to follow patient by consultation request of Dr. Orvis Brill, [facility]  . This is a follow up visit.   ASSESSMENT and RECOMMENDATIONS:   1. Depression/Mood; Recommend social work or chaplain visit if possible. Followed by psychiatry. Patient was very sad today, staff states his wife visited at the window over the weekend and that he has been very down ever since. Staff don't feel he has somatic issues but he seems depressed. We discussed difficulty of not seeing family members during the covid epidemic.   2. Mobility: He is up in chair today, will f/u re ordering of the new geri chair. I am not sure from video if this is a new chair.  3. Goals of Care: Pt has DNR, I have not had in person meeting with wife to work on MOST, due to [employer].  Palliative care will continue to follow for goals of care clarification and symptom management. Return 4 weeks or prn.  I spent 15 minutes providing this consultation,  from 1330 to 1345. More than 50% of the time in this consultation  was spent coordinating communication.   HISTORY OF PRESENT ILLNESS:  Lawrence SANTISTEVAN Sr. is a 85 y.o. year old male with multiple medical problems including Arthritis, parkinsons, seizure disorder, hypothyroid, vertigo.. Palliative Care was asked to help address goals of care.   CODE STATUS: DNR  PPS: 30% HOSPICE ELIGIBILITY/DIAGNOSIS: TBD  PAST MEDICAL HISTORY:  Past Medical History:  Diagnosis Date  . Arthritis   . Bradycardia   . Chronic kidney disease    enlarged prostrate  . Depression   . Dysrhythmia   . Hypertension   . Hypothyroid   . Hypothyroidism   . Pacemaker   . Parkinson disease ([place])   . Vertigo     SOCIAL HX:  Social History   Tobacco Use  . Smoking status: Former Smoker    Last attempt to quit: 08/04/1961    Years since quitting: 57.0  . Smokeless tobacco: Never Used  Substance Use Topics  . Alcohol use: No    Comment: socially    ALLERGIES:  Allergies  Allergen Reactions  . No Known Allergies   . Oxycontin [Oxycodone Hcl]   . Adhesive [Tape] Itching and Other (See Comments)    blisters     PERTINENT MEDICATIONS:  Outpatient Encounter Medications as of 08/18/2018  Medication Sig  . bisacodyl (DULCOLAX) 5 MG EC tablet Take 1 tablet (5 mg total) by mouth daily as needed for moderate constipation. (Patient taking differently: Take 10 mg by mouth daily. )  . carbidopa-levodopa (SINEMET CR) 50-200 MG tablet Take 1 tablet  by mouth 3 (three) times daily.  . cephALEXin (KEFLEX) 500 MG capsule Take 1 capsule (500 mg total) by mouth 3 (three) times daily. (Patient not taking: Reported on 05/05/2018)  . cholecalciferol (VITAMIN D3) 25 MCG (1000 UT) tablet Take 1,000 Units by mouth daily.  . clindamycin (CLEOCIN T) 1 % external solution Apply 1 application topically daily.  . finasteride (PROSCAR) 5 MG tablet Take 1 tablet (5 mg total) by mouth daily.  . fluticasone (FLONASE) 50 MCG/ACT nasal spray SHAKE LIQUID AND USE 2 SPRAYS IN EACH NOSTRIL DAILY (Patient  taking differently: Place 2 sprays into both nostrils daily. )  . gabapentin (NEURONTIN) 100 MG capsule Take 200 mg by mouth at bedtime.  Marland Kitchen ketoconazole (NIZORAL) 2 % shampoo Apply 1 application topically daily.  Marland Kitchen lamoTRIgine (LAMICTAL) 200 MG tablet Take 200 mg by mouth 2 (two) times daily.  Marland Kitchen levothyroxine (SYNTHROID, LEVOTHROID) 100 MCG tablet Take 100 mcg by mouth daily before breakfast.   . mirtazapine (REMERON) 15 MG tablet Take 15 mg by mouth at bedtime.  . Multiple Vitamin (MULTIVITAMIN) tablet Take 1 tablet by mouth daily.  Marland Kitchen oxyCODONE-acetaminophen (PERCOCET/ROXICET) 5-325 MG tablet Take 1 tablet by mouth every 12 (twelve) hours.  . polyethylene glycol (MIRALAX / GLYCOLAX) packet Take 17 g by mouth 2 (two) times daily.  . risperiDONE (RISPERDAL) 0.5 MG tablet Take 0.5 mg by mouth every morning.  . risperiDONE (RISPERDAL) 2 MG tablet Take 1 tablet (2 mg total) by mouth at bedtime.  . senna (SENOKOT) 8.6 MG TABS tablet Take 2 tablets (17.2 mg total) by mouth 2 (two) times daily. (Patient taking differently: Take 2 tablets by mouth at bedtime. )  . sertraline (ZOLOFT) 100 MG tablet Take 100 mg by mouth daily.   . traMADol (ULTRAM) 50 MG tablet Take 1 tablet (50 mg total) by mouth 3 (three) times daily as needed for moderate pain. (Patient not taking: Reported on 02/26/2018)   Facility-Administered Encounter Medications as of 08/18/2018  Medication  . bupivacaine (PF) (MARCAINE) 0.25 % injection 30 mL  . bupivacaine (PF) (MARCAINE) 0.25 % injection 30 mL  . ceFAZolin (ANCEF) IVPB 1 g/50 mL premix  . ceFAZolin (ANCEF) IVPB 1 g/50 mL premix  . fentaNYL (SUBLIMAZE) injection 100 mcg  . lactated ringers infusion 1,000 mL  . lactated ringers infusion 1,000 mL  . lidocaine (PF) (XYLOCAINE) 1 % injection 10 mL  . midazolam (VERSED) 5 MG/5ML injection 5 mg  . orphenadrine (NORFLEX) injection 60 mg  . orphenadrine (NORFLEX) injection 60 mg  . triamcinolone acetonide (KENALOG-40) injection 40  mg    PHYSICAL EXAM:   General: NAD, frail appearing, thin, tearful.  Cardiovascular: no chest pain Pulmonary: no cough, no sob Abdomen: fair appetite, chronic constipation Extremities: w/c for ambulation Skin: no rashes or wounds Neurological: Weakness , depression, tearful today  Cyndia Skeeters DNP, AGPCNP-BC

## 2018-09-15 ENCOUNTER — Other Ambulatory Visit: Payer: Medicare Other | Admitting: Primary Care

## 2018-09-15 ENCOUNTER — Other Ambulatory Visit: Payer: Self-pay

## 2018-09-15 DIAGNOSIS — Z515 Encounter for palliative care: Secondary | ICD-10-CM

## 2018-09-15 NOTE — Progress Notes (Signed)
[facility] Pall[facility] Consult Note Telephone: [phone]  Fax: [phone]  TELEHEALTH VISIT STATEMENT Due to the COVID-19 crisis, this visit was done via telemedicine from my office. It was initiated and consented to by this patient and/or family.  PATIENT NAME: Lawrence UPHOFF Sr. DOB: 1932-02-18 MRN: [id number]  PRIMARY CARE PROVIDER:  Marisa Hua, MD [phone]  REFERRING PROVIDER:  [facility] [address],  [place] [address] [phone]  RESPONSIBLE PARTY:   Extended Emergency Contact Information Primary Emergency Contact: Washburn,Jamie Address: [address]          [place], [place] [address] [place] of [place] Phone: [phone] Relation: Spouse Secondary Emergency Contact: Markie,Robert Address: [address] [place], [place] of [place] Phone: [phone] Relation: Son  Palliative Care was asked to follow this patient by consultation request of Marisa Hua, MD  This is a follow up visit.  ASSESSMENT AND RECOMMENDATIONS:   1. Goals of Care: Maximize quality of life and symptom management.  2. Symptom Management:   Depression: Much improved over last months' visit. Still wants to go home and misses wife.  Mobility: OOB to bath room with help.Participate in activities in SNF.  3. Family /Caregiver/Community Supports: Wife makes window visits on occasion. She now lives out of town but calls on phone.   4. Cognitive / Functional decline: At baseline. 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 Able to ambulate to bathroom with assistance.   5. Advanced Care Directive: DNR uploaded in [facility] and on file a SNF.  6. Follow up Palliative Care Visit: Palliative care will continue to follow for goals of care clarification and symptom management. Return 6 weeks or prn.  I spent 25 minutes providing this consultation,   from 1330 to 1355. More than 50% of the time in this consultation was spent coordinating communication.   HISTORY OF PRESENT ILLNESS:  Lawrence CURLEY Sr. is a 85 y.o. year old male with multiple medical problems including Arthritis, parkinsons, seizure disorder, hypothyroid, vertigo. Palliative Care was asked to help address goals of care.   CODE STATUS:  DNR  PPS: 30% HOSPICE ELIGIBILITY/DIAGNOSIS: TBD  PAST MEDICAL HISTORY:  Past Medical History:  Diagnosis Date  . Arthritis   . Bradycardia   . Chronic kidney disease    enlarged prostrate  . Depression   . Dysrhythmia   . Hypertension   . Hypothyroid   . Hypothyroidism   . Pacemaker   . Parkinson disease ([place])   . Vertigo     SOCIAL HX:  Social History   Tobacco Use  . Smoking status: Former Smoker    Quit date: 08/04/1961    Years since quitting: 85.1  . Smokeless tobacco: Never Used  Substance Use Topics  . Alcohol use: No    Comment: socially    ALLERGIES:  Allergies  Allergen Reactions  . No Known Allergies   . Oxycontin [Oxycodone Hcl]   . Adhesive [Tape] Itching and Other (See Comments)    blisters     PERTINENT MEDICATIONS:  Outpatient Encounter Medications as of 09/15/2018  Medication Sig  . bisacodyl (DULCOLAX) 5 MG EC tablet Take 1 tablet (5 mg total) by mouth daily as needed for moderate constipation. (Patient taking differently: Take 10 mg by mouth daily. )  . carbidopa-levodopa (SINEMET CR) 50-200 MG tablet Take 1 tablet by mouth 3 (  three) times daily.  . cephALEXin (KEFLEX) 500 MG capsule Take 1 capsule (500 mg total) by mouth 3 (three) times daily. (Patient not taking: Reported on 05/05/2018)  . cholecalciferol (VITAMIN D3) 25 MCG (1000 UT) tablet Take 1,000 Units by mouth daily.  . clindamycin (CLEOCIN T) 1 % external solution Apply 1 application topically daily.  . finasteride (PROSCAR) 5 MG tablet Take 1 tablet (5 mg total) by mouth daily.  . fluticasone (FLONASE) 50 MCG/ACT nasal spray SHAKE  LIQUID AND USE 2 SPRAYS IN EACH NOSTRIL DAILY (Patient taking differently: Place 2 sprays into both nostrils daily. )  . gabapentin (NEURONTIN) 100 MG capsule Take 200 mg by mouth at bedtime.  Marland Kitchen ketoconazole (NIZORAL) 2 % shampoo Apply 1 application topically daily.  Marland Kitchen lamoTRIgine (LAMICTAL) 200 MG tablet Take 200 mg by mouth 2 (two) times daily.  Marland Kitchen levothyroxine (SYNTHROID, LEVOTHROID) 100 MCG tablet Take 100 mcg by mouth daily before breakfast.   . mirtazapine (REMERON) 15 MG tablet Take 15 mg by mouth at bedtime.  . Multiple Vitamin (MULTIVITAMIN) tablet Take 1 tablet by mouth daily.  Marland Kitchen oxyCODONE-acetaminophen (PERCOCET/ROXICET) 5-325 MG tablet Take 1 tablet by mouth every 12 (twelve) hours.  . polyethylene glycol (MIRALAX / GLYCOLAX) packet Take 17 g by mouth 2 (two) times daily.  . risperiDONE (RISPERDAL) 0.5 MG tablet Take 0.5 mg by mouth every morning.  . risperiDONE (RISPERDAL) 2 MG tablet Take 1 tablet (2 mg total) by mouth at bedtime.  . senna (SENOKOT) 8.6 MG TABS tablet Take 2 tablets (17.2 mg total) by mouth 2 (two) times daily. (Patient taking differently: Take 2 tablets by mouth at bedtime. )  . sertraline (ZOLOFT) 100 MG tablet Take 100 mg by mouth daily.   . traMADol (ULTRAM) 50 MG tablet Take 1 tablet (50 mg total) by mouth 3 (three) times daily as needed for moderate pain. (Patient not taking: Reported on 02/26/2018)   Facility-Administered Encounter Medications as of 09/15/2018  Medication  . bupivacaine (PF) (MARCAINE) 0.25 % injection 30 mL  . bupivacaine (PF) (MARCAINE) 0.25 % injection 30 mL  . ceFAZolin (ANCEF) IVPB 1 g/50 mL premix  . ceFAZolin (ANCEF) IVPB 1 g/50 mL premix  . fentaNYL (SUBLIMAZE) injection 100 mcg  . lactated ringers infusion 1,000 mL  . lactated ringers infusion 1,000 mL  . lidocaine (PF) (XYLOCAINE) 1 % injection 10 mL  . midazolam (VERSED) 5 MG/5ML injection 5 mg  . orphenadrine (NORFLEX) injection 60 mg  . orphenadrine (NORFLEX) injection 60 mg   . triamcinolone acetonide (KENALOG-40) injection 40 mg    PHYSICAL EXAM/ROS:  Exam with SNF staff input 97.5- 76-20  133/78 Per snf records Current and past weights: 180 lb, stable with few pound fluctuation General: NAD, frail appearing, BMI 29,  Cardiovascular: no chest pain reported, no edema reported Pulmonary: no cough, no increased SOB on exam, no report of DOE, 97% on RA Abdomen: appetite good, eats 75%, denies constipation, had BM 6/24.  continent of bowel GU: denies dysuria, continent of urine MSK:  no joint deformities Skin: no rashes or wounds reported, skin prep to heels for prevention Neurological: Weakness, depressed not to see his family and wants to go home. No falls, parkinson sx well managed.  Cyndia Skeeters DNP, AGPCNP-BC

## 2018-09-16 ENCOUNTER — Non-Acute Institutional Stay: Payer: Medicare Other | Admitting: Primary Care

## 2018-10-27 ENCOUNTER — Other Ambulatory Visit: Payer: Self-pay

## 2018-10-27 ENCOUNTER — Non-Acute Institutional Stay: Payer: Medicare Other | Admitting: Primary Care

## 2018-10-27 DIAGNOSIS — Z515 Encounter for palliative care: Secondary | ICD-10-CM

## 2018-10-27 NOTE — Progress Notes (Signed)
[facility] Consult Note Telephone: [phone]  Fax: [phone]  TELEHEALTH VISIT STATEMENT Due to the COVID-19 crisis, this visit was done via telemedicine from my office. It was initiated and consented to by this patient and/or family.  PATIENT NAME: Lawrence VANDRUNEN Sr. DOB: 12/31/1931 MRN: [id number]  PRIMARY CARE PROVIDER:   Marisa Hua, [place]   REFERRING PROVIDER:  Marisa Hua, MD [place],  [place] [address] [phone]  RESPONSIBLE PARTY:   Extended Emergency Contact Information Primary Emergency Contact: Grill,Jamie Address: [address]          [place], [place] [address] Johnnette Litter of [place] Phone: [phone] Relation: Spouse Secondary Emergency Contact: Metsker,Robert Address: [address] [place], [place] of [place] Phone: [phone] Relation: Son  Palliative Care was asked to follow this patient by consultation request of Marisa Hua, MD. This is a follow up visit.  ASSESSMENT AND RECOMMENDATIONS:   1. Goals of Care: Maximize quality of life and symptom management.  2. Symptom Management:   Constipation: This is onging issue. Recommend adding senna to regimen, if taking 2 bid add to 3 bid, if taking 2 daily add to 2 bid. Also on daily dulcolax and bid miralax.  3. Family /Caregiver/Community Supports: Recent window visits by wife has calmed patient. Staff reports she has been able to visit frequently in the past week. [place].  4. Cognitive / Functional decline: Staff reports generally usual self. He has a good sense of humor. He needs assistance with some adls, e.g. ambulation, bathroom. Can feed self.  5. Advance Care Directive: DNR  6. Follow up Palliative Care Visit: Palliative care will continue to follow for goals of care clarification and symptom management. Return 4-6  weeks or prn.  I spent 15 minutes providing this consultation,  from 1430 to 1445. More than 50% of the time in this consultation was spent coordinating communication.   HISTORY OF PRESENT ILLNESS:  Lawrence WILLMANN Sr. is a 85 y.o. year old male with multiple medical problems including Arthritis, parkinsons, seizure disorder, hypothyroid, vertigo. Palliative Care was asked to help address goals of care.   CODE STATUS: DNR  PPS: 30% HOSPICE ELIGIBILITY/DIAGNOSIS: TBD  PAST MEDICAL HISTORY:  Past Medical History:  Diagnosis Date  . Arthritis   . Bradycardia   . Chronic kidney disease    enlarged prostrate  . Depression   . Dysrhythmia   . Hypertension   . Hypothyroid   . Hypothyroidism   . Pacemaker   . Parkinson disease ([place])   . Vertigo     SOCIAL HX:  Social History   Tobacco Use  . Smoking status: Former Smoker    Quit date: 08/04/1961    Years since quitting: 57.2  . Smokeless tobacco: Never Used  Substance Use Topics  . Alcohol use: No    Comment: socially    ALLERGIES:  Allergies  Allergen Reactions  . No Known Allergies   . Oxycontin [Oxycodone Hcl]   . Adhesive [Tape] Itching and Other (See Comments)    blisters     PERTINENT MEDICATIONS:  Outpatient Encounter Medications as of 10/27/2018  Medication Sig  . bisacodyl (DULCOLAX) 5 MG EC tablet Take 1 tablet (5 mg total) by mouth daily as needed for moderate constipation. (Patient taking differently: Take 10 mg by mouth daily. )  .  carbidopa-levodopa (SINEMET CR) 50-200 MG tablet Take 1 tablet by mouth 3 (three) times daily.  . cephALEXin (KEFLEX) 500 MG capsule Take 1 capsule (500 mg total) by mouth 3 (three) times daily. (Patient not taking: Reported on 05/05/2018)  . cholecalciferol (VITAMIN D3) 25 MCG (1000 UT) tablet Take 1,000 Units by mouth daily.  . clindamycin (CLEOCIN T) 1 % external solution Apply 1 application topically daily.  . finasteride (PROSCAR) 5 MG tablet Take 1 tablet (5 mg total) by  mouth daily.  . fluticasone (FLONASE) 50 MCG/ACT nasal spray SHAKE LIQUID AND USE 2 SPRAYS IN EACH NOSTRIL DAILY (Patient taking differently: Place 2 sprays into both nostrils daily. )  . gabapentin (NEURONTIN) 100 MG capsule Take 200 mg by mouth at bedtime.  Marland Kitchen ketoconazole (NIZORAL) 2 % shampoo Apply 1 application topically daily.  Marland Kitchen lamoTRIgine (LAMICTAL) 200 MG tablet Take 200 mg by mouth 2 (two) times daily.  Marland Kitchen levothyroxine (SYNTHROID, LEVOTHROID) 100 MCG tablet Take 100 mcg by mouth daily before breakfast.   . mirtazapine (REMERON) 15 MG tablet Take 15 mg by mouth at bedtime.  . Multiple Vitamin (MULTIVITAMIN) tablet Take 1 tablet by mouth daily.  Marland Kitchen oxyCODONE-acetaminophen (PERCOCET/ROXICET) 5-325 MG tablet Take 1 tablet by mouth every 12 (twelve) hours.  . polyethylene glycol (MIRALAX / GLYCOLAX) packet Take 17 g by mouth 2 (two) times daily.  . risperiDONE (RISPERDAL) 0.5 MG tablet Take 0.5 mg by mouth every morning.  . risperiDONE (RISPERDAL) 2 MG tablet Take 1 tablet (2 mg total) by mouth at bedtime.  . senna (SENOKOT) 8.6 MG TABS tablet Take 2 tablets (17.2 mg total) by mouth 2 (two) times daily. (Patient taking differently: Take 2 tablets by mouth at bedtime. )  . sertraline (ZOLOFT) 100 MG tablet Take 100 mg by mouth daily.   . traMADol (ULTRAM) 50 MG tablet Take 1 tablet (50 mg total) by mouth 3 (three) times daily as needed for moderate pain. (Patient not taking: Reported on 02/26/2018)   Facility-Administered Encounter Medications as of 10/27/2018  Medication  . bupivacaine (PF) (MARCAINE) 0.25 % injection 30 mL  . bupivacaine (PF) (MARCAINE) 0.25 % injection 30 mL  . ceFAZolin (ANCEF) IVPB 1 g/50 mL premix  . ceFAZolin (ANCEF) IVPB 1 g/50 mL premix  . fentaNYL (SUBLIMAZE) injection 100 mcg  . lactated ringers infusion 1,000 mL  . lactated ringers infusion 1,000 mL  . lidocaine (PF) (XYLOCAINE) 1 % injection 10 mL  . midazolam (VERSED) 5 MG/5ML injection 5 mg  . orphenadrine  (NORFLEX) injection 60 mg  . orphenadrine (NORFLEX) injection 60 mg  . triamcinolone acetonide (KENALOG-40) injection 40 mg    PHYSICAL EXAM/ROS:   Current and past weights: 186 lb reported late May, 2020, now 175 lb reported. General: NAD, frail, WNWD Cardiovascular: no chest pain reported, no edema reported Pulmonary: no cough, no increased SOB, Room air Abdomen: appetite good eats 50-75%, endorses constipation,  Last bm 4 days ago, continent of bowel GU: denies dysuria, continent of urine MSK:  no joint deformities, Parkinson's mobility limitations Skin: no rashes or wounds reported Neurological: Weakness, dementia, parkinson's with bradykinesia,   Cyndia Skeeters DNP AGPCNP-BC

## 2018-12-01 ENCOUNTER — Non-Acute Institutional Stay: Payer: Medicare Other | Admitting: Primary Care

## 2018-12-01 ENCOUNTER — Other Ambulatory Visit: Payer: Self-pay

## 2018-12-01 DIAGNOSIS — Z515 Encounter for palliative care: Secondary | ICD-10-CM

## 2018-12-01 NOTE — Progress Notes (Signed)
[facility] Consult Note Telephone: [phone]  Fax: [phone]  PATIENT NAME: [place]: [birth year] MRN: [id number]  PRIMARY CARE PROVIDER:   Marisa Hua, MD, [place] [address] [phone]  REFERRING PROVIDER:  Marisa Hua, MD [place],  [place] [address] [phone]  RESPONSIBLE PARTY:   Extended Emergency Contact Information Primary Emergency Contact: Fjeld,Jamie Address: [address]          [place], [place] [address] Johnnette Litter of [place] Phone: [phone] Relation: Spouse Secondary Emergency Contact: Laforte,Robert Address: [address] [place], [place] of [place] Phone: [phone] Relation: Son   ASSESSMENT AND RECOMMENDATIONS:   1. Advance Care Planning/Goals of Care: Goals include to maximize quality of life and symptom management. ACP documents on file, will discuss MOST with family if they'd like to complete.  2. Symptom Management:   Nutrition: Recommend nutritional supplements, weekly weights. States anorexia. Has had 10% wt loss since May 2020. States he does not like food and does not have any more snacks.  Dysphagia; Has had some trouble swallowing pills. Recommend crushing and /or putting in apple sauce. Also recommend reviewing possible deprescribing.  Pain:  Denies pain. States he does not ask for prn medications.   3. Family /Caregiver/Community Supports:  Has wife and daughter who live out of town. I will try to reach them for conferencing tomorrow. Lives in [place] and currently does not have a roommate.He states he has to eat in his room and misses going to the dining hall.   4. Cognitive / Functional decline:  States he does not have hallucinations, appears more alert and upbeat today. Increased hand tremors.   In w/c, needs assist with transfers to toilet, bed. States he falls on occasion but cannot  remember details.  5. Follow up Palliative Care Visit: Palliative care will continue to follow for goals of care clarification and symptom management. Return 4-6 weeks or prn.  I spent 25 minutes providing this consultation,  from 1015 to 1040. More than 50% of the time in this consultation was spent coordinating communication.   HISTORY OF PRESENT ILLNESS:  Lawrence VANGENDEREN Sr. is a 85 y.o. year old male with multiple medical problems including Arthritis, parkinsons, seizure disorder, hypothyroid, vertigo. Palliative Care was asked to follow this patient by consultation request of Marisa Hua, MD to help address advance care planning and goals of care. This is a follow up visit.  CODE STATUS: DNR  PPS: 30% HOSPICE ELIGIBILITY/DIAGNOSIS: TBD  PAST MEDICAL HISTORY:  Past Medical History:  Diagnosis Date  . Arthritis   . Bradycardia   . Chronic kidney disease    enlarged prostrate  . Depression   . Dysrhythmia   . Hypertension   . Hypothyroid   . Hypothyroidism   . Pacemaker   . Parkinson disease ([place])   . Vertigo     SOCIAL HX:  Social History   Tobacco Use  . Smoking status: Former Smoker    Quit date: 08/04/1961    Years since quitting: 57.3  . Smokeless tobacco: Never Used  Substance Use Topics  . Alcohol use: No    Comment: socially    ALLERGIES:  Allergies  Allergen Reactions  . No Known Allergies   . Oxycontin [Oxycodone Hcl]   . Adhesive [Tape] Itching and Other (See Comments)    blisters  PERTINENT MEDICATIONS:  Outpatient Encounter Medications as of 12/01/2018  Medication Sig  . aspirin 81 MG chewable tablet Chew 81 mg by mouth daily.  . bisacodyl (DULCOLAX) 5 MG EC tablet Take 1 tablet (5 mg total) by mouth daily as needed for moderate constipation. (Patient taking differently: Take 10 mg by mouth daily. )  . carbidopa-levodopa (SINEMET CR) 50-200 MG tablet Take 1 tablet by mouth 3 (three) times daily.  . cholecalciferol (VITAMIN D3) 25 MCG  (1000 UT) tablet Take 1,000 Units by mouth daily.  . ferrous sulfate 325 (65 FE) MG tablet Take 325 mg by mouth daily with breakfast.  . finasteride (PROSCAR) 5 MG tablet Take 1 tablet (5 mg total) by mouth daily.  . fluticasone (FLONASE) 50 MCG/ACT nasal spray SHAKE LIQUID AND USE 2 SPRAYS IN EACH NOSTRIL DAILY (Patient taking differently: Place 2 sprays into both nostrils daily. )  . furosemide (LASIX) 20 MG tablet Take 20 mg by mouth.  . gabapentin (NEURONTIN) 100 MG capsule Take 200 mg by mouth at bedtime.  Marland Kitchen ketoconazole (NIZORAL) 2 % shampoo Apply 1 application topically daily.  Marland Kitchen lamoTRIgine (LAMICTAL) 200 MG tablet Take 200 mg by mouth 2 (two) times daily.  Marland Kitchen levothyroxine (SYNTHROID, LEVOTHROID) 100 MCG tablet Take 100 mcg by mouth daily before breakfast.   . mirtazapine (REMERON) 15 MG tablet Take 15 mg by mouth at bedtime.  . mometasone (ELOCON) 0.1 % cream Apply 1 application topically daily. To left side of scalp and other itchy areas.  Marland Kitchen oxyCODONE-acetaminophen (PERCOCET/ROXICET) 5-325 MG tablet Take 1 tablet by mouth every 12 (twelve) hours.  . polyethylene glycol (MIRALAX / GLYCOLAX) packet Take 17 g by mouth 2 (two) times daily.  . risperiDONE (RISPERDAL) 0.5 MG tablet Take 0.5 mg by mouth every morning.  . risperiDONE (RISPERDAL) 2 MG tablet Take 1 tablet (2 mg total) by mouth at bedtime.  Marland Kitchen rOPINIRole (REQUIP) 0.25 MG tablet Take 0.25 mg by mouth 4 (four) times daily.  Marland Kitchen senna (SENOKOT) 8.6 MG TABS tablet Take 2 tablets (17.2 mg total) by mouth 2 (two) times daily. (Patient taking differently: Take 2 tablets by mouth at bedtime. )  . cephALEXin (KEFLEX) 500 MG capsule Take 1 capsule (500 mg total) by mouth 3 (three) times daily. (Patient not taking: Reported on 12/01/2018)  . clindamycin (CLEOCIN T) 1 % external solution Apply 1 application topically daily.  . Multiple Vitamin (MULTIVITAMIN) tablet Take 1 tablet by mouth daily.  . sertraline (ZOLOFT) 100 MG tablet Take 150 mg by  mouth daily.   . traMADol (ULTRAM) 50 MG tablet Take 1 tablet (50 mg total) by mouth 3 (three) times daily as needed for moderate pain. (Patient not taking: Reported on 02/26/2018)   Facility-Administered Encounter Medications as of 12/01/2018  Medication  . bupivacaine (PF) (MARCAINE) 0.25 % injection 30 mL  . bupivacaine (PF) (MARCAINE) 0.25 % injection 30 mL  . ceFAZolin (ANCEF) IVPB 1 g/50 mL premix  . ceFAZolin (ANCEF) IVPB 1 g/50 mL premix  . fentaNYL (SUBLIMAZE) injection 100 mcg  . lactated ringers infusion 1,000 mL  . lactated ringers infusion 1,000 mL  . lidocaine (PF) (XYLOCAINE) 1 % injection 10 mL  . midazolam (VERSED) 5 MG/5ML injection 5 mg  . orphenadrine (NORFLEX) injection 60 mg  . orphenadrine (NORFLEX) injection 60 mg  . triamcinolone acetonide (KENALOG-40) injection 40 mg     PHYSICAL EXAM / ROS:  168.5  Current and past weights: [place] 168.5, 186 lb reported late May, 2020, now  175 lb reported. This is 10% Wt loss General: NAD, frail appearing, thin Cardiovascular: no chest pain reported, no edema, endurance at basline Pulmonary: no cough, no increased SOB, no DOE, room air Abdomen: appetite fair, 50-75% but has had significant wt loss in 3 mos,  denies constipation, incontinent of bowel GU: denies dysuria, incontinent of urine MSK:  no joint deformities, non ambulatory, pivot transfers with assistance Skin: small area on sacrum Neurological: Weakness, denies hallucinations, states boredom and loneliness.  Jason Coop, NP  COVID-19 PATIENT SCREENING TOOL  Person answering questions: _______Staff____________ _____   1.  Is the patient or any family member in the home showing any signs or symptoms regarding respiratory infection?               Person with Symptom- ________NA___________________  a. Fever                                                                          Yes___ No___          ___________________  b. Shortness of breath                                                     Yes___ No___          ___________________ c. Cough/congestion                                       Yes___  No___         ___________________ d. Body aches/pains                                                         Yes___ No___        ____________________ e. Gastrointestinal symptoms (diarrhea, nausea)           Yes___ No___        ____________________  2. Within the past 14 days, has anyone living in the home had any contact with someone with or under investigation for COVID-19?    Yes___ No_X_   Person __________________

## 2018-12-02 ENCOUNTER — Telehealth: Payer: Self-pay | Admitting: Primary Care

## 2018-12-02 NOTE — Telephone Encounter (Signed)
T/c to wife Braylon Steuerwald, Message left to return call to f/u on visit from yesterday.

## 2018-12-05 ENCOUNTER — Telehealth: Payer: Self-pay | Admitting: Primary Care

## 2018-12-05 NOTE — Telephone Encounter (Signed)
I received a call back from Lawrence Valdez about how Lawrence Valdez was doing. I let her know that he said he would prefer to have some snacks. She stated the nursing home is not allowing outside food but she would try to get them to order some thing for him. We discussed his mood and the isolation and the grief that this caused him and her. She said [place] and came [place] here to see him periodically but that it was difficult that she couldn't even go inside. I provided active listening as this grief is, of course, widespread during Covid limitations. I invited her to call with any questions or concerns about Lawrence Valdez care.

## 2019-01-27 ENCOUNTER — Non-Acute Institutional Stay: Payer: Medicare Other | Admitting: Primary Care

## 2019-01-27 ENCOUNTER — Other Ambulatory Visit: Payer: Self-pay

## 2019-01-27 DIAGNOSIS — Z515 Encounter for palliative care: Secondary | ICD-10-CM

## 2019-01-27 NOTE — Progress Notes (Signed)
[facility] Consult Note Telephone: [phone]  Fax: [phone]  TELEHEALTH VISIT STATEMENT Due to the COVID-19 crisis, this visit was done via telemedicine from my office. It was initiated and consented to by this patient and/or family.  PATIENT NAME: Lawrence Valdez [id number] [phone] (home)  DOB: 01/16/1932 MRN: [id number]  PRIMARY CARE PROVIDER:   Marisa Hua, MD, [place] [facility] [address] [phone]  REFERRING PROVIDER:  Marisa Hua, MD [place],  [place] [address] [phone]  RESPONSIBLE PARTY:   Extended Emergency Contact Information Primary Emergency Contact: Pint,Jamie Address: [address]          [place], [place] [address] Johnnette Litter of [place] Phone: [phone] Relation: Spouse Secondary Emergency Contact: Greaves,Robert Address: [address] [place], [place] of [place] Phone: [phone] Relation: Son   ASSESSMENT AND RECOMMENDATIONS:   1. Advance Care Planning/Goals of Care: Goals include to maximize quality of life and symptom management.Wife is POA,  DNR on file  2. Symptom Management:   Pain: Having reported pain. Recommend scheduling tylenol x 1 week, wrapping thumb with ace to provide stability. May need narcotic for several days, e.g tramadol 25 mg q 6 hrs prn pain.  [place] several days ago has extensive bruise on eyes and left thumb. No break per xray reported by staff,  but still has pain and needs immobilization x 1 week  for pain control. He complains of great pain.  3. Family /Caregiver/Community Supports: Wife lives out of town. Lives in [facility] facility.  4. Cognitive / Functional decline: Alert, oriented x 1. States he fell. Poor trunk control with  frequent falls.  5. Follow up Palliative Care Visit: Palliative care will continue to follow for goals of care clarification and  symptom management. Return 6-8 weeks or prn.  I spent 25 minutes providing this consultation,  from 1000 to 1025. More than 50% of the time in this consultation was spent coordinating communication.   HISTORY OF PRESENT ILLNESS:  Lawrence Valdez. is a 85 y.o. year old male with multiple medical problems including PD, frequent falls, MSK pain. Palliative Care was asked to follow this patient by consultation request of Marisa Hua, MD to help address advance care planning and goals of care. This is a follow up visit.  CODE STATUS: DNR  PPS: 30% HOSPICE ELIGIBILITY/DIAGNOSIS: TBD  PAST MEDICAL HISTORY:  Past Medical History:  Diagnosis Date  . Arthritis   . Bradycardia   . Chronic kidney disease    enlarged prostrate  . Depression   . Dysrhythmia   . Hypertension   . Hypothyroid   . Hypothyroidism   . Pacemaker   . Parkinson disease ([place])   . Vertigo     SOCIAL HX:  Social History   Tobacco Use  . Smoking status: Former Smoker    Quit date: 08/04/1961    Years since quitting: 57.5  . Smokeless tobacco: Never Used  Substance Use Topics  . Alcohol use: No    Comment: socially    ALLERGIES:  Allergies  Allergen Reactions  . No Known Allergies   . Oxycontin [Oxycodone Hcl]   . Adhesive [Tape] Itching and Other (See Comments)    blisters     PERTINENT MEDICATIONS:  Outpatient Encounter Medications as of 01/27/2019  Medication Sig  . aspirin 81 MG chewable tablet  Chew 81 mg by mouth daily.  . bisacodyl (DULCOLAX) 5 MG EC tablet Take 1 tablet (5 mg total) by mouth daily as needed for moderate constipation. (Patient taking differently: Take 10 mg by mouth daily. )  . carbidopa-levodopa (SINEMET CR) 50-200 MG tablet Take 1 tablet by mouth 3 (three) times daily.  . cephALEXin (KEFLEX) 500 MG capsule Take 1 capsule (500 mg total) by mouth 3 (three) times daily. (Patient not taking: Reported on 12/01/2018)  . cholecalciferol (VITAMIN D3) 25 MCG (1000 UT) tablet Take  1,000 Units by mouth daily.  . clindamycin (CLEOCIN T) 1 % external solution Apply 1 application topically daily.  . ferrous sulfate 325 (65 FE) MG tablet Take 325 mg by mouth daily with breakfast.  . finasteride (PROSCAR) 5 MG tablet Take 1 tablet (5 mg total) by mouth daily.  . fluticasone (FLONASE) 50 MCG/ACT nasal spray SHAKE LIQUID AND USE 2 SPRAYS IN EACH NOSTRIL DAILY (Patient taking differently: Place 2 sprays into both nostrils daily. )  . furosemide (LASIX) 20 MG tablet Take 20 mg by mouth.  . gabapentin (NEURONTIN) 100 MG capsule Take 200 mg by mouth at bedtime.  Marland Kitchen ketoconazole (NIZORAL) 2 % shampoo Apply 1 application topically daily.  Marland Kitchen lamoTRIgine (LAMICTAL) 200 MG tablet Take 200 mg by mouth 2 (two) times daily.  Marland Kitchen levothyroxine (SYNTHROID, LEVOTHROID) 100 MCG tablet Take 100 mcg by mouth daily before breakfast.   . mirtazapine (REMERON) 15 MG tablet Take 15 mg by mouth at bedtime.  . mometasone (ELOCON) 0.1 % cream Apply 1 application topically daily. To left side of scalp and other itchy areas.  . Multiple Vitamin (MULTIVITAMIN) tablet Take 1 tablet by mouth daily.  Marland Kitchen oxyCODONE-acetaminophen (PERCOCET/ROXICET) 5-325 MG tablet Take 1 tablet by mouth every 12 (twelve) hours.  . polyethylene glycol (MIRALAX / GLYCOLAX) packet Take 17 g by mouth 2 (two) times daily.  . risperiDONE (RISPERDAL) 0.5 MG tablet Take 0.5 mg by mouth every morning.  . risperiDONE (RISPERDAL) 2 MG tablet Take 1 tablet (2 mg total) by mouth at bedtime.  Marland Kitchen rOPINIRole (REQUIP) 0.25 MG tablet Take 0.25 mg by mouth 4 (four) times daily.  Marland Kitchen senna (SENOKOT) 8.6 MG TABS tablet Take 2 tablets (17.2 mg total) by mouth 2 (two) times daily. (Patient taking differently: Take 2 tablets by mouth at bedtime. )  . sertraline (ZOLOFT) 100 MG tablet Take 150 mg by mouth daily.   . traMADol (ULTRAM) 50 MG tablet Take 1 tablet (50 mg total) by mouth 3 (three) times daily as needed for moderate pain. (Patient not taking: Reported  on 02/26/2018)   Facility-Administered Encounter Medications as of 01/27/2019  Medication  . bupivacaine (PF) (MARCAINE) 0.25 % injection 30 mL  . bupivacaine (PF) (MARCAINE) 0.25 % injection 30 mL  . ceFAZolin (ANCEF) IVPB 1 g/50 mL premix  . ceFAZolin (ANCEF) IVPB 1 g/50 mL premix  . fentaNYL (SUBLIMAZE) injection 100 mcg  . lactated ringers infusion 1,000 mL  . lactated ringers infusion 1,000 mL  . lidocaine (PF) (XYLOCAINE) 1 % injection 10 mL  . midazolam (VERSED) 5 MG/5ML injection 5 mg  . orphenadrine (NORFLEX) injection 60 mg  . orphenadrine (NORFLEX) injection 60 mg  . triamcinolone acetonide (KENALOG-40) injection 40 mg    PHYSICAL EXAM / ROS:   152/78  97.4-83-18  Current and past weights: 167 lbs. General: NAD, frail appearing, thin Cardiovascular: no chest pain reported, no edema  Pulmonary: no cough, no increased SOB Abdomen: appetite good, endorses constipation,  incontinent of bowel GU: denies dysuria, incontinent of urine MSK:  no joint deformities, ore L thumb after fall, nonambulatory, frequent falls Skin: no rashes or wounds reported, thumb sore from fall Neurological: Weakness, Parkinson's deficits, forgetful  Jason Coop, NP

## 2019-03-30 ENCOUNTER — Other Ambulatory Visit: Payer: Self-pay

## 2019-03-30 ENCOUNTER — Non-Acute Institutional Stay: Payer: Medicare Other | Admitting: Primary Care

## 2019-03-31 ENCOUNTER — Non-Acute Institutional Stay: Payer: Medicare Other | Admitting: Primary Care

## 2019-03-31 ENCOUNTER — Other Ambulatory Visit: Payer: Self-pay

## 2019-03-31 DIAGNOSIS — Z515 Encounter for palliative care: Secondary | ICD-10-CM

## 2019-03-31 NOTE — Progress Notes (Signed)
[facility] Consult Note Telephone: [phone]  Fax: [phone]  TELEHEALTH VISIT STATEMENT Due to the COVID-19 crisis, this visit was done via telemedicine from my office. It was initiated and consented to by this patient and/or family.  PATIENT NAME: Lawrence [place] [id number] [phone] (home)  DOB: 28-Dec-1931 MRN: [id number]  PRIMARY CARE PROVIDER:   Marisa Hua, MD, [place] [address] [phone]  REFERRING PROVIDER:  Marisa Hua, MD [place],  [place] [address] [phone]  RESPONSIBLE PARTY:   Extended Emergency Contact Information Primary Emergency Contact: Sundell,Jamie Address: [address]          [place], [place] [address] Johnnette Litter of [place] Phone: [phone] Relation: Spouse Secondary Emergency Contact: Muriel,Robert Address: [address] [place], [place] Phone: [phone] Relation: Son   ASSESSMENT AND RECOMMENDATIONS:   1. Advance Care Planning/Goals of Care: Goals include to maximize quality of life and symptom management.  2. Symptom Management:   Agitation: Recommend increasing hs risperidone dose to 1.5 mg and Am dose remain at 0.5 mg.Recent change in risperidone seems to have made patient more energized at hs per staff. He is staying awake until 2-3 am.   Depression: Recent increase of SSRI. Consider SNRI e.g. venlafaxine if adequate improvement not reached.    Pain: Continue to assess in light of difficulty in reporting and ongoing depression.  3. Family /Caregiver/Community Supports: [place] Lives in [place].  4. Cognitive / Functional decline: Alert, oriented x 1-2. States he is sleepy this am. In bed today where he's usually up and dressed. Reported he is not sleeping well at night. Needs assistance with most adls.  5. Follow up  Palliative Care Visit: Palliative care will continue to follow for goals of care clarification and symptom management. Return 8 weeks or prn.  I spent 25 minutes providing this consultation,  from 1030 to 1055. More than 50% of the time in this consultation was spent coordinating communication.   HISTORY OF PRESENT ILLNESS:  TAEJON RODENBERGER Sr. is a 86 y.o. year old male with multiple medical problems including PD, dementia, CKD, pacemaker. Palliative Care was asked to follow this patient by consultation request of Marisa Hua, MD to help address advance care planning and goals of care. This is a follow up visit.  CODE STATUS: DNR  PPS: 30% HOSPICE ELIGIBILITY/DIAGNOSIS: TBD  PAST MEDICAL HISTORY:  Past Medical History:  Diagnosis Date  . Arthritis   . Bradycardia   . Chronic kidney disease    enlarged prostrate  . Depression   . Dysrhythmia   . Hypertension   . Hypothyroid   . Hypothyroidism   . Pacemaker   . Parkinson disease ([place])   . Vertigo     SOCIAL HX:  Social History   Tobacco Use  . Smoking status: Former Smoker    Quit date: 08/04/1961    Years since quitting: 57.6  . Smokeless tobacco: Never Used  Substance Use Topics  . Alcohol use: No    Comment: socially    ALLERGIES:  Allergies  Allergen Reactions  . No Known Allergies   . Oxycontin [Oxycodone Hcl]   . Adhesive [Tape] Itching and Other (See Comments)    blisters     PERTINENT MEDICATIONS:  Outpatient Encounter Medications as of 03/31/2019  Medication Sig  . amLODipine (NORVASC) 2.5 MG tablet Take 2.5 mg by mouth daily.  Marland Kitchen aspirin 81 MG chewable tablet Chew 81 mg by mouth daily.  . bisacodyl (BISACODYL) 5 MG EC tablet Take 10 mg by mouth daily.  . carbidopa-levodopa (SINEMET CR) 50-200 MG tablet Take 1 tablet by mouth 3 (three) times daily. (Patient taking differently: Take 1 tablet by mouth 3 (three) times daily. Before meals)  . cholecalciferol (VITAMIN D3) 25 MCG (1000 UT) tablet Take  1,000 Units by mouth daily.  . ferrous sulfate 325 (65 FE) MG tablet Take 325 mg by mouth 2 (two) times daily with a meal.   . finasteride (PROSCAR) 5 MG tablet Take 1 tablet (5 mg total) by mouth daily.  . fluticasone (FLONASE) 50 MCG/ACT nasal spray SHAKE LIQUID AND USE 2 SPRAYS IN EACH NOSTRIL DAILY (Patient taking differently: Place 2 sprays into both nostrils daily. )  . furosemide (LASIX) 20 MG tablet Take 20 mg by mouth.  . gabapentin (NEURONTIN) 100 MG capsule Take 200 mg by mouth at bedtime.  Marland Kitchen ketoconazole (NIZORAL) 2 % shampoo Apply 1 application topically daily.  Marland Kitchen lamoTRIgine (LAMICTAL) 200 MG tablet Take 200 mg by mouth 2 (two) times daily.  Marland Kitchen levothyroxine (SYNTHROID) 112 MCG tablet Take 112 mcg by mouth daily before breakfast.  . mirtazapine (REMERON) 15 MG tablet Take 15 mg by mouth at bedtime.  Marland Kitchen oxyCODONE-acetaminophen (PERCOCET/ROXICET) 5-325 MG tablet Take 1 tablet by mouth every 12 (twelve) hours.  . polyethylene glycol (MIRALAX / GLYCOLAX) packet Take 17 g by mouth 2 (two) times daily.  . risperiDONE (RISPERDAL) 0.5 MG tablet Take 0.5 mg by mouth every morning.  . risperiDONE (RISPERDAL) 1 MG tablet Take 1 mg by mouth at bedtime.  Marland Kitchen rOPINIRole (REQUIP) 0.25 MG tablet Take 0.25 mg by mouth 4 (four) times daily.  . sertraline (ZOLOFT) 100 MG tablet Take 175 mg by mouth daily.  Marland Kitchen senna (SENOKOT) 8.6 MG TABS tablet Take 2 tablets (17.2 mg total) by mouth 2 (two) times daily. (Patient taking differently: Take 2 tablets by mouth daily. )  . [DISCONTINUED] bisacodyl (DULCOLAX) 5 MG EC tablet Take 1 tablet (5 mg total) by mouth daily as needed for moderate constipation. (Patient taking differently: Take 10 mg by mouth daily. )  . [DISCONTINUED] cephALEXin (KEFLEX) 500 MG capsule Take 1 capsule (500 mg total) by mouth 3 (three) times daily. (Patient not taking: Reported on 12/01/2018)  . [DISCONTINUED] clindamycin (CLEOCIN T) 1 % external solution Apply 1 application topically daily.    . [DISCONTINUED] levothyroxine (SYNTHROID, LEVOTHROID) 100 MCG tablet Take 100 mcg by mouth daily before breakfast.   . [DISCONTINUED] mometasone (ELOCON) 0.1 % cream Apply 1 application topically daily. To left side of scalp and other itchy areas.  . [DISCONTINUED] Multiple Vitamin (MULTIVITAMIN) tablet Take 1 tablet by mouth daily.  . [DISCONTINUED] risperiDONE (RISPERDAL) 2 MG tablet Take 1 tablet (2 mg total) by mouth at bedtime. (Patient not taking: Reported on 03/31/2019)  . [DISCONTINUED] sertraline (ZOLOFT) 100 MG tablet Take 150 mg by mouth daily.   . [DISCONTINUED] traMADol (ULTRAM) 50 MG tablet Take 1 tablet (50 mg total) by mouth 3 (three) times daily as needed for moderate pain. (Patient not taking: Reported on 02/26/2018)   Facility-Administered Encounter Medications as of 03/31/2019  Medication  . triamcinolone acetonide (KENALOG-40) injection 40 mg  . [DISCONTINUED] bupivacaine (PF) (MARCAINE) 0.25 % injection 30 mL  . [DISCONTINUED] bupivacaine (PF) (MARCAINE) 0.25 % injection 30 mL  . [  DISCONTINUED] ceFAZolin (ANCEF) IVPB 1 g/50 mL premix  . [DISCONTINUED] ceFAZolin (ANCEF) IVPB 1 g/50 mL premix  . [DISCONTINUED] fentaNYL (SUBLIMAZE) injection 100 mcg  . [DISCONTINUED] lactated ringers infusion 1,000 mL  . [DISCONTINUED] lactated ringers infusion 1,000 mL  . [DISCONTINUED] lidocaine (PF) (XYLOCAINE) 1 % injection 10 mL  . [DISCONTINUED] midazolam (VERSED) 5 MG/5ML injection 5 mg  . [DISCONTINUED] orphenadrine (NORFLEX) injection 60 mg  . [DISCONTINUED] orphenadrine (NORFLEX) injection 60 mg     PHYSICAL EXAM / ROS:   Current and past weights: 157 lbs, 2 lb gain General: NAD, frail appearing, Cardiovascular: no chest pain reported, no edema reported Pulmonary: no cough, no increased SOB , Room air Abdomen: appetite 50-75%, denies constipation, incontinent of bowel GU: denies dysuria, incontinent of urine MSK:  no joint deformities, non ambulatory, in bed today Skin: no  rashes or wounds reported Neurological: Weakness, PD, dementia, occ agitation  Jason Coop, NP [facility]

## 2019-05-10 IMAGING — CT CT CERVICAL SPINE W/O CM
3 of 5 series · 15 of 33 positions shown, 17 images · non-contrast
Comparison: Head CT dated 03/11/2017.

CLINICAL DATA: Right supraorbital hematoma after falling and
hitting his forehead this morning. Generalized weakness. Altered
mental status.

EXAM:
CT HEAD WITHOUT CONTRAST
CT CERVICAL SPINE WITHOUT CONTRAST
TECHNIQUE: Multidetector CT imaging of the head and cervical spine was
performed following the standard protocol without intravenous
contrast. Multiplanar CT image reconstructions of the cervical spine
were also generated.

[Series 5: coronal soft tissue · coronal · 0.30mm/px · 3 of 74 slices shown]
[im 28/74  bone]
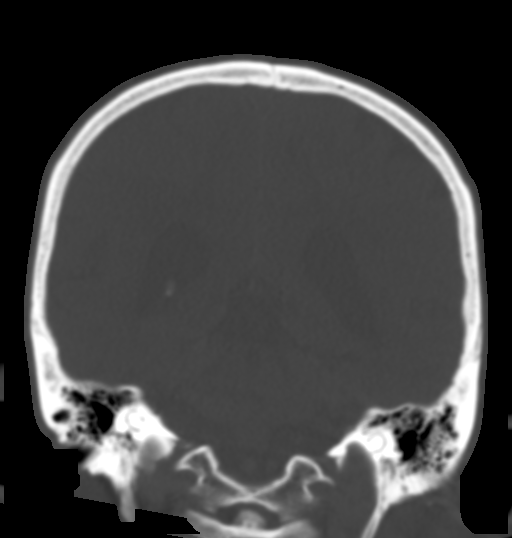
[im 34/74  bone]
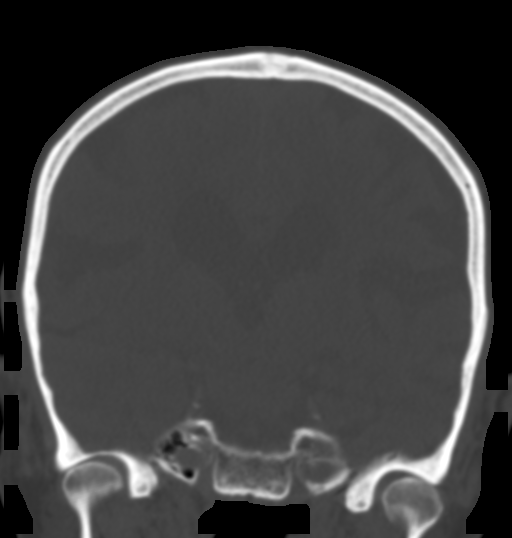
[im 40/74  bone]
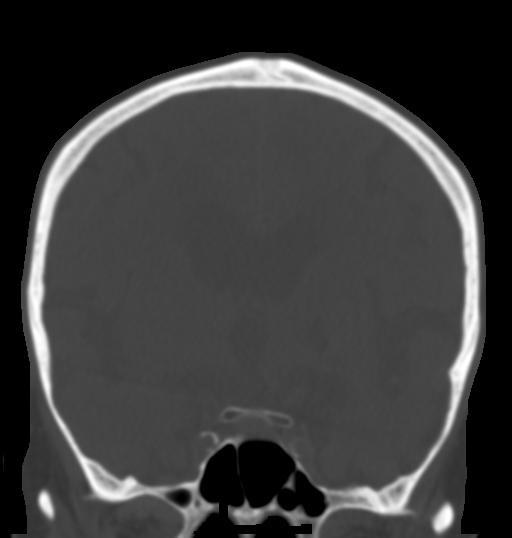

[Series 10: c spine soft · axial · 0.28mm/px · z∈[-311,-170]mm · 7 of 108 slices shown, 9 images]
[im 11/108  soft-tissue]
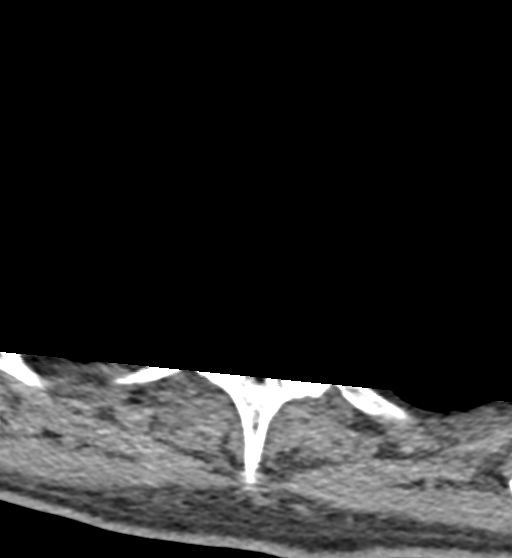
[im 11/108  bone]
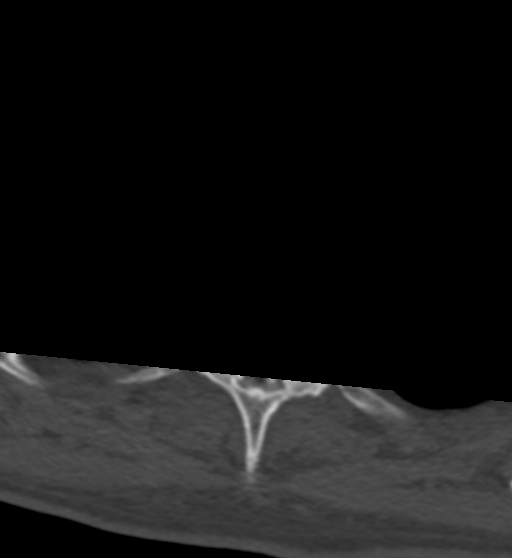
[im 22/108  bone]
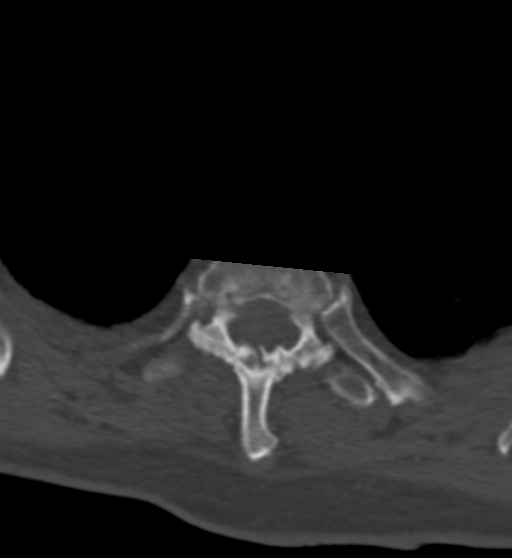
[im 43/108  bone]
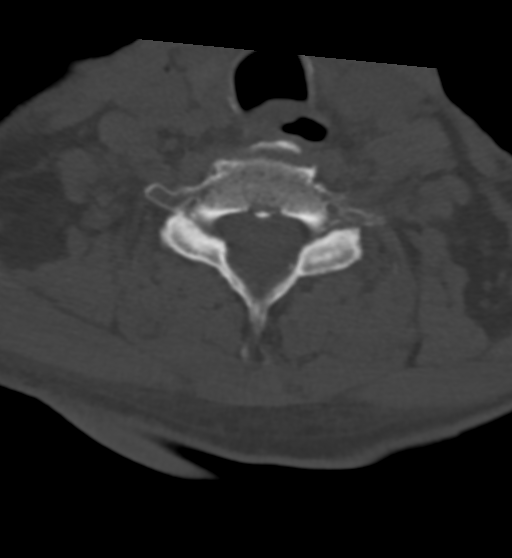
[im 54/108  bone]
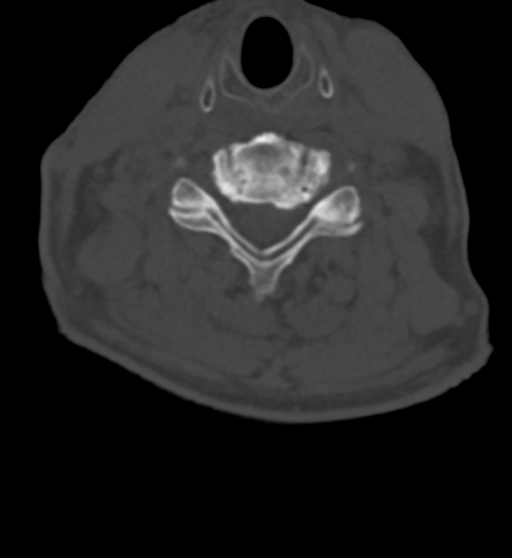
[im 65/108  soft-tissue]
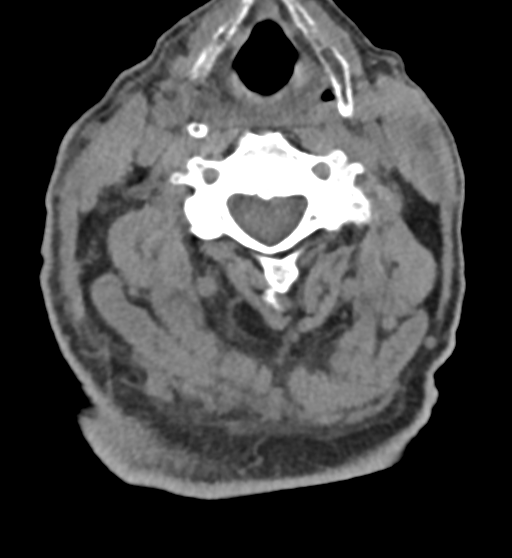
[im 65/108  bone]
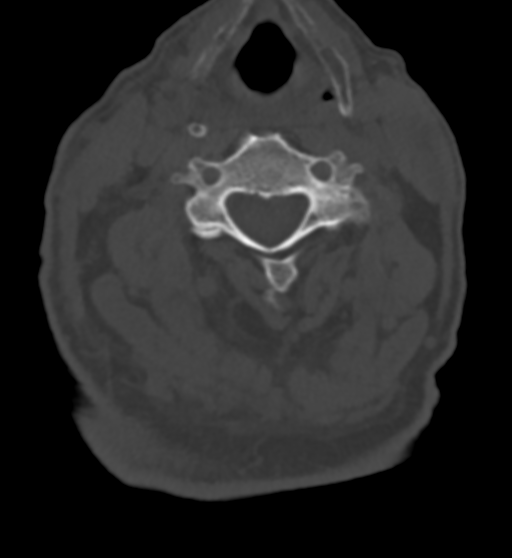
[im 86/108  bone]
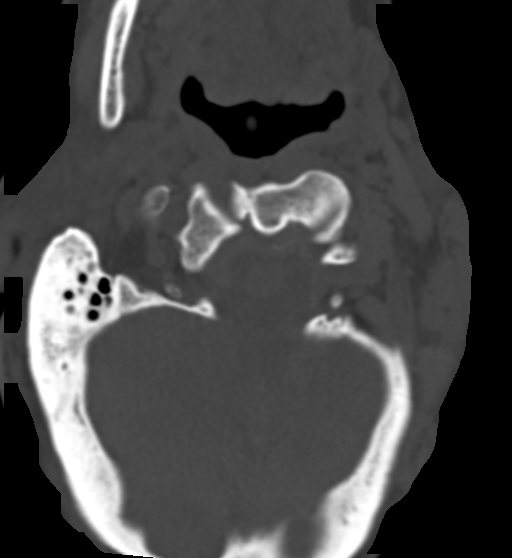
[im 97/108  bone]
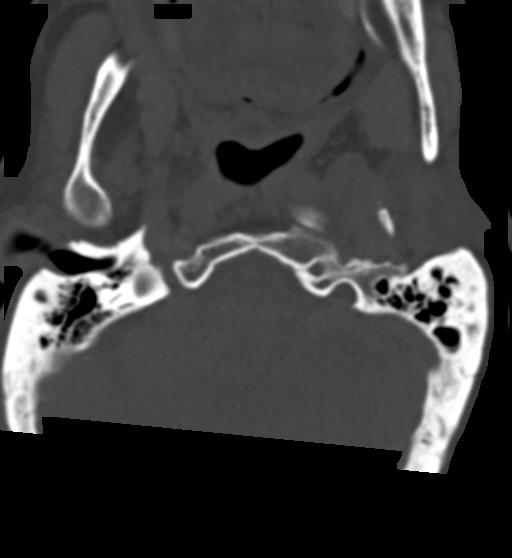

[Series 11: sagittal bone · sagittal · 0.31mm/px · 5 of 62 slices shown]
[im 11/62  bone]
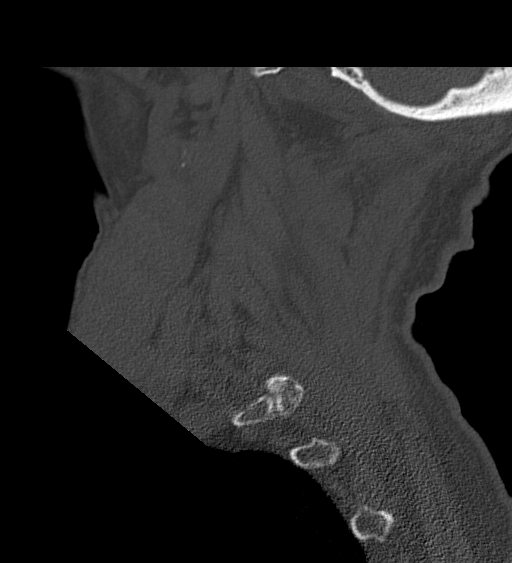
[im 21/62  bone]
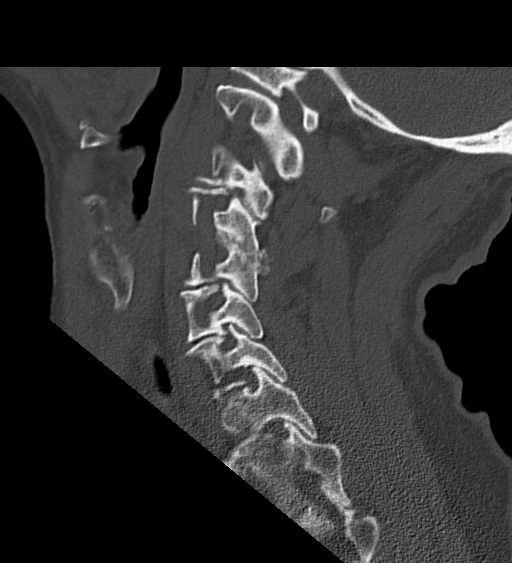
[im 31/62  bone]
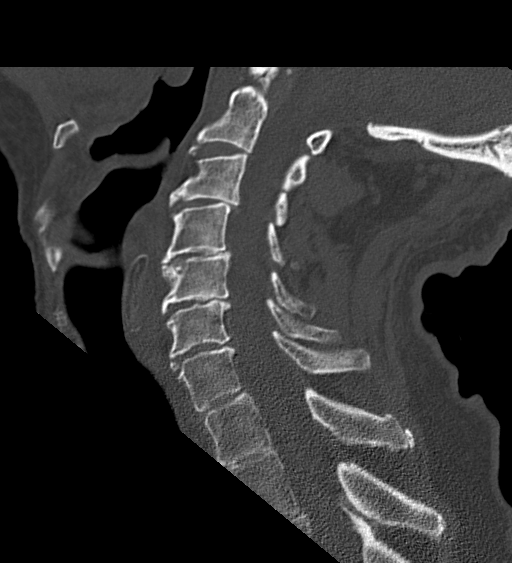
[im 41/62  bone]
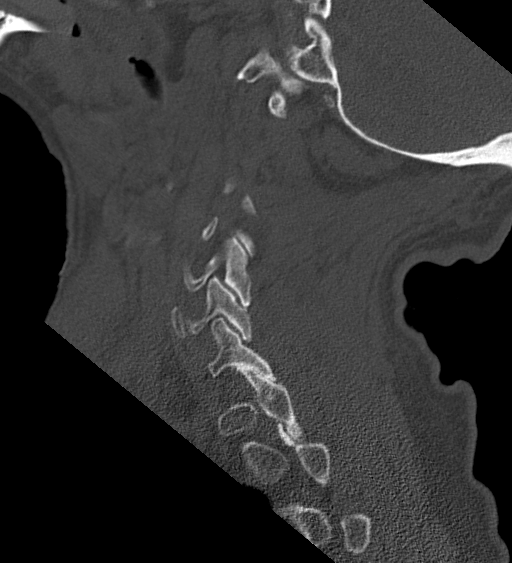
[im 51/62  bone]
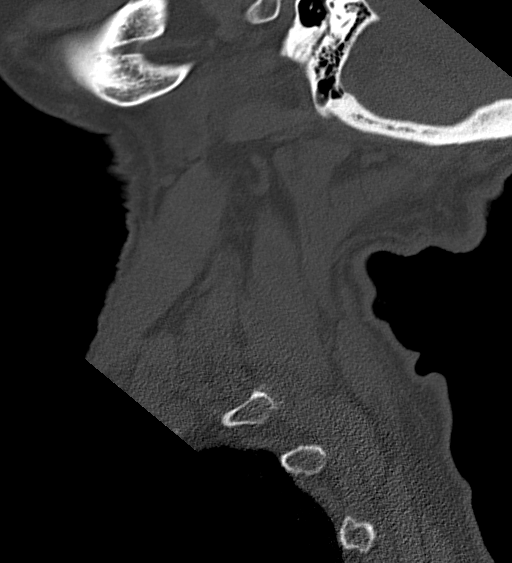

[15 of 33 positions shown; findings below may reference images not displayed]

FINDINGS: CT HEAD FINDINGS

Brain: Diffusely enlarged ventricles and subarachnoid spaces. Patchy
white matter low density in both cerebral hemispheres. Stable old
left basal ganglia lacunar infarct. No intracranial hemorrhage, mass
lesion or CT evidence of acute infarction. The previously
demonstrated subarachnoid hemorrhage has resolved.

Vascular: No hyperdense vessel or unexpected calcification.

Skull: Normal. Negative for fracture or focal lesion.

Sinuses/Orbits: Hypoplastic right maxillary sinus.

Other: Small right frontal scalp hematoma.

CT CERVICAL SPINE FINDINGS

Alignment: Normal.

Skull base and vertebrae: No acute fracture. No primary bone lesion
or focal pathologic process.

Soft tissues and spinal canal: No prevertebral fluid or swelling. No
visible canal hematoma.

Disc levels:  Multilevel degenerative changes.

Upper chest: Clear lung apices.

Other: Small amount of bilateral carotid artery calcification.
IMPRESSION: 1. Small right frontal scalp hematoma without skull fracture or
intracranial hemorrhage.
2. No cervical spine fracture or subluxation.
3. Moderate diffuse cerebral and cerebellar atrophy, chronic small
vessel white matter ischemic changes in both cerebral hemispheres
and old left basal ganglia lacunar infarct.
4. Multilevel cervical spine degenerative changes.
5. Mild bilateral carotid artery atheromatous calcifications.

## 2019-05-25 ENCOUNTER — Non-Acute Institutional Stay: Payer: Medicare Other | Admitting: Primary Care

## 2019-05-25 ENCOUNTER — Other Ambulatory Visit: Payer: Self-pay

## 2019-05-25 DIAGNOSIS — Z515 Encounter for palliative care: Secondary | ICD-10-CM

## 2019-05-25 NOTE — Progress Notes (Signed)
[facility] Consult Note Telephone: [phone]  Fax: [phone]    PATIENT NAME: Lawrence [place] [id number] (home)  DOB: 18-Apr-1931 MRN: [id number]  PRIMARY CARE PROVIDER:   Marisa Hua, MD, [place] [address] [phone]  REFERRING PROVIDER:  Marisa Hua, MD [place],  [place] [address] [phone]  RESPONSIBLE PARTY:   Extended Emergency Contact Information Primary Emergency Contact: Siglin,Jamie Address: [address]          [place], [place] [address] Johnnette Litter of [place] Phone: [phone] Relation: Spouse Secondary Emergency Contact: Haring,Robert Address: [address] [place], [place] of [place] Phone: [phone] Relation: Son   ASSESSMENT AND RECOMMENDATIONS:   1. Advance Care Planning/Goals of Care: Goals include to maximize quality of life and symptom management. DNR on file and in vynca  2. Symptom Management:   I saw [name] today in person in his room at the skilled facility. He was sitting in a wheelchair. He stated he was doing well he denied pain. He stated his appetite was good. He said he did not have any complaints currently. When I asked him what I could help him with, he said a ride home. He is still very much depressed at not seeing his family. Staff reports some increased confusion. He also has periods of constipation. Percocet was recently d/c'ed and tylenol ordered ATC.   3. Family /Caregiver/Community Supports: Wife lives out of town, pt lives in [place].  4. Cognitive / Functional decline:  Alert, oriented x 2. Needs help with most adls due to parkinsons disease.  5. Follow up Palliative Care Visit: Palliative care will continue to follow for goals of care clarification and symptom management. Return 4-6 weeks or prn.  I spent 25 minutes providing this consultation,   from 1500 to 1525. More than 50% of the time in this consultation was spent coordinating communication.   HISTORY OF PRESENT ILLNESS:  Lawrence GARG Sr. is a 86 y.o. year old male with multiple medical problems including PD, dementia, CKD, pacemaker. Palliative Care was asked to follow this patient by consultation request of Marisa Hua, MD to help address advance care planning and goals of care. This is a follow up visit.  CODE STATUS: DNR  PPS: 30% HOSPICE ELIGIBILITY/DIAGNOSIS: TBD  PAST MEDICAL HISTORY:  Past Medical History:  Diagnosis Date  . Arthritis   . Bradycardia   . Chronic kidney disease    enlarged prostrate  . Depression   . Dysrhythmia   . Hypertension   . Hypothyroid   . Hypothyroidism   . Pacemaker   . Parkinson disease ([place])   . Vertigo     SOCIAL HX:  Social History   Tobacco Use  . Smoking status: Former Smoker    Quit date: 08/04/1961    Years since quitting: 57.8  . Smokeless tobacco: Never Used  Substance Use Topics  . Alcohol use: No    Comment: socially    ALLERGIES:  Allergies  Allergen Reactions  . Oxycontin [Oxycodone Hcl]   . Adhesive [Tape] Itching and Other (See Comments)    blisters      PERTINENT MEDICATIONS:  Outpatient Encounter Medications as of 05/25/2019  Medication Sig  . acetaminophen (TYLENOL) 325 MG tablet Take 650 mg by mouth every 6 (six) hours.  Marland Kitchen amLODipine (NORVASC) 2.5 MG  tablet Take 2.5 mg by mouth daily.  Marland Kitchen aspirin 81 MG chewable tablet Chew 81 mg by mouth daily.  . bisacodyl (BISACODYL) 5 MG EC tablet Take 10 mg by mouth daily.  . carbidopa-levodopa (SINEMET CR) 50-200 MG tablet Take 1 tablet by mouth 3 (three) times daily. (Patient taking differently: Take 1 tablet by mouth 3 (three) times daily. Before meals. DO NOT CRUSH)  . cholecalciferol (VITAMIN D3) 25 MCG (1000 UT) tablet Take 1,000 Units by mouth daily.  . ferrous sulfate 325 (65 FE) MG tablet Take 325 mg by mouth daily with breakfast.   .  finasteride (PROSCAR) 5 MG tablet Take 1 tablet (5 mg total) by mouth daily.  . fluticasone (FLONASE) 50 MCG/ACT nasal spray SHAKE LIQUID AND USE 2 SPRAYS IN EACH NOSTRIL DAILY (Patient taking differently: Place 2 sprays into both nostrils daily. )  . furosemide (LASIX) 20 MG tablet Take 20 mg by mouth.  . gabapentin (NEURONTIN) 100 MG capsule Take 200 mg by mouth at bedtime.  Marland Kitchen ketoconazole (NIZORAL) 2 % shampoo Apply 1 application topically daily.  Marland Kitchen lamoTRIgine (LAMICTAL) 200 MG tablet Take 200 mg by mouth 2 (two) times daily.  Marland Kitchen levothyroxine (SYNTHROID) 125 MCG tablet Take 125 mcg by mouth daily before breakfast.   . polyethylene glycol (MIRALAX / GLYCOLAX) packet Take 17 g by mouth 2 (two) times daily.  . risperiDONE (RISPERDAL) 0.5 MG tablet Take 0.5 mg by mouth every morning.  . risperiDONE (RISPERDAL) 1 MG tablet Take 1 mg by mouth at bedtime.  Marland Kitchen rOPINIRole (REQUIP) 0.25 MG tablet Take 0.25 mg by mouth 4 (four) times daily.  Marland Kitchen senna (SENOKOT) 8.6 MG TABS tablet Take 2 tablets (17.2 mg total) by mouth 2 (two) times daily. (Patient taking differently: Take by mouth 2 (two) times daily. )  . sertraline (ZOLOFT) 100 MG tablet Take 175 mg by mouth daily.  . [DISCONTINUED] mirtazapine (REMERON) 15 MG tablet Take 15 mg by mouth at bedtime.  . [DISCONTINUED] oxyCODONE-acetaminophen (PERCOCET/ROXICET) 5-325 MG tablet Take 1 tablet by mouth every 12 (twelve) hours.   Facility-Administered Encounter Medications as of 05/25/2019  Medication  . triamcinolone acetonide (KENALOG-40) injection 40 mg     PHYSICAL EXAM / ROS:  149/80 Current and past weights:  169 lb, weight gain General: NAD, frail appearing, WNWD Cardiovascular: no chest pain reported, no edema, Pulmonary: no cough, no increased SOB, room air Abdomen: appetite good, 50-75%, endorses constipation, incontinent of bowel GU: denies dysuria, incontinent of urine MSK:  no joint deformities, non ambulatory, in w/c Skin: no rashes or  wounds reported Neurological: Weakness, denies pain. Right hand tremor  Jason Coop, NP [facility]  COVID-19 PATIENT SCREENING TOOL  Person answering questions: ____________Staff_______ _____   1.  Is the patient or any family member in the home showing any signs or symptoms regarding respiratory infection?               Person with Symptom- __________NA_________________  a. Fever                                                                          Yes___ No___          ___________________  b. Shortness of breath  Yes___ No___          ___________________ c. Cough/congestion                                       Yes___  No___         ___________________ d. Body aches/pains                                                         Yes___ No___        ____________________ e. Gastrointestinal symptoms (diarrhea, nausea)           Yes___ No___        ____________________  2. Within the past 14 days, has anyone living in the home had any contact with someone with or under investigation for COVID-19?    Yes___ No_X_   Person __________________

## 2019-10-22 DEATH — deceased
